# Patient Record
Sex: Female | Born: 1950
Health system: Southern US, Community
[De-identification: ages and names within clinical notes are randomized; demographics above are authoritative.]

## PROBLEM LIST (undated history)

## (undated) DIAGNOSIS — N6019 Diffuse cystic mastopathy of unspecified breast: Secondary | ICD-10-CM

## (undated) DIAGNOSIS — R4586 Emotional lability: Secondary | ICD-10-CM

## (undated) DIAGNOSIS — F419 Anxiety disorder, unspecified: Secondary | ICD-10-CM

## (undated) DIAGNOSIS — E119 Type 2 diabetes mellitus without complications: Secondary | ICD-10-CM

## (undated) HISTORY — PX: TONSILLECTOMY: SUR1361

## (undated) HISTORY — DX: Anxiety disorder, unspecified: F41.9

## (undated) HISTORY — DX: Diffuse cystic mastopathy of unspecified breast: N60.19

## (undated) HISTORY — DX: Emotional lability: R45.86

## (undated) HISTORY — DX: Type 2 diabetes mellitus without complications: E11.9

---
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
## 1987-07-07 HISTORY — PX: BREAST BIOPSY: SHX20

## 1989-07-06 HISTORY — PX: FOOT SURGERY: SHX648

## 1998-10-18 ENCOUNTER — Encounter: Payer: Self-pay | Admitting: Obstetrics and Gynecology

## 1998-10-18 ENCOUNTER — Ambulatory Visit (HOSPITAL_COMMUNITY): Admission: RE | Admit: 1998-10-18 | Discharge: 1998-10-18 | Payer: Self-pay | Admitting: Obstetrics and Gynecology

## 1998-12-25 ENCOUNTER — Other Ambulatory Visit: Admission: RE | Admit: 1998-12-25 | Discharge: 1998-12-25 | Payer: Self-pay | Admitting: Orthopedic Surgery

## 1999-06-12 ENCOUNTER — Encounter: Admission: RE | Admit: 1999-06-12 | Discharge: 1999-06-12 | Payer: Self-pay | Admitting: *Deleted

## 1999-06-12 ENCOUNTER — Encounter: Payer: Self-pay | Admitting: *Deleted

## 1999-10-22 ENCOUNTER — Encounter: Payer: Self-pay | Admitting: Family Medicine

## 1999-10-22 ENCOUNTER — Other Ambulatory Visit: Admission: RE | Admit: 1999-10-22 | Discharge: 1999-10-22 | Payer: Self-pay | Admitting: Family Medicine

## 1999-10-22 ENCOUNTER — Ambulatory Visit (HOSPITAL_COMMUNITY): Admission: RE | Admit: 1999-10-22 | Discharge: 1999-10-22 | Payer: Self-pay | Admitting: Family Medicine

## 2000-11-18 ENCOUNTER — Other Ambulatory Visit: Admission: RE | Admit: 2000-11-18 | Discharge: 2000-11-18 | Payer: Self-pay | Admitting: Family Medicine

## 2003-04-27 ENCOUNTER — Inpatient Hospital Stay (HOSPITAL_COMMUNITY): Admission: EM | Admit: 2003-04-27 | Discharge: 2003-04-29 | Payer: Self-pay | Admitting: Psychiatry

## 2003-06-12 ENCOUNTER — Encounter: Admission: RE | Admit: 2003-06-12 | Discharge: 2003-06-12 | Payer: Self-pay | Admitting: Family Medicine

## 2003-06-19 ENCOUNTER — Ambulatory Visit (HOSPITAL_COMMUNITY): Admission: RE | Admit: 2003-06-19 | Discharge: 2003-06-19 | Payer: Self-pay | Admitting: Family Medicine

## 2003-07-07 HISTORY — PX: THYROIDECTOMY: SHX17

## 2003-08-13 ENCOUNTER — Encounter (INDEPENDENT_AMBULATORY_CARE_PROVIDER_SITE_OTHER): Payer: Self-pay | Admitting: Specialist

## 2003-08-13 ENCOUNTER — Ambulatory Visit (HOSPITAL_COMMUNITY): Admission: RE | Admit: 2003-08-13 | Discharge: 2003-08-13 | Payer: Self-pay | Admitting: Endocrinology

## 2003-10-25 ENCOUNTER — Encounter (INDEPENDENT_AMBULATORY_CARE_PROVIDER_SITE_OTHER): Payer: Self-pay | Admitting: Specialist

## 2003-10-25 ENCOUNTER — Observation Stay (HOSPITAL_COMMUNITY): Admission: RE | Admit: 2003-10-25 | Discharge: 2003-10-26 | Payer: Self-pay | Admitting: Surgery

## 2004-01-04 ENCOUNTER — Other Ambulatory Visit: Admission: RE | Admit: 2004-01-04 | Discharge: 2004-01-04 | Payer: Self-pay | Admitting: Obstetrics and Gynecology

## 2004-07-16 ENCOUNTER — Ambulatory Visit: Payer: Self-pay | Admitting: Family Medicine

## 2005-01-13 ENCOUNTER — Other Ambulatory Visit: Admission: RE | Admit: 2005-01-13 | Discharge: 2005-01-13 | Payer: Self-pay | Admitting: Obstetrics and Gynecology

## 2005-05-14 ENCOUNTER — Ambulatory Visit: Payer: Self-pay | Admitting: Family Medicine

## 2006-01-20 ENCOUNTER — Other Ambulatory Visit: Admission: RE | Admit: 2006-01-20 | Discharge: 2006-01-20 | Payer: Self-pay | Admitting: Obstetrics and Gynecology

## 2006-07-07 ENCOUNTER — Ambulatory Visit: Payer: Self-pay | Admitting: Family Medicine

## 2006-07-07 LAB — CONVERTED CEMR LAB
Free T4: 0.6 ng/dL (ref 0.6–1.6)
T3, Free: 2.6 pg/mL (ref 2.3–4.2)
TSH: 1.48 microintl units/mL (ref 0.35–5.50)

## 2007-06-18 ENCOUNTER — Encounter: Payer: Self-pay | Admitting: Family Medicine

## 2007-07-04 ENCOUNTER — Ambulatory Visit: Payer: Self-pay | Admitting: Internal Medicine

## 2007-07-04 DIAGNOSIS — E890 Postprocedural hypothyroidism: Secondary | ICD-10-CM | POA: Insufficient documentation

## 2007-07-04 DIAGNOSIS — Z9009 Acquired absence of other part of head and neck: Secondary | ICD-10-CM

## 2007-07-04 DIAGNOSIS — R51 Headache: Secondary | ICD-10-CM

## 2007-07-04 DIAGNOSIS — N943 Premenstrual tension syndrome: Secondary | ICD-10-CM | POA: Insufficient documentation

## 2007-07-04 DIAGNOSIS — L408 Other psoriasis: Secondary | ICD-10-CM

## 2007-07-04 DIAGNOSIS — F411 Generalized anxiety disorder: Secondary | ICD-10-CM | POA: Insufficient documentation

## 2007-07-04 DIAGNOSIS — F329 Major depressive disorder, single episode, unspecified: Secondary | ICD-10-CM

## 2007-07-04 DIAGNOSIS — R519 Headache, unspecified: Secondary | ICD-10-CM | POA: Insufficient documentation

## 2007-07-04 DIAGNOSIS — G2581 Restless legs syndrome: Secondary | ICD-10-CM

## 2007-07-08 LAB — CONVERTED CEMR LAB
AST: 29 units/L (ref 0–37)
BUN: 15 mg/dL (ref 6–23)
Basophils Relative: 1.6 % — ABNORMAL HIGH (ref 0.0–1.0)
CO2: 26 meq/L (ref 19–32)
Calcium: 9.3 mg/dL (ref 8.4–10.5)
Creatinine, Ser: 0.8 mg/dL (ref 0.4–1.2)
Monocytes Relative: 7.1 % (ref 3.0–11.0)
Platelets: 283 10*3/uL (ref 150–400)
RBC: 4.45 M/uL (ref 3.87–5.11)
RDW: 12.3 % (ref 11.5–14.6)
Sed Rate: 11 mm/hr (ref 0–25)

## 2007-07-13 ENCOUNTER — Ambulatory Visit: Payer: Self-pay | Admitting: Cardiovascular Disease

## 2007-07-15 ENCOUNTER — Telehealth: Payer: Self-pay | Admitting: Internal Medicine

## 2007-07-25 ENCOUNTER — Ambulatory Visit: Payer: Self-pay | Admitting: Internal Medicine

## 2007-09-17 ENCOUNTER — Encounter: Payer: Self-pay | Admitting: Internal Medicine

## 2007-09-21 ENCOUNTER — Encounter (INDEPENDENT_AMBULATORY_CARE_PROVIDER_SITE_OTHER): Payer: Self-pay | Admitting: *Deleted

## 2007-09-21 ENCOUNTER — Encounter: Payer: Self-pay | Admitting: Internal Medicine

## 2007-11-09 ENCOUNTER — Telehealth (INDEPENDENT_AMBULATORY_CARE_PROVIDER_SITE_OTHER): Payer: Self-pay | Admitting: *Deleted

## 2008-09-24 ENCOUNTER — Emergency Department (HOSPITAL_COMMUNITY): Admission: EM | Admit: 2008-09-24 | Discharge: 2008-09-24 | Payer: Self-pay | Admitting: Emergency Medicine

## 2008-11-19 ENCOUNTER — Encounter: Payer: Self-pay | Admitting: Internal Medicine

## 2008-12-06 ENCOUNTER — Encounter (INDEPENDENT_AMBULATORY_CARE_PROVIDER_SITE_OTHER): Payer: Self-pay | Admitting: *Deleted

## 2009-01-02 ENCOUNTER — Encounter: Payer: Self-pay | Admitting: Internal Medicine

## 2009-01-02 ENCOUNTER — Ambulatory Visit: Payer: Self-pay | Admitting: Internal Medicine

## 2009-01-02 ENCOUNTER — Other Ambulatory Visit: Admission: RE | Admit: 2009-01-02 | Discharge: 2009-01-02 | Payer: Self-pay | Admitting: Internal Medicine

## 2009-01-03 ENCOUNTER — Ambulatory Visit: Payer: Self-pay | Admitting: Internal Medicine

## 2009-01-03 ENCOUNTER — Encounter (INDEPENDENT_AMBULATORY_CARE_PROVIDER_SITE_OTHER): Payer: Self-pay | Admitting: *Deleted

## 2009-01-08 ENCOUNTER — Ambulatory Visit: Payer: Self-pay | Admitting: Internal Medicine

## 2009-01-08 LAB — CONVERTED CEMR LAB: Fecal Occult Bld: POSITIVE

## 2009-01-09 ENCOUNTER — Telehealth: Payer: Self-pay | Admitting: Internal Medicine

## 2009-01-10 LAB — CONVERTED CEMR LAB
AST: 95 units/L — ABNORMAL HIGH (ref 0–37)
BUN: 13 mg/dL (ref 6–23)
Basophils Relative: 0.4 % (ref 0.0–3.0)
CO2: 28 meq/L (ref 19–32)
Chloride: 105 meq/L (ref 96–112)
Cholesterol: 212 mg/dL — ABNORMAL HIGH (ref 0–200)
Creatinine, Ser: 0.8 mg/dL (ref 0.4–1.2)
Eosinophils Absolute: 0.3 10*3/uL (ref 0.0–0.7)
HCT: 40.6 % (ref 36.0–46.0)
Lymphs Abs: 2.1 10*3/uL (ref 0.7–4.0)
MCHC: 35 g/dL (ref 30.0–36.0)
MCV: 88.9 fL (ref 78.0–100.0)
Monocytes Absolute: 0.6 10*3/uL (ref 0.1–1.0)
Neutrophils Relative %: 57.6 % (ref 43.0–77.0)
Potassium: 3.8 meq/L (ref 3.5–5.1)
RBC: 4.57 M/uL (ref 3.87–5.11)
Total CHOL/HDL Ratio: 3
Vit D, 25-Hydroxy: 38 ng/mL (ref 30–89)

## 2009-01-11 ENCOUNTER — Encounter: Payer: Self-pay | Admitting: Internal Medicine

## 2009-01-14 ENCOUNTER — Telehealth (INDEPENDENT_AMBULATORY_CARE_PROVIDER_SITE_OTHER): Payer: Self-pay | Admitting: *Deleted

## 2009-01-17 ENCOUNTER — Ambulatory Visit: Payer: Self-pay | Admitting: Internal Medicine

## 2009-01-22 ENCOUNTER — Encounter: Payer: Self-pay | Admitting: Family Medicine

## 2009-01-22 ENCOUNTER — Ambulatory Visit: Payer: Self-pay | Admitting: Internal Medicine

## 2009-01-25 ENCOUNTER — Telehealth (INDEPENDENT_AMBULATORY_CARE_PROVIDER_SITE_OTHER): Payer: Self-pay | Admitting: *Deleted

## 2009-01-25 LAB — CONVERTED CEMR LAB
HCV Ab: NEGATIVE
Hep B E Ab: NEGATIVE

## 2009-01-29 LAB — CONVERTED CEMR LAB
ALT: 20 units/L (ref 0–35)
AST: 26 units/L (ref 0–37)
Alkaline Phosphatase: 82 units/L (ref 39–117)
Bilirubin, Direct: 0 mg/dL (ref 0.0–0.3)
Total Bilirubin: 0.7 mg/dL (ref 0.3–1.2)

## 2009-01-31 ENCOUNTER — Ambulatory Visit: Payer: Self-pay | Admitting: Internal Medicine

## 2009-02-01 ENCOUNTER — Encounter (INDEPENDENT_AMBULATORY_CARE_PROVIDER_SITE_OTHER): Payer: Self-pay | Admitting: *Deleted

## 2009-02-21 ENCOUNTER — Encounter (INDEPENDENT_AMBULATORY_CARE_PROVIDER_SITE_OTHER): Payer: Self-pay | Admitting: *Deleted

## 2010-02-06 ENCOUNTER — Telehealth: Payer: Self-pay | Admitting: Internal Medicine

## 2010-03-19 ENCOUNTER — Ambulatory Visit: Payer: Self-pay | Admitting: Internal Medicine

## 2010-04-08 ENCOUNTER — Encounter: Payer: Self-pay | Admitting: Internal Medicine

## 2010-06-13 ENCOUNTER — Telehealth (INDEPENDENT_AMBULATORY_CARE_PROVIDER_SITE_OTHER): Payer: Self-pay | Admitting: *Deleted

## 2010-08-05 NOTE — Assessment & Plan Note (Signed)
Summary: cpx/kn/rescd cbs   Vital Signs:  Patient profile:   60 year old female Height:      64 inches Weight:      162.13 pounds BMI:     27.93 Pulse rate:   88 / minute Pulse rhythm:   regular BP sitting:   122 / 80  (left arm) Cuff size:   regular  Vitals Entered By: Army Fossa CMA (March 19, 2010 1:56 PM) CC: CPX? Med Refill? not fasting Comments Pt does not want the CPX, would rather just be seen for Med Refill. Declines PAP. Declines Flu shot. Due for mammogram. Refill on Prozac- uses [facility].    History of Present Illness: as above   Preventive Screening-Counseling & Management  Alcohol-Tobacco     Alcohol type: rare  Current Medications (verified): 1)  Prozac 40 Mg  Caps (Fluoxetine Hcl) .Marland Kitchen.. 1 By Mouth Qd  Allergies (verified): No Known Drug Allergies  Past History:  Past Medical History: Reviewed history from 01/02/2009 and no changes required. MENSTRUAL MIGRAINES RESTLESS LEGS Anxiety-Depression ("I take prozac for mood swings") PSORIASIS menopause since age 42  Past Surgical History: Reviewed history from 07/04/2007 and no changes required. Thyroidectomy RT LOBE Tonsillectomy Morton's neuroma 2000 Breast Bx '89  Social History: Reviewed history from 01/02/2009 and no changes required. Married 1 child Never Smoked Alcohol use- rarely  Drug use-no Regular exercise- no caffeine use - 3 cups daily  Review of Systems Psych:  Denies depression; has RLS, occ diff. falling asleeps  , once she sleeps she is ok  on prozac x years, in the past she tried to decreased dose ( to  every other day  or skipping weeksends) but got anxious ; she still would like to d/c it, no major stressors on her life .  Physical Exam  General:  alert and well-developed.   Lungs:  normal respiratory effort, no intercostal retractions, no accessory muscle use, and normal breath sounds.   Heart:  normal rate, regular rhythm, no murmur, and no gallop.   Psych:   Oriented X3, memory intact for recent and remote, normally interactive, good eye contact, not anxious appearing, and not depressed appearing.     Impression & Recommendations:  Problem # 1:  ANXIETY (ICD-300.00) see review of systems We agreed to decrease fluoxetine dose (40 to 30mg ) and reassess her  condition in 6 months Her updated medication list for this problem includes:    Fluoxetine Hcl 20 Mg Tabs (Fluoxetine hcl) .Marland Kitchen... 1.5 tabs a day  Problem # 2:  HEALTH SCREENING (ICD-V70.0) benefits of flu shot discussed, no h/o allergic reations-- states she will take it also rec a MMG, states will schedule one RTC 6 months, we agreed to do her CPX then   Problem # 3:  RESTLESS LEG SYNDROME (ICD-333.94) not  interested in any treatment at this point  Complete Medication List: 1)  Fluoxetine Hcl 20 Mg Tabs (Fluoxetine hcl) .... 1.5 tabs a day  Other Orders: Admin 1st Vaccine (16109) Flu Vaccine 72yrs + (60454)  Patient Instructions: 1)  Please schedule a follow-up appointment in 6 months .  Prescriptions: FLUOXETINE HCL 20 MG TABS (FLUOXETINE HCL) 1.5 tabs a day  #135 x 1   Entered by:   Army Fossa CMA   Authorized by:   Nolon Rod. Paz MD   Signed by:   Army Fossa CMA on 03/19/2010   Method used:   Electronically to        [facility] (retail)             ,  Ph: [phone]       Fax: [phone]   RxID:   [id number]    Risk Factors:  Alcohol use:  yes    Type:  rare Flu Vaccine Consent Questions     Do you have a history of severe allergic reactions to this vaccine? no    Any prior history of allergic reactions to egg and/or gelatin? no    Do you have a sensitivity to the preservative Thimersol? no    Do you have a past history of Guillan-Barre Syndrome? no    Do you currently have an acute febrile illness? no    Have you ever had a severe reaction to latex? no    Vaccine information given and explained to patient? yes    Are you currently  pregnant? no    Lot Number:[id number]   Exp Date:01/03/2011   Site Given  Left Deltoid IM use:  yes    Type:  rare    .lbflu

## 2010-08-05 NOTE — Progress Notes (Signed)
Summary: Refill Request  Phone Note Refill Request Message from:  Patient on February 06, 2010 9:22 AM  Refills Requested: Medication #1:  PROZAC 40 MG  CAPS 1 by mouth QD   Dosage confirmed as above?Dosage Confirmed   Supply Requested: 1 month [facility]. Pt can be contacted at [phone]  Next Appointment Scheduled: none Initial call taken by: Lavell Islam,  February 06, 2010 9:23 AM  Follow-up for Phone Call        not been seen since June 2010, no pending appts. Tried to call pt to schedule appt, had to leave a message.Army Fossa CMA  February 06, 2010 9:43 AM   Additional Follow-up for Phone Call Additional follow up Details #1::        pt has an appt for a complete physical on 02/26/10 @ 12:40. pt states she will come in fasting.Karoline Caldwell Negrete  February 06, 2010 11:45 AM    Additional Follow-up for Phone Call Additional follow up Details #2::    okay to call Prozac #90 tablets no refill Follow-up by: Nolon Rod. Arriana Lohmann MD,  February 06, 2010 3:50 PM  Prescriptions: PROZAC 40 MG  CAPS (FLUOXETINE HCL) 1 by mouth QD  #90 x 0   Entered by:   Army Fossa CMA   Authorized by:   Nolon Rod. Latresha Yahr MD   Signed by:   Army Fossa CMA on 02/06/2010   Method used:   Faxed to ...       [facility] (mail-order)             , [place]         Ph: [phone]       Fax: [phone]   RxID:   [id number]

## 2010-08-07 NOTE — Progress Notes (Signed)
Summary: medical records  Phone Note Call from Patient Call back at Home Phone [phone]   Caller: Patient Summary of Call: Pt states that she is getting new health coverage so her medical records are needed. Pt indicates that [employer] insurance should have sent a letter over requesting medical records.  Pt would like for [place] to response to this request ASAP because she is to leave the country on next Friday. Pls advise if any request have been received and let pt know................Marland KitchenFelecia Deloach CMA  June 13, 2010 10:49 AM   Checked medical records, no records have been requested...............Marland KitchenFelecia Deloach CMA  June 13, 2010 10:59 AM   Follow-up for Phone Call        I have not seen any letters for the pt. Army Fossa CMA  June 13, 2010 11:01 AM   Additional Follow-up for Phone Call Additional follow up Details #1::        Discuss with patient husband will inform pt. Per pt husband pt will be in touch with insurance to check status of letter and call us back with any further concerns .........Marland KitchenFelecia Deloach CMA  June 13, 2010 11:07 AM      Appended Document: medical records    Phone Note Call from Patient   Caller: other Call placed by: Kalyn Call placed to: Patient Summary of Call: At the time that the request was sent from [facility], we did not have the chart here. The chart is now back from [facility]..( I assume there was a previous request beofre this one.)I spoke with Misty Stanley at [employer] and she told me to send it over and to let the patient know that they could pick it up once it has been scanned. Left Message on pts home phone to call the office ASAP.  Follow-up for Phone Call        Spoke with Renee at [facility]. She advised me that she left a message with the patient letting her know that she could pick up her chart. Follow-up by: Lavell Islam,  June 26, 2010 2:40 PM

## 2010-09-05 ENCOUNTER — Ambulatory Visit (INDEPENDENT_AMBULATORY_CARE_PROVIDER_SITE_OTHER): Payer: BC Managed Care – PPO | Admitting: Internal Medicine

## 2010-09-05 ENCOUNTER — Encounter: Payer: Self-pay | Admitting: Internal Medicine

## 2010-09-05 DIAGNOSIS — L03319 Cellulitis of trunk, unspecified: Secondary | ICD-10-CM

## 2010-09-06 ENCOUNTER — Encounter: Payer: Self-pay | Admitting: Internal Medicine

## 2010-09-08 ENCOUNTER — Encounter: Payer: Self-pay | Admitting: Internal Medicine

## 2010-09-08 ENCOUNTER — Ambulatory Visit (INDEPENDENT_AMBULATORY_CARE_PROVIDER_SITE_OTHER): Payer: BC Managed Care – PPO | Admitting: Internal Medicine

## 2010-09-08 DIAGNOSIS — L03319 Cellulitis of trunk, unspecified: Secondary | ICD-10-CM

## 2010-09-11 NOTE — Assessment & Plan Note (Signed)
Summary: [place] area, possible cyst needing drainage./kb   Vital Signs:  Patient profile:   60 year old female Weight:      159.38 pounds Pulse rate:   78 / minute Pulse rhythm:   regular BP sitting:   126 / 84  (left arm) Cuff size:   regular  Vitals Entered By: Army Fossa CMA (September 05, 2010 1:06 PM) CC: Cyst on (L) shoulder x 1 1/2 weeks  Comments [facility] [address]    History of Present Illness: swelling and pain x 10 days in the L trapezoid area she had a mole removed from there  ~10 years ago, the surgery left a scar which was removed  ~ 5 years ago (per patient). The area looked ok until few days ago  ROS no fever no d/c   Current Medications (verified): 1)  Fluoxetine Hcl 20 Mg Tabs (Fluoxetine Hcl) .... 1.5 Tabs A Day  Allergies (verified): No Known Drug Allergies  Past History:  Past Medical History: Reviewed history from 01/02/2009 and no changes required. MENSTRUAL MIGRAINES RESTLESS LEGS Anxiety-Depression ("I take prozac for mood swings") PSORIASIS menopause since age 9  Past Surgical History: Reviewed history from 07/04/2007 and no changes required. Thyroidectomy RT LOBE Tonsillectomy Morton's neuroma 2000 Breast Bx '89  Physical Exam  General:  alert, well-developed, and well-nourished.   Skin:  at the L trapezoid area has a 1 inch mass consistent w/ an abscess, ++ fluctuancy. Skin is stretchecd but no lesions    Impression & Recommendations:  Problem # 1:  procedure note in a sterile fashion, LA with lidocaine 2% w/o  ~  1cc we did a 1 cm incision, got  ~10cc of purulent fatty material; cyst wall identified it took several minutes to get all the abundant infection and sebaceous material out We packed the cavity culture sent Rx doxy know to put pressure if bleeding see instructions  ( the patient reports that she had a mole years ago in the same area, see HPI, the area will have to be reassessed once he is completely healed  )  Complete Medication List: 1)  Fluoxetine Hcl 20 Mg Tabs (Fluoxetine hcl) .... 1.5 tabs a day 2)  Doxycycline Hyclate 100 Mg Caps (Doxycycline hyclate) .Marland Kitchen.. 1 by mouth two times a day  Other Orders: T-Culture, Wound (87070/[id number]) I&D Abscess, Complex (10061)  Patient Instructions: 1)  keep area clean and dry 2)  take the antibiotics as prescribed 3)  came back Monday 4)  call ot go to a Urgent Care if fever, redness, area look worse Prescriptions: DOXYCYCLINE HYCLATE 100 MG CAPS (DOXYCYCLINE HYCLATE) 1 by mouth two times a day  #20 x 0   Entered and Authorized by:   Nolon Rod. Garris Melhorn MD   Signed by:   Nolon Rod. Albertine Lafoy MD on 09/05/2010   Method used:   Print then Give to Patient   RxID:   [id number]    Orders Added: 1)  T-Culture, Wound [87070/[id number]] 2)  I&D Abscess, Complex [10061]

## 2010-09-16 NOTE — Assessment & Plan Note (Signed)
Summary: followup--wound care///sph   Vital Signs:  Patient profile:   60 year old female Weight:      159.50 pounds Pulse rate:   77 / minute Pulse rhythm:   regular BP sitting:   126 / 80  (left arm) Cuff size:   regular  Vitals Entered By: Army Fossa CMA (September 08, 2010 2:22 PM) CC: Check wound- doing well   History of Present Illness:  followup after incision and drainage of an abscess Doing well No problems reported  Current Medications (verified): 1)  Fluoxetine Hcl 20 Mg Tabs (Fluoxetine Hcl) .... 1.5 Tabs A Day 2)  Doxycycline Hyclate 100 Mg Caps (Doxycycline Hyclate) .Marland Kitchen.. 1 By Mouth Two Times A Day  Allergies (verified): No Known Drug Allergies  Physical Exam  General:  alert and well-developed.   Skin:   area of the surgery  without evidence of cellulitis, packing is removed, no bleeding or further discharge noted.   Impression & Recommendations:  Problem # 1:  CELLULITIS AND ABSCESS OF TRUNK (ICD-682.2)   doing well Packing removed  she had such a hard time during the packing processes that I decided to leave  the wound open  okay to let the shower get the area wet as long as she keeps it clean and dry afterwards.  Will reassess the area a few days when she cames back for a routine visit   she reports that previously he had a mole in the area, I like to see the area again once is well-healed to be sure there is no remaining mole   Her updated medication list for this problem includes:    Doxycycline Hyclate 100 Mg Caps (Doxycycline hyclate) .Marland Kitchen... 1 by mouth two times a day  Orders: No Charge Patient Arrived (NCPA0) (NCPA0)  Complete Medication List: 1)  Fluoxetine Hcl 20 Mg Tabs (Fluoxetine hcl) .... 1.5 tabs a day 2)  Doxycycline Hyclate 100 Mg Caps (Doxycycline hyclate) .Marland Kitchen.. 1 by mouth two times a day   Orders Added: 1)  No Charge Patient Arrived (NCPA0) [NCPA0]

## 2010-09-17 ENCOUNTER — Encounter (INDEPENDENT_AMBULATORY_CARE_PROVIDER_SITE_OTHER): Payer: BC Managed Care – PPO | Admitting: Internal Medicine

## 2010-09-17 ENCOUNTER — Encounter: Payer: Self-pay | Admitting: Internal Medicine

## 2010-09-17 ENCOUNTER — Other Ambulatory Visit: Payer: Self-pay | Admitting: Internal Medicine

## 2010-09-17 DIAGNOSIS — Z0000 Encounter for general adult medical examination without abnormal findings: Secondary | ICD-10-CM

## 2010-09-17 DIAGNOSIS — E785 Hyperlipidemia, unspecified: Secondary | ICD-10-CM

## 2010-09-17 LAB — CBC WITH DIFFERENTIAL/PLATELET
Eosinophils Relative: 6.4 % — ABNORMAL HIGH (ref 0.0–5.0)
HCT: 42 % (ref 36.0–46.0)
Hemoglobin: 14.1 g/dL (ref 12.0–15.0)
Lymphocytes Relative: 26.9 % (ref 12.0–46.0)
Lymphs Abs: 2 10*3/uL (ref 0.7–4.0)
Monocytes Relative: 6.3 % (ref 3.0–12.0)
Neutro Abs: 4.4 10*3/uL (ref 1.4–7.7)
WBC: 7.6 10*3/uL (ref 4.5–10.5)

## 2010-09-17 LAB — HEPATIC FUNCTION PANEL
Albumin: 4.1 g/dL (ref 3.5–5.2)
Total Protein: 6.8 g/dL (ref 6.0–8.3)

## 2010-09-17 LAB — BASIC METABOLIC PANEL
BUN: 14 mg/dL (ref 6–23)
CO2: 29 mEq/L (ref 19–32)
Calcium: 9.5 mg/dL (ref 8.4–10.5)
Chloride: 102 mEq/L (ref 96–112)
Creatinine, Ser: 0.7 mg/dL (ref 0.4–1.2)

## 2010-09-17 LAB — LIPID PANEL
Cholesterol: 225 mg/dL — ABNORMAL HIGH (ref 0–200)
HDL: 63.5 mg/dL (ref >39.00)
Total CHOL/HDL Ratio: 4
VLDL: 24 mg/dL (ref 0.0–40.0)

## 2010-09-17 LAB — LDL CHOLESTEROL, DIRECT: Direct LDL: 142.5 mg/dL

## 2010-09-22 ENCOUNTER — Telehealth: Payer: Self-pay | Admitting: Internal Medicine

## 2010-09-22 LAB — HEMOGLOBIN A1C: Hgb A1c MFr Bld: 6.8 % — ABNORMAL HIGH (ref 4.6–6.5)

## 2010-09-23 ENCOUNTER — Telehealth: Payer: Self-pay | Admitting: *Deleted

## 2010-09-23 DIAGNOSIS — E119 Type 2 diabetes mellitus without complications: Secondary | ICD-10-CM | POA: Insufficient documentation

## 2010-09-23 NOTE — Assessment & Plan Note (Signed)
Summary: physical-PH   Vital Signs:  Patient profile:   60 year old female Weight:      157.25 pounds Pulse rate:   78 / minute Pulse rhythm:   regular BP sitting:   122 / 84  (left arm) Cuff size:   regular  Vitals Entered By: Army Fossa CMA (September 17, 2010 10:09 AM) CC: CPX, fasting  Comments [facility] [address] refill fluoxetine declines pap   History of Present Illness: CPX    Current Medications (verified): 1)  Fluoxetine Hcl 20 Mg Tabs (Fluoxetine Hcl) .... 1.5 Tabs A Day  Allergies (verified): No Known Drug Allergies  Past History:  Past Medical History: Reviewed history from 01/02/2009 and no changes required. MENSTRUAL MIGRAINES RESTLESS LEGS Anxiety-Depression ("I take prozac for mood swings") PSORIASIS menopause since age 21  Past Surgical History: Reviewed history from 07/04/2007 and no changes required. Thyroidectomy RT LOBE Tonsillectomy Morton's neuroma 2000 Breast Bx '89  Social History: Married 1 child Never Smoked Alcohol use- rarely  Drug use-no Regular exercise- active but no exercise  diet-- trying to eat healthy caffeine use - 3 cups daily  Review of Systems General:  Denies fatigue and fever. CV:  Denies chest pain or discomfort and swelling of feet. Resp:  Denies cough and shortness of breath. GI:  Denies bloody stools, nausea, and vomiting. GU:  Denies abnormal vaginal bleeding, dysuria, and hematuria; rarely does SBE .  Physical Exam  General:  alert, well-developed, and well-nourished.   Neck:  no masses and no thyromegaly.   Breasts:  No mass,  thickening, tenderness, bulging, retraction, inflamation, nipple discharge or skin changes noted.  No LADs.   breast tissue is more prominent at 10 oclock (R) and 2 oclock L w/o clear cut mass    Lungs:  normal respiratory effort, no intercostal retractions, no accessory muscle use, and normal breath sounds.   Heart:  normal rate, regular rhythm, no murmur, and no gallop.     Extremities:  no pretibial edema bilaterally  Skin:  area of cyst healing well, no moles or lesions  Psych:  Oriented X3, memory intact for recent and remote, normally interactive, good eye contact, not anxious appearing, and not depressed appearing.     Impression & Recommendations:  Problem # 1:  HEALTH SCREENING (ICD-V70.0) Td 6-10  colonoscopy  7-10 neg, [facility], next 10 years    PAPs @ gyn visit aprox 2004-2005, got one here 6-10 married trwice, never had an abnormal PAP , plan PAPs q 2 or 3 years, will discuss 2013  MMG neg 10-11  Breast exam today (-) but breast tissue is more prominent at 10 oclock (R) and 2 oclock L w/o clear cut mass . To be sure , will get a u/s  encouraged SBE  DEXA 7-10 mild osteopenia, rec Ca and Vit D, exercise  +FH CAD: she is Asx, EKG  NSR, no old EKGs rec ASA 81 once daily, LDL goal  ~100  Orders: Venipuncture (16109) TLB-BMP (Basic Metabolic Panel-BMET) (80048-METABOL) TLB-Hepatic/Liver Function Pnl (80076-HEPATIC) TLB-CBC Platelet - w/Differential (85025-CBCD) TLB-Lipid Panel (80061-LIPID) TLB-TSH (Thyroid Stimulating Hormone) (84443-TSH) T-Vitamin D (25-Hydroxy) (60454-09811) Specimen Handling (91478) EKG w/ Interpretation (93000) Radiology Referral (Radiology)  Problem # 2:  CELLULITIS AND ABSCESS OF TRUNK (ICD-682.2) almost healrd, skin normal w/o evidence of mole The following medications were removed from the medication list:    Doxycycline Hyclate 100 Mg Caps (Doxycycline hyclate) .Marland Kitchen... 1 by mouth two times a day  Problem # 3:  ANXIETY (ICD-300.00)  doing  ~ the same w/ 30 or 40mg  of prozac, still occasionally anxiety episode. to achieve  complete control symptoms she will need more meds or therapy, at this point is not interested, thus no change, continue 30mg   Her updated medication list for this problem includes:    Fluoxetine Hcl 20 Mg Tabs (Fluoxetine hcl) .Marland Kitchen... 1.5 tabs a day  Complete Medication List: 1)  Fluoxetine Hcl  20 Mg Tabs (Fluoxetine hcl) .... 1.5 tabs a day 2)  Aspirin 325 Mg Tabs (Aspirin) .Marland Kitchen.. 1 a day  Patient Instructions: 1)   start aspirin 81 mg one a day 2)  Exercise 3)   breast ultrasound 4)  Please schedule a follow-up appointment in 6 months  Prescriptions: FLUOXETINE HCL 20 MG TABS (FLUOXETINE HCL) 1.5 tabs a day  #135 Tablet x 3   Entered and Authorized by:   Nolon Rod. Ronzell Laban MD   Signed by:   Nolon Rod. Tre Sanker MD on 09/17/2010   Method used:   Print then Give to Patient   RxID:   [id number]    Orders Added: 1)  Venipuncture [14782] 2)  TLB-BMP (Basic Metabolic Panel-BMET) [80048-METABOL] 3)  TLB-Hepatic/Liver Function Pnl [80076-HEPATIC] 4)  TLB-CBC Platelet - w/Differential [85025-CBCD] 5)  TLB-Lipid Panel [80061-LIPID] 6)  TLB-TSH (Thyroid Stimulating Hormone) [84443-TSH] 7)  T-Vitamin D (25-Hydroxy) [95621-30865] 8)  Specimen Handling [99000] 9)  EKG w/ Interpretation [93000] 10)  Radiology Referral [Radiology] 11)  Est. Patient age 47-64 [67]     Preventive Care Screening  Mammogram:    Date:  06/05/2010    Results:  normal- per pt     Risk Factors:  Alcohol use:  no  Mammogram History:     Date of Last Mammogram:  06/05/2010    Results:  normal- per pt

## 2010-09-23 NOTE — Telephone Encounter (Signed)
Message copied by Army Fossa on Tue Sep 23, 2010  4:45 PM ------      Message from: Willow Ora      Created: Tue Sep 23, 2010 12:50 PM       Advised patient,       she has developed diabetes, her test is positive.       at this point, she does not need any medicines, please arrange a nutritionist referral.       needs to gradually go back to exercise 30 min a day       come back in 3 months.

## 2010-10-02 NOTE — Progress Notes (Signed)
----   Converted from flag ---- ---- 09/19/2010 6:19 PM, Britton Perkinson E. Hayli Milligan MD wrote: put "breast mass"  (with a question mark)  ---- 09/19/2010 4:25 PM, Magdalen [facility]atz [facility] wrote: For Breast U/S, insurance will not accept Health Screening for Diagnostic.  Please give another dx code.  Renee ------------------------------

## 2010-10-16 LAB — POCT CARDIAC MARKERS: Myoglobin, poc: 66.1 ng/mL (ref 12–200)

## 2010-10-16 LAB — POCT I-STAT, CHEM 8
BUN: 7 mg/dL (ref 6–23)
Hemoglobin: 14.6 g/dL (ref 12.0–15.0)
Sodium: 144 mEq/L (ref 135–145)
TCO2: 26 mmol/L (ref 0–100)

## 2010-10-16 LAB — DIFFERENTIAL
Basophils Absolute: 0 10*3/uL (ref 0.0–0.1)
Eosinophils Absolute: 0.4 10*3/uL (ref 0.0–0.7)
Eosinophils Relative: 4 % (ref 0–5)
Lymphocytes Relative: 15 % (ref 12–46)

## 2010-10-16 LAB — CBC
Hemoglobin: 13.2 g/dL (ref 12.0–15.0)
MCHC: 33 g/dL (ref 30.0–36.0)
MCV: 90 fL (ref 78.0–100.0)
RBC: 4.44 MIL/uL (ref 3.87–5.11)
RDW: 12.7 % (ref 11.5–15.5)

## 2010-11-17 NOTE — Telephone Encounter (Signed)
Per fax from [facility], they contacted pt & she refused the referral at this time / rh 10-01-10.  [facility]

## 2010-11-21 NOTE — Op Note (Signed)
NAME:  Tonya Tapia, Tonya Tapia                       ACCOUNT NO.:  [id number]   MEDICAL RECORD NO.:  [id number]                   PATIENT TYPE:  OBV   LOCATION:  0484                                 FACILITY:  [facility]   PHYSICIAN:  Abigail Miyamoto, M.D.              DATE OF BIRTH:  Aug 30, 1950   DATE OF PROCEDURE:  10/24/2003  DATE OF DISCHARGE:                                 OPERATIVE REPORT   PREOPERATIVE DIAGNOSIS:  1. Right thyroid nodule.  2. Sebaceous cyst, left posterior shoulder.   POSTOPERATIVE DIAGNOSES:  1. Right thyroid nodule.  2. Sebaceous cyst, left posterior shoulder.   PROCEDURE:  1. Right thyroid lobectomy.  2. Excision of sebaceous cyst, left posterior shoulder.   SURGEON:  Dr. Riley Lam A. Magnus Ivan   ASSISTANT:  Dr. Darnell Level   ANESTHESIA:  General endotracheal.   ESTIMATED BLOOD LOSS:  Minimal.   INDICATIONS:  Tonya Tapia is a 60 year old female, who was found on CT  scan to have a goitrous thyroid with a large dominant nodule on the right  side.  Fine needle aspiration of this revealed follicular cells.  Therefore,  the decision has been made to proceed with right thyroid lobectomy.  She  does have a small sebaceous on her left posterior shoulder which we will  remove at the same time.   FINDINGS:  The patient was found to have a large right thyroid nodule.  Frozen section showed benign follicular tissue; however, carcinoma cannot be  ruled out until permanent sectioning.   PROCEDURE IN DETAIL:  The patient is brought to the operating room and  identified as Tonya Tapia.  She is placed supine on the operating room  table, and general anesthesia was induced.  Her neck was then prepped and  draped in the usual sterile fashion.  Using a #15 blade, a small transverse  incision was made along the lower neck.  This incision was carried down  through the platysma with the electrocautery.  Superior and inferior skin  flaps were then created with the  electrocautery.  The Mahorner retractor was  then brought onto the field and used to retract the skin flaps.  The midline  was then opened up between two large external jugular veins.  This was taken  down to the strap muscles which were then bluntly dissected and retracted.  The underlying thyroid was then easily identified.  Dissection was then  carried out along the thyroid gland.  Blunt dissection was then used to push  the soft tissue off the gland laterally.  The middle vein was identified and  clipped with surgical clips and transected.  The dominant nodule was found  to be posterior and going down in a slightly retrosternal location.  A  finger was used to help elevate this easily out of the posterior sternal  area.  The nodule itself was quite large but smooth.  The superior pole was  then identified  and controlled with 2-0 silk ties as well as surgical clips  and transected.  Care was taken to stay right on thyroid gland as this  dissection was carried out.  Several small vessels to the thyroid were taken  down.  The recurrent laryngeal nerve was tented up into the area of the  nodule but was identified and carefully followed and spared.  The inferior  pole was likewise taken down with the electrocautery as well as surgical  clips.  An inferior parathyroid gland was easily identified as well and left  intact.   The rest of the vessels were dissected free, including the inferior thyroid  artery which was clipped as well.  Again, the nerve was identified and left  in place.  The gland was then further elevated off the trachea with the  electrocautery, and then the midline was finally identified and transected  with the electrocautery as well.  The entire right lobe and the large nodule  which was on a pedicle was then sent to pathology for identification.  Frozen section revealed follicular cells with no evidence of gross cancer at  this time.  The wound was then thoroughly  irrigated with normal saline.  Again, hemostasis appeared to be achieved.  A piece of Surgicel was left  into the thyroid bed where the dissection had taken place.  The midline was  then closed with interrupted 3-0 Vicryl sutures.  The platysma was  reapproximated with interrupted 3-0 Vicryl sutures as well, and the skin was  closed with a 4-0 Monocryl.  Steri-Strips, gauze, and tape were then  applied.  The patient was then placed in a lateral position.  The area of  the sebaceous cyst was prepped.  A small transverse incision was made across  it with a 15 blade, and the sebaceous cyst was excised in its entirety.  The  wound was closed with a single 4-0 Monocryl suture and then Steri-Strips and  gauze.  The patient tolerated the procedure well.  All counts were correct  at the end of the procedure.  The patient was then extubated in the  operating room and taken in stable condition to the recovery room.                                               Abigail Miyamoto, M.D.    DB/MEDQ  D:  10/25/2003  T:  10/25/2003  Job:  [id number]   cc:   Angelena Sole, M.D. [facility]

## 2011-01-05 ENCOUNTER — Telehealth: Payer: Self-pay | Admitting: Internal Medicine

## 2011-01-05 NOTE — Telephone Encounter (Signed)
Advise patient: Due for OV

## 2011-01-06 NOTE — Telephone Encounter (Signed)
Patient called back and indicated that she received message about appointment due, patient asked for what? I informed patient for a DM f/u, patient stated she was not going to schedule an appointment. I repeated back to the patient " Ma'ma your Dr. Recommended a DM f/u appointment and you do not want to schedule an appointment?" patient said "Correct I am not scheduling a follow-up appointment and have a good day." phone call was ended.

## 2014-05-24 LAB — HM DIABETES EYE EXAM

## 2014-12-21 ENCOUNTER — Encounter: Payer: Self-pay | Admitting: Internal Medicine

## 2015-01-28 ENCOUNTER — Telehealth: Payer: Self-pay | Admitting: Behavioral Health

## 2015-01-28 ENCOUNTER — Encounter: Payer: Self-pay | Admitting: Behavioral Health

## 2015-01-28 NOTE — Telephone Encounter (Signed)
Pre-Visit Call completed with patient and chart updated.   Pre-Visit Info documented in Specialty Comments under [employer].    

## 2015-01-29 ENCOUNTER — Encounter: Payer: Self-pay | Admitting: Physician Assistant

## 2015-01-29 ENCOUNTER — Ambulatory Visit (INDEPENDENT_AMBULATORY_CARE_PROVIDER_SITE_OTHER): Payer: BLUE CROSS/BLUE SHIELD | Admitting: Physician Assistant

## 2015-01-29 VITALS — BP 136/76 | HR 69 | Temp 97.6°F | Ht 63.0 in | Wt 165.2 lb

## 2015-01-29 DIAGNOSIS — F419 Anxiety disorder, unspecified: Secondary | ICD-10-CM

## 2015-01-29 DIAGNOSIS — IMO0002 Reserved for concepts with insufficient information to code with codable children: Secondary | ICD-10-CM

## 2015-01-29 DIAGNOSIS — Z1239 Encounter for other screening for malignant neoplasm of breast: Secondary | ICD-10-CM | POA: Diagnosis not present

## 2015-01-29 DIAGNOSIS — F418 Other specified anxiety disorders: Secondary | ICD-10-CM

## 2015-01-29 DIAGNOSIS — F32A Depression, unspecified: Secondary | ICD-10-CM | POA: Insufficient documentation

## 2015-01-29 DIAGNOSIS — E1165 Type 2 diabetes mellitus with hyperglycemia: Secondary | ICD-10-CM

## 2015-01-29 DIAGNOSIS — F329 Major depressive disorder, single episode, unspecified: Secondary | ICD-10-CM | POA: Insufficient documentation

## 2015-01-29 DIAGNOSIS — Z23 Encounter for immunization: Secondary | ICD-10-CM

## 2015-01-29 DIAGNOSIS — E119 Type 2 diabetes mellitus without complications: Secondary | ICD-10-CM | POA: Insufficient documentation

## 2015-01-29 LAB — URINALYSIS, ROUTINE W REFLEX MICROSCOPIC
BILIRUBIN URINE: NEGATIVE
HGB URINE DIPSTICK: NEGATIVE
Ketones, ur: NEGATIVE
Nitrite: NEGATIVE
RBC / HPF: NONE SEEN (ref 0–?)
Specific Gravity, Urine: 1.02 (ref 1.000–1.030)
Total Protein, Urine: NEGATIVE
URINE GLUCOSE: 500 — AB
UROBILINOGEN UA: 0.2 (ref 0.0–1.0)
pH: 6 (ref 5.0–8.0)

## 2015-01-29 LAB — BASIC METABOLIC PANEL
BUN: 15 mg/dL (ref 6–23)
CHLORIDE: 101 meq/L (ref 96–112)
CO2: 25 mEq/L (ref 19–32)
CREATININE: 0.79 mg/dL (ref 0.40–1.20)
Calcium: 10.3 mg/dL (ref 8.4–10.5)
GFR: 77.85 mL/min (ref >60.00)
GLUCOSE: 227 mg/dL — AB (ref 70–99)
POTASSIUM: 4 meq/L (ref 3.5–5.1)
Sodium: 137 mEq/L (ref 135–145)

## 2015-01-29 LAB — TSH: TSH: 1.5 u[IU]/mL (ref 0.35–4.50)

## 2015-01-29 LAB — MICROALBUMIN / CREATININE URINE RATIO
Creatinine,U: 138.4 mg/dL
MICROALB UR: 2.4 mg/dL — AB (ref 0.0–1.9)
Microalb Creat Ratio: 1.7 mg/g (ref 0.0–30.0)

## 2015-01-29 LAB — HEMOGLOBIN A1C: Hgb A1c MFr Bld: 9.2 % — ABNORMAL HIGH (ref 4.6–6.5)

## 2015-01-29 MED ORDER — ZOSTER VACCINE LIVE 19400 UNT/0.65ML ~~LOC~~ SOLR: 0.6500 mL | Freq: Once | SUBCUTANEOUS | Status: DC

## 2015-01-29 MED ORDER — FLUOXETINE HCL 40 MG PO CAPS: 40.0000 mg | ORAL_CAPSULE | Freq: Every day | ORAL | Status: DC

## 2015-01-29 MED ORDER — METFORMIN HCL ER (OSM) 1000 MG PO TB24: 1000.0000 mg | ORAL_TABLET | Freq: Every day | ORAL | Status: DC

## 2015-01-29 NOTE — Assessment & Plan Note (Signed)
Last in 2012. Patient refusing further mammograms or breast cancer screening. Advised her this is AGAINST MEDICAL ADVICE. Refusal  noted in Metrics tab. Will continue yearly breast exams.

## 2015-01-29 NOTE — Progress Notes (Signed)
Pre visit review using our clinic review tool, if applicable. No additional management support is needed unless otherwise documented below in the visit note. 

## 2015-01-29 NOTE — Progress Notes (Signed)
Patient presents to clinic today to establish care.  Acute Concerns: Patient denies acute concerns at today's visit. Is needing medication refills of chronic medications.  Chronic Issues: Diabetes mellitus, II --  patient endorses diagnosis one year ago. Was started on metformin ER 500 mg once daily. Has not had follow-up since then. Patient endorses fasting blood sugars averaging greater than 200 daily. Denies symptoms of neuropathy. Denies vision changes. Denies history of nephropathy. Is overdue for repeat assessment of diabetes. Endorses she has never had a foot examination. Is followed by Verda Cumins  and endorses up-to-date on diabetic eye examination.   Anxiety and depression -- patient endorses well controlled with fluoxetine 40 mg daily. Prescription for Xanax 0.25 mg. Endorses she may take 1 tablet a month. Denies panic attack, depressed mood or anhedonia while medication. Denies suicidal thought or ideation.   Patient overdue for Zostavax immunization. Has had chickenpox as a child. Patient  due for mammogram, but refuses further mammography. Noted in chart.   Past Medical History  Diagnosis Date  . Diabetes mellitus without complication     per pt. borderline diabetic  . Mood swings   . Anxiety   . Fibrocystic disease of breast     Past Surgical History  Procedure Laterality Date  . Tonsillectomy    . Thyroidectomy Right 2005  . Breast biopsy  1989  . Foot surgery  1991  . Pilonidal cyst excision  1971    Current Outpatient Prescriptions on File Prior to Visit  Medication Sig Dispense Refill  . ALPRAZolam (XANAX) 0.25 MG tablet Take 0.25 mg by mouth every 12 (twelve) hours as needed for anxiety.     No current facility-administered medications on file prior to visit.    No Known Allergies  Family History  Problem Relation Age of Onset  . Stroke Mother   . Heart attack Father   . Alzheimer's disease Sister     History   Social History  . Marital Status:  Married    Spouse Name: N/A  . Number of Children: 1  . Years of Education: N/A   Occupational History  . Statistician    Social History Main Topics  . Smoking status: Never Smoker   . Smokeless tobacco: Never Used  . Alcohol Use: 0.0 oz/week    0 Standard drinks or equivalent per week     Comment: occas.  . Drug Use: No  . Sexual Activity:    Partners: Male   Other Topics Concern  . Not on file   Social History Narrative   Review of Systems  Constitutional: Negative for fever and weight loss.  HENT: Negative for ear discharge, ear pain, hearing loss and tinnitus.   Eyes: Negative for blurred vision, double vision, photophobia and pain.  Respiratory: Negative for cough and shortness of breath.   Cardiovascular: Negative for chest pain and palpitations.  Gastrointestinal: Negative for heartburn, nausea, vomiting, abdominal pain, diarrhea, constipation, blood in stool and melena.  Genitourinary: Negative for dysuria, urgency, frequency, hematuria and flank pain.  Musculoskeletal: Negative for falls.  Neurological: Negative for dizziness, loss of consciousness and headaches.  Endo/Heme/Allergies: Negative for environmental allergies.  Psychiatric/Behavioral: Negative for depression, suicidal ideas, hallucinations and substance abuse. The patient is not nervous/anxious and does not have insomnia.    BP 136/76 mmHg  Pulse 69  Temp(Src) 97.6 F (36.4 C) (Oral)  Ht 5\' 3"  (1.6 m)  Wt 165 lb 3.2 oz (74.934 kg)  BMI 29.27 kg/m2  SpO2 97%  Physical Exam  Constitutional: She is oriented to person, place, and time and well-developed, well-nourished, and in no distress.  HENT:  Head: Normocephalic and atraumatic.  Eyes: Conjunctivae are normal.  Neck: Neck supple.  Cardiovascular: Normal rate, regular rhythm, normal heart sounds and intact distal pulses.   Pulmonary/Chest: Effort normal and breath sounds normal. No respiratory distress. She has no wheezes. She has no rales.  She exhibits no tenderness.  Neurological: She is alert and oriented to person, place, and time.  Skin: Skin is warm and dry. No rash noted.  Psychiatric: Affect normal.  Vitals reviewed.  Diabetic Foot Form - Detailed   Diabetic Foot Exam - detailed  Diabetic Foot exam was performed with the following findings:  Yes 01/29/2015  1:03 PM  Visual Foot Exam completed.:  Yes  Is there a history of foot ulcer?:  No  Can the patient see the bottom of their feet?:  Yes  Are the shoes appropriate in style and fit?:  Yes  Is there swelling or and abnormal foot shape?:  No  Are the toenails long?:  No  Are the toenails thick?:  No  Do you have pain in calf while walking?:  No  Is there a claw toe deformity?:  No  Is there elevated skin temparature?:  No  Is there limited skin dorsiflexion?:  No  Is there foot or ankle muscle weakness?:  No  Are the toenails ingrown?:  No  Normal Range of Motion:  Yes    Pulse Foot Exam completed.:  Yes  Right posterior Tibialias:  Present Left posterior Tibialias:  Present  Right Dorsalis Pedis:  Present Left Dorsalis Pedis:  Present  Sensory Foot Exam Completed.:  Yes  Swelling:  No  Semmes-Weinstein Monofilament Test  R Foot Test Control:  Neg L Foot Test Control:  Neg  R Site 1-Great Toe:  Neg L Site 1-Great Toe:  Neg  R Site 4:  Neg L Site 4:  Neg  R Site 5:  Neg L Site 5:  Neg        No results found for this or any previous visit (from the past 2160 hour(s)).  Assessment/Plan: Diabetes mellitus type II, uncontrolled Based on CBGs reported, uncontrolled. Patient not currently on ACE inhibitor. Blood pressure stable. Will obtain BMP, A1c, urinalysis and urine microalbumin today. Will increase metformin 2000 mg extended release once daily. We'll alter this further based on A1c results. Foot examination performed and without abnormal findings. Patient up-to-date on ophthalmology examination. Will follow-up every 3 months until glycemic control is  reached.  Anxiety and depression Well-controlled. Continue current regimen. Fluoxetine refill. Patient is aware that when she is due for refill of Xanax she will have to sign a controlled substance contract.  Need for Zostavax administration Rx Zostavax printed. Patient to call insurance representative to see where it is cheapest to have immunization.  Breast cancer screening Last in 2012. Patient refusing further mammograms or breast cancer screening. Advised her this is AGAINST MEDICAL ADVICE. Refusal  noted in Metrics tab. Will continue yearly breast exams.

## 2015-01-29 NOTE — Assessment & Plan Note (Signed)
Well-controlled. Continue current regimen. Fluoxetine refill. Patient is aware that when she is due for refill of Xanax she will have to sign a controlled substance contract.

## 2015-01-29 NOTE — Assessment & Plan Note (Signed)
Rx Zostavax printed. Patient to call insurance representative to see where it is cheapest to have immunization.

## 2015-01-29 NOTE — Assessment & Plan Note (Signed)
Based on CBGs reported, uncontrolled. Patient not currently on ACE inhibitor. Blood pressure stable. Will obtain BMP, A1c, urinalysis and urine microalbumin today. Will increase metformin 2000 mg extended release once daily. We'll alter this further based on A1c results. Foot examination performed and without abnormal findings. Patient up-to-date on ophthalmology examination. Will follow-up every 3 months until glycemic control is reached.

## 2015-01-29 NOTE — Patient Instructions (Signed)
Please continue medications as directed with the following exception:   -- I have increased your Metformin ER from 500 mg to 1000 mg once daily. You can take two of your 500 mg tablets once daily until old prescription expires. The new prescription will be for a 1000 mg tablet to take once daily.  Please stop by the lab for blood work. I will call you with your results. We will schedule follow-up based on lab results.  Diabetes and Exercise Exercising regularly is important. It is not just about losing weight. It has many health benefits, such as:  Improving your overall fitness, flexibility, and endurance.  Increasing your bone density.  Helping with weight control.  Decreasing your body fat.  Increasing your muscle strength.  Reducing stress and tension.  Improving your overall health. People with diabetes who exercise gain additional benefits because exercise:  Reduces appetite.  Improves the body's use of blood sugar (glucose).  Helps lower or control blood glucose.  Decreases blood pressure.  Helps control blood lipids (such as cholesterol and triglycerides).  Improves the body's use of the hormone insulin by:  Increasing the body's insulin sensitivity.  Reducing the body's insulin needs.  Decreases the risk for heart disease because exercising:  Lowers cholesterol and triglycerides levels.  Increases the levels of good cholesterol (such as high-density lipoproteins [HDL]) in the body.  Lowers blood glucose levels. YOUR ACTIVITY PLAN  Choose an activity that you enjoy and set realistic goals. Your health care provider or diabetes educator can help you make an activity plan that works for you. Exercise regularly as directed by your health care provider. This includes:  Performing resistance training twice a week such as push-ups, sit-ups, lifting weights, or using resistance bands.  Performing 150 minutes of cardio exercises each week such as walking, running,  or playing sports.  Staying active and spending no more than 90 minutes at one time being inactive. Even short bursts of exercise are good for you. Three 10-minute sessions spread throughout the day are just as beneficial as a single 30-minute session. Some exercise ideas include:  Taking the dog for a walk.  Taking the stairs instead of the elevator.  Dancing to your favorite song.  Doing an exercise video.  Doing your favorite exercise with a friend. RECOMMENDATIONS FOR EXERCISING WITH TYPE 1 OR TYPE 2 DIABETES   Check your blood glucose before exercising. If blood glucose levels are greater than 240 mg/dL, check for urine ketones. Do not exercise if ketones are present.  Avoid injecting insulin into areas of the body that are going to be exercised. For example, avoid injecting insulin into:  The arms when playing tennis.  The legs when jogging.  Keep a record of:  Food intake before and after you exercise.  Expected peak times of insulin action.  Blood glucose levels before and after you exercise.  The type and amount of exercise you have done.  Review your records with your health care provider. Your health care provider will help you to develop guidelines for adjusting food intake and insulin amounts before and after exercising.  If you take insulin or oral hypoglycemic agents, watch for signs and symptoms of hypoglycemia. They include:  Dizziness.  Shaking.  Sweating.  Chills.  Confusion.  Drink plenty of water while you exercise to prevent dehydration or heat stroke. Body water is lost during exercise and must be replaced.  Talk to your health care provider before starting an exercise program to make sure  it is safe for you. Remember, almost any type of activity is better than none. Document Released: 09/12/2003 Document Revised: 11/06/2013 Document Reviewed: 11/29/2012 [facility] Patient Information 2015 [place], [place]. This information is not intended to  replace advice given to you by your health care provider. Make sure you discuss any questions you have with your health care provider.

## 2015-01-30 ENCOUNTER — Telehealth: Payer: Self-pay | Admitting: Physician Assistant

## 2015-01-30 MED ORDER — METFORMIN HCL 500 MG PO TABS: ORAL_TABLET | ORAL | Status: DC

## 2015-01-30 NOTE — Telephone Encounter (Addendum)
New rx for Metformin XL 500mg -Take 2 tablet by mouth daily-sent to the pharmacy by e-script.//AB/CMA

## 2015-01-30 NOTE — Telephone Encounter (Signed)
Ok to change and resend.

## 2015-01-30 NOTE — Telephone Encounter (Signed)
Caller name:carolyn Relationship to patient:self Can be reached: Pharmacy:  Reason for call:metformin rx went to 1000mg  on this last rx.    This rx will cost her $400.  She needs to go back to the 500mg  and take 2.  It costs her $10 a month for the 500 mg

## 2015-02-01 ENCOUNTER — Telehealth: Payer: Self-pay | Admitting: *Deleted

## 2015-02-01 ENCOUNTER — Telehealth: Payer: Self-pay | Admitting: Physician Assistant

## 2015-02-01 MED ORDER — METFORMIN HCL ER 500 MG PO TB24: ORAL_TABLET | ORAL | Status: DC

## 2015-02-01 MED ORDER — RAMIPRIL 2.5 MG PO CAPS: 2.5000 mg | ORAL_CAPSULE | Freq: Every day | ORAL | Status: DC

## 2015-02-01 NOTE — Telephone Encounter (Signed)
-----   Message from Brunetta Jeans, PA-C sent at 01/30/2015  7:10 AM EDT ----- A1C at 9.2 (Goal < 7).  Want her to continue new dose of Metformin discussed at yesterday's visit. Continue fasting blood sugar checks. Follow-up in 6 weeks so we can review how numbers are decerasing. Also sugar is starting to affect kidneys. Since her BP was so good we will start a low dose of a medication called ramipril. She is to take once daily. Ok to send in Rx Ramipril 2.5 mg once daily. Quantity 30 w 3 refills.  Again follow-up 6 weeks.

## 2015-02-01 NOTE — Telephone Encounter (Signed)
Will note in her chart.

## 2015-02-01 NOTE — Telephone Encounter (Signed)
Called and spoke with the pt and informed her of recent lab results and note.  Pt verbalized understanding and agreed.  Informed the pt that we received her message regarding the Metformin been to much ,so a new prescription for Metformin 500mg  XL was sent to the pharmacy.  She's to take 2 daily.  Also new prescription for the Ramipril 2.5mg  sent to the pharmacy by e-script.  Pt stated that she will call back to schedule a follow-up appt for 6 weeks.//AB/CMA

## 2015-02-01 NOTE — Telephone Encounter (Signed)
Relation to pt: self Call back number: [phone] Pharmacy: [id number] - [place], [place] - [address] AT [address] [phone] (Phone) [phone] (Fax)         Reason for call:  patient was advised to call back with the brand name of test strips: the name is True Track. Patient states she has one box left she is good for now. Thank you

## 2015-03-15 ENCOUNTER — Ambulatory Visit: Payer: BLUE CROSS/BLUE SHIELD | Admitting: Physician Assistant

## 2015-03-17 ENCOUNTER — Other Ambulatory Visit: Payer: Self-pay | Admitting: Physician Assistant

## 2015-03-18 NOTE — Telephone Encounter (Signed)
Rx faxed to pharmacy/[facility] 

## 2015-03-19 ENCOUNTER — Encounter: Payer: Self-pay | Admitting: Physician Assistant

## 2015-03-19 ENCOUNTER — Ambulatory Visit (INDEPENDENT_AMBULATORY_CARE_PROVIDER_SITE_OTHER): Payer: BLUE CROSS/BLUE SHIELD | Admitting: Physician Assistant

## 2015-03-19 VITALS — BP 126/64 | HR 70 | Temp 98.3°F | Resp 16 | Ht 63.0 in | Wt 165.5 lb

## 2015-03-19 DIAGNOSIS — E1165 Type 2 diabetes mellitus with hyperglycemia: Secondary | ICD-10-CM | POA: Diagnosis not present

## 2015-03-19 DIAGNOSIS — IMO0002 Reserved for concepts with insufficient information to code with codable children: Secondary | ICD-10-CM

## 2015-03-19 LAB — BASIC METABOLIC PANEL
BUN: 15 mg/dL (ref 6–23)
CHLORIDE: 101 meq/L (ref 96–112)
CO2: 29 meq/L (ref 19–32)
Calcium: 9.8 mg/dL (ref 8.4–10.5)
Creatinine, Ser: 0.71 mg/dL (ref 0.40–1.20)
GFR: 88.02 mL/min (ref >60.00)
Glucose, Bld: 207 mg/dL — ABNORMAL HIGH (ref 70–99)
Potassium: 3.9 mEq/L (ref 3.5–5.1)
SODIUM: 139 meq/L (ref 135–145)

## 2015-03-19 NOTE — Patient Instructions (Signed)
Please take the Metformin as directed: -- Continue 2 tablets (1000 mg) each morning. -- Add on 1 tablet (500) mg each evening. After 2 weeks, increase to 2 tablets (1000 mg) each evening.  Continue checking sugars each morning. Our goal is sugars in the range of 80-130.   Stay active and watch bedtime snacking. Follow the meal planning guide given as this will help you put together the right proportions of your food groups.  Follow-up at the beginning of November.  Diabetes and Exercise Exercising regularly is important. It is not just about losing weight. It has many health benefits, such as:  Improving your overall fitness, flexibility, and endurance.  Increasing your bone density.  Helping with weight control.  Decreasing your body fat.  Increasing your muscle strength.  Reducing stress and tension.  Improving your overall health. People with diabetes who exercise gain additional benefits because exercise:  Reduces appetite.  Improves the body's use of blood sugar (glucose).  Helps lower or control blood glucose.  Decreases blood pressure.  Helps control blood lipids (such as cholesterol and triglycerides).  Improves the body's use of the hormone insulin by:  Increasing the body's insulin sensitivity.  Reducing the body's insulin needs.  Decreases the risk for heart disease because exercising:  Lowers cholesterol and triglycerides levels.  Increases the levels of good cholesterol (such as high-density lipoproteins [HDL]) in the body.  Lowers blood glucose levels. YOUR ACTIVITY PLAN  Choose an activity that you enjoy and set realistic goals. Your health care provider or diabetes educator can help you make an activity plan that works for you. Exercise regularly as directed by your health care provider. This includes:  Performing resistance training twice a week such as push-ups, sit-ups, lifting weights, or using resistance bands.  Performing 150 minutes of  cardio exercises each week such as walking, running, or playing sports.  Staying active and spending no more than 90 minutes at one time being inactive. Even short bursts of exercise are good for you. Three 10-minute sessions spread throughout the day are just as beneficial as a single 30-minute session. Some exercise ideas include:  Taking the dog for a walk.  Taking the stairs instead of the elevator.  Dancing to your favorite song.  Doing an exercise video.  Doing your favorite exercise with a friend. RECOMMENDATIONS FOR EXERCISING WITH TYPE 1 OR TYPE 2 DIABETES   Check your blood glucose before exercising. If blood glucose levels are greater than 240 mg/dL, check for urine ketones. Do not exercise if ketones are present.  Avoid injecting insulin into areas of the body that are going to be exercised. For example, avoid injecting insulin into:  The arms when playing tennis.  The legs when jogging.  Keep a record of:  Food intake before and after you exercise.  Expected peak times of insulin action.  Blood glucose levels before and after you exercise.  The type and amount of exercise you have done.  Review your records with your health care provider. Your health care provider will help you to develop guidelines for adjusting food intake and insulin amounts before and after exercising.  If you take insulin or oral hypoglycemic agents, watch for signs and symptoms of hypoglycemia. They include:  Dizziness.  Shaking.  Sweating.  Chills.  Confusion.  Drink plenty of water while you exercise to prevent dehydration or heat stroke. Body water is lost during exercise and must be replaced.  Talk to your health care provider before starting an  exercise program to make sure it is safe for you. Remember, almost any type of activity is better than none. Document Released: 09/12/2003 Document Revised: 11/06/2013 Document Reviewed: 11/29/2012 [facility] Patient Information 2015  [place], [place]. This information is not intended to replace advice given to you by your health care provider. Make sure you discuss any questions you have with your health care provider.

## 2015-03-19 NOTE — Assessment & Plan Note (Signed)
Will repeat BMP today. Will increase Metformin to 1000 mg AM and 500 mg PM for 2 weeks. Then increase to 1000 mg PM if fasting glucose not at 80-130. Meal planning reviewed and handout given. Exercise reviewed. Follow-up early November for assessment and repeat A1C

## 2015-03-19 NOTE — Progress Notes (Signed)
Pre visit review using our clinic review tool, if applicable. No additional management support is needed unless otherwise documented below in the visit note/SLS  

## 2015-03-19 NOTE — Progress Notes (Signed)
Patient presents to clinic today for follow-up of DM II with evidence of mild nephropathy on testing. Is currently taking Metformin ER 1000 mg once daily. Endorses fasting sugars averaging 200-300. Denies polyuria, polydipsia or polyphagia. Patient trying to increase exercise regimen. Is not eating well per patient.  Past Medical History  Diagnosis Date  . Diabetes mellitus without complication     per pt. borderline diabetic  . Mood swings   . Anxiety   . Fibrocystic disease of breast     Current Outpatient Prescriptions on File Prior to Visit  Medication Sig Dispense Refill  . ALPRAZolam (XANAX) 0.25 MG tablet Take 0.25 mg by mouth every 12 (twelve) hours as needed for anxiety.    Marland Kitchen aspirin 81 MG EC tablet Take 81 mg by mouth daily.    . Blood Glucose Monitoring Suppl (TRUETRACK BLOOD GLUCOSE) W/DEVICE KIT CHECK BLOOD SUGAR FASTING AND TWO HOURS AFTER HER LARGEST MEAL.    . calcium carbonate (OS-CAL) 600 MG TABS tablet Take 2 tables by mouth in the morning.    Marland Kitchen FLUoxetine (PROZAC) 40 MG capsule Take 1 capsule (40 mg total) by mouth daily. 90 capsule 1  . metFORMIN (GLUCOPHAGE-XR) 500 MG 24 hr tablet TAKE 2 TABLETS BY MOUTH DAILY 180 tablet 0  . OVER THE COUNTER MEDICATION Multiple Vitamins-Minerals-Take 1 tablet by mouth daily.    . ramipril (ALTACE) 2.5 MG capsule Take 1 capsule (2.5 mg total) by mouth daily. 30 capsule 3   No current facility-administered medications on file prior to visit.    No Known Allergies  Family History  Problem Relation Age of Onset  . Stroke Mother   . Heart attack Father   . Alzheimer's disease Sister     Social History   Social History  . Marital Status: Married    Spouse Name: N/A  . Number of Children: 1  . Years of Education: N/A   Occupational History  . Statistician    Social History Main Topics  . Smoking status: Never Smoker   . Smokeless tobacco: Never Used  . Alcohol Use: 0.0 oz/week    0 Standard drinks or equivalent  per week     Comment: occas.  . Drug Use: No  . Sexual Activity:    Partners: Male   Other Topics Concern  . None   Social History Narrative    Review of Systems - See HPI.  All other ROS are negative.  BP 126/64 mmHg  Pulse 70  Temp(Src) 98.3 F (36.8 C) (Oral)  Resp 16  Ht $R'5\' 3"'qX$  (1.6 m)  Wt 165 lb 8 oz (75.07 kg)  BMI 29.32 kg/m2  SpO2 98%  Physical Exam  Constitutional: She is oriented to person, place, and time and well-developed, well-nourished, and in no distress.  HENT:  Head: Normocephalic and atraumatic.  Cardiovascular: Normal rate, regular rhythm, normal heart sounds and intact distal pulses.   Pulmonary/Chest: Effort normal.  Neurological: She is alert and oriented to person, place, and time.  Skin: Skin is warm and dry. No rash noted.  Psychiatric: Affect normal.  Vitals reviewed.   Recent Results (from the past 2160 hour(s))  Basic Metabolic Panel (BMET)     Status: Abnormal   Collection Time: 01/29/15  9:17 AM  Result Value Ref Range   Sodium 137 135 - 145 mEq/L   Potassium 4.0 3.5 - 5.1 mEq/L   Chloride 101 96 - 112 mEq/L   CO2 25 19 - 32 mEq/L  Glucose, Bld 227 (H) 70 - 99 mg/dL   BUN 15 6 - 23 mg/dL   Creatinine, Ser 0.79 0.40 - 1.20 mg/dL   Calcium 10.3 8.4 - 10.5 mg/dL   GFR 77.85 >60.00 mL/min  Hemoglobin A1c     Status: Abnormal   Collection Time: 01/29/15  9:17 AM  Result Value Ref Range   Hgb A1c MFr Bld 9.2 (H) 4.6 - 6.5 %    Comment: Glycemic Control Guidelines for People with Diabetes:Non Diabetic:  <6%Goal of Therapy: <7%Additional Action Suggested:  >8%   TSH     Status: None   Collection Time: 01/29/15  9:17 AM  Result Value Ref Range   TSH 1.50 0.35 - 4.50 uIU/mL  Microalbumin / creatinine urine ratio     Status: Abnormal   Collection Time: 01/29/15  9:17 AM  Result Value Ref Range   Microalb, Ur 2.4 (H) 0.0 - 1.9 mg/dL   Creatinine,U 138.4 mg/dL   Microalb Creat Ratio 1.7 0.0 - 30.0 mg/g  Urinalysis, Routine w reflex  microscopic (not at [facility])     Status: Abnormal   Collection Time: 01/29/15  9:17 AM  Result Value Ref Range   Color, Urine YELLOW Yellow;Lt. Yellow   APPearance CLEAR Clear   Specific Gravity, Urine 1.020 1.000-1.030   pH 6.0 5.0 - 8.0   Total Protein, Urine NEGATIVE Negative   Urine Glucose 500 (A) Negative   Ketones, ur NEGATIVE Negative   Bilirubin Urine NEGATIVE Negative   Hgb urine dipstick NEGATIVE Negative   Urobilinogen, UA 0.2 0.0 - 1.0   Leukocytes, UA TRACE (A) Negative   Nitrite NEGATIVE Negative   WBC, UA 3-6/hpf (A) 0-2/hpf   RBC / HPF none seen 0-2/hpf   Squamous Epithelial / LPF Rare(0-4/hpf) Rare(0-4/hpf)    Assessment/Plan: Diabetes mellitus type II, uncontrolled Will repeat BMP today. Will increase Metformin to 1000 mg AM and 500 mg PM for 2 weeks. Then increase to 1000 mg PM if fasting glucose not at 80-130. Meal planning reviewed and handout given. Exercise reviewed. Follow-up early November for assessment and repeat A1C

## 2015-05-01 ENCOUNTER — Telehealth: Payer: Self-pay | Admitting: Internal Medicine

## 2015-05-03 ENCOUNTER — Other Ambulatory Visit: Payer: Self-pay | Admitting: Physician Assistant

## 2015-05-03 MED ORDER — METFORMIN HCL ER 500 MG PO TB24: 1000.0000 mg | ORAL_TABLET | Freq: Every day | ORAL | Status: DC

## 2015-05-03 MED ORDER — METFORMIN HCL ER 500 MG PO TB24: 1000.0000 mg | ORAL_TABLET | Freq: Two times a day (BID) | ORAL | Status: DC

## 2015-05-03 NOTE — Telephone Encounter (Signed)
LVM advising pt.

## 2015-05-03 NOTE — Telephone Encounter (Signed)
Pt says per her last appt w/ provider she is to now take 4 tablets a day. Pt says that the incorrect Rx and/or amount was sent in to the pharmacy for the METFORMIN Rx.     Please advise.     Pt's CB: [phone]

## 2015-05-03 NOTE — Telephone Encounter (Signed)
Rx request Denied-Too Soon for refill; LMOM with contact name and number [for return cal, if needed]l RE: Rx sent for 90-day supply on 09.11.16 to [facility]

## 2015-05-03 NOTE — Telephone Encounter (Signed)
Has been corrected and sent to her [facility].

## 2015-05-20 ENCOUNTER — Encounter: Payer: Self-pay | Admitting: Physician Assistant

## 2015-05-20 ENCOUNTER — Ambulatory Visit (INDEPENDENT_AMBULATORY_CARE_PROVIDER_SITE_OTHER): Payer: BLUE CROSS/BLUE SHIELD | Admitting: Physician Assistant

## 2015-05-20 VITALS — BP 110/70 | HR 71 | Temp 97.7°F | Ht 63.0 in | Wt 162.0 lb

## 2015-05-20 DIAGNOSIS — E1165 Type 2 diabetes mellitus with hyperglycemia: Secondary | ICD-10-CM

## 2015-05-20 DIAGNOSIS — IMO0001 Reserved for inherently not codable concepts without codable children: Secondary | ICD-10-CM

## 2015-05-20 LAB — BASIC METABOLIC PANEL
BUN: 15 mg/dL (ref 6–23)
CO2: 28 meq/L (ref 19–32)
Calcium: 9.5 mg/dL (ref 8.4–10.5)
Chloride: 103 mEq/L (ref 96–112)
Creatinine, Ser: 0.72 mg/dL (ref 0.40–1.20)
GFR: 86.56 mL/min (ref >60.00)
GLUCOSE: 155 mg/dL — AB (ref 70–99)
POTASSIUM: 4 meq/L (ref 3.5–5.1)
SODIUM: 139 meq/L (ref 135–145)

## 2015-05-20 LAB — HEMOGLOBIN A1C: Hgb A1c MFr Bld: 8 % — ABNORMAL HIGH (ref 4.6–6.5)

## 2015-05-20 MED ORDER — ALPRAZOLAM 0.25 MG PO TABS: 0.2500 mg | ORAL_TABLET | Freq: Two times a day (BID) | ORAL | Status: DC | PRN

## 2015-05-20 MED ORDER — RAMIPRIL 2.5 MG PO CAPS: 2.5000 mg | ORAL_CAPSULE | Freq: Every day | ORAL | Status: DC

## 2015-05-20 NOTE — Patient Instructions (Signed)
Please continue medications as directed. Please stop by the lab for blood work. Will call you with your results.  If all is improving as suspected, we will follow-up every 6 months.

## 2015-05-20 NOTE — Progress Notes (Signed)
Pre visit review using our clinic review tool, if applicable. No additional management support is needed unless otherwise documented below in the visit note. 

## 2015-05-20 NOTE — Assessment & Plan Note (Signed)
Will repeat A1C today. Fasting sugars approaching goal. Will continue Ramipril. Patient declines flu shot. Will alter regimen based on results.

## 2015-05-20 NOTE — Progress Notes (Signed)
Patient presents to clinic today for follow-up of DM II. Is taking Metformin 2000 mg daily. Endorses fasting sugars averaging 110-130. Non fasting sugars averaging 180-200. Patient endorses eating a well-balanced diet overall. Is exercising some. Up to-date on Opthalmology exam. Patient declined flu shot.  Past Medical History  Diagnosis Date  . Diabetes mellitus without complication (HCC)     per pt. borderline diabetic  . Mood swings ([place])   . Anxiety   . Fibrocystic disease of breast     Current Outpatient Prescriptions on File Prior to Visit  Medication Sig Dispense Refill  . aspirin 81 MG EC tablet Take 81 mg by mouth daily.    . Blood Glucose Monitoring Suppl (TRUETRACK BLOOD GLUCOSE) W/DEVICE KIT CHECK BLOOD SUGAR FASTING AND TWO HOURS AFTER HER LARGEST MEAL.    . calcium carbonate (OS-CAL) 600 MG TABS tablet Take 2 tables by mouth in the morning.    Marland Kitchen FLUoxetine (PROZAC) 40 MG capsule Take 1 capsule (40 mg total) by mouth daily. 90 capsule 1  . metFORMIN (GLUCOPHAGE-XR) 500 MG 24 hr tablet Take 2 tablets (1,000 mg total) by mouth 2 (two) times daily. 360 tablet 1  . OVER THE COUNTER MEDICATION Multiple Vitamins-Minerals-Take 1 tablet by mouth daily.     No current facility-administered medications on file prior to visit.    No Known Allergies  Family History  Problem Relation Age of Onset  . Stroke Mother   . Heart attack Father   . Alzheimer's disease Sister     Social History   Social History  . Marital Status: Married    Spouse Name: N/A  . Number of Children: 1  . Years of Education: N/A   Occupational History  . Statistician    Social History Main Topics  . Smoking status: Never Smoker   . Smokeless tobacco: Never Used  . Alcohol Use: 0.0 oz/week    0 Standard drinks or equivalent per week     Comment: occas.  . Drug Use: No  . Sexual Activity:    Partners: Male   Other Topics Concern  . None   Social History Narrative   Review of Systems  - See HPI.  All other ROS are negative.  BP 110/70 mmHg  Pulse 71  Temp(Src) 97.7 F (36.5 C) (Oral)  Ht _0  (1.6 m)  Wt 162 lb (73.483 kg)  BMI 28.70 kg/m2  SpO2 97%  Physical Exam  Constitutional: She is oriented to person, place, and time and well-developed, well-nourished, and in no distress.  HENT:  Head: Normocephalic and atraumatic.  Cardiovascular: Normal rate, regular rhythm, normal heart sounds and intact distal pulses.   Pulmonary/Chest: Effort normal and breath sounds normal. No respiratory distress. She has no wheezes. She has no rales. She exhibits no tenderness.  Neurological: She is alert and oriented to person, place, and time.  Skin: Skin is warm and dry. No rash noted.  Psychiatric: Affect normal.  Vitals reviewed.   Recent Results (from the past 2160 hour(s))  Basic Metabolic Panel (BMET)     Status: Abnormal   Collection Time: 03/19/15  9:50 AM  Result Value Ref Range   Sodium 139 135 - 145 mEq/L   Potassium 3.9 3.5 - 5.1 mEq/L   Chloride 101 96 - 112 mEq/L   CO2 29 19 - 32 mEq/L   Glucose, Bld 207 (H) 70 - 99 mg/dL   BUN 15 6 - 23 mg/dL   Creatinine, Ser 0.71 0.40 -  1.20 mg/dL   Calcium 9.8 8.4 - 10.5 mg/dL   GFR 88.02 >60.00 mL/min    Assessment/Plan: Diabetes mellitus type II, uncontrolled Will repeat A1C today. Fasting sugars approaching goal. Will continue Ramipril. Patient declines flu shot. Will alter regimen based on results.

## 2015-05-23 ENCOUNTER — Telehealth: Payer: Self-pay | Admitting: Physician Assistant

## 2015-05-23 NOTE — Telephone Encounter (Signed)
Caller name: Self   Can Be Reached at: [phone]   Reason for call: Needs lab results

## 2015-05-24 MED ORDER — SITAGLIPTIN PHOSPHATE 50 MG PO TABS: 50.0000 mg | ORAL_TABLET | Freq: Every day | ORAL | Status: DC

## 2015-05-24 NOTE — Addendum Note (Signed)
Addended by: Raiford Noble on: 05/24/2015 01:05 PM   Modules accepted: Orders

## 2015-05-24 NOTE — Telephone Encounter (Signed)
Pt calling again for lab results

## 2015-05-24 NOTE — Telephone Encounter (Signed)
Spoke with patient concerning results. Januvia sent in. Voucher placed at front with patient's name. Follow-up 6 months.

## 2015-08-02 ENCOUNTER — Telehealth: Payer: Self-pay | Admitting: Physician Assistant

## 2015-08-02 NOTE — Telephone Encounter (Signed)
Pt stated that she does not take the flu shot

## 2015-08-02 NOTE — Telephone Encounter (Signed)
Documented in overdue health maintenance.

## 2015-08-23 ENCOUNTER — Other Ambulatory Visit: Payer: Self-pay | Admitting: Physician Assistant

## 2015-09-07 ENCOUNTER — Other Ambulatory Visit: Payer: Self-pay | Admitting: Physician Assistant

## 2015-11-13 ENCOUNTER — Telehealth: Payer: Self-pay | Admitting: Physician Assistant

## 2015-11-13 MED ORDER — METFORMIN HCL ER 500 MG PO TB24: 1000.0000 mg | ORAL_TABLET | Freq: Two times a day (BID) | ORAL | Status: DC

## 2015-11-13 NOTE — Telephone Encounter (Signed)
Rx sent to the pharmacy by e-script.//AB/CMA 

## 2015-11-13 NOTE — Telephone Encounter (Signed)
Caller name: Self  Can be reached: [phone]   Pharmacy:  [id number] - [place], [place] - [address] AT [address] [phone] (Phone) [phone] (Fax)        Reason for call: Has appointment 6/6 and needs enough metFORMIN (GLUCOPHAGE-XR) 500 MG 24 hr tablet [id number] to last until then

## 2015-11-15 ENCOUNTER — Other Ambulatory Visit: Payer: Self-pay | Admitting: Physician Assistant

## 2015-11-15 NOTE — Telephone Encounter (Signed)
Refill sent per [facility] refill protocol/SLS  

## 2015-12-09 ENCOUNTER — Encounter: Payer: BLUE CROSS/BLUE SHIELD | Admitting: Physician Assistant

## 2015-12-10 ENCOUNTER — Encounter: Payer: Self-pay | Admitting: Physician Assistant

## 2015-12-10 ENCOUNTER — Ambulatory Visit (INDEPENDENT_AMBULATORY_CARE_PROVIDER_SITE_OTHER): Payer: Medicare Other | Admitting: Physician Assistant

## 2015-12-10 VITALS — BP 108/72 | HR 68 | Temp 97.8°F | Resp 16 | Ht 63.0 in | Wt 157.2 lb

## 2015-12-10 DIAGNOSIS — G4485 Primary stabbing headache: Secondary | ICD-10-CM

## 2015-12-10 DIAGNOSIS — Z0000 Encounter for general adult medical examination without abnormal findings: Secondary | ICD-10-CM | POA: Diagnosis not present

## 2015-12-10 DIAGNOSIS — IMO0001 Reserved for inherently not codable concepts without codable children: Secondary | ICD-10-CM

## 2015-12-10 DIAGNOSIS — F329 Major depressive disorder, single episode, unspecified: Secondary | ICD-10-CM

## 2015-12-10 DIAGNOSIS — F418 Other specified anxiety disorders: Secondary | ICD-10-CM | POA: Diagnosis not present

## 2015-12-10 DIAGNOSIS — F32A Depression, unspecified: Secondary | ICD-10-CM

## 2015-12-10 DIAGNOSIS — Z136 Encounter for screening for cardiovascular disorders: Secondary | ICD-10-CM | POA: Diagnosis not present

## 2015-12-10 DIAGNOSIS — F419 Anxiety disorder, unspecified: Secondary | ICD-10-CM

## 2015-12-10 DIAGNOSIS — E1165 Type 2 diabetes mellitus with hyperglycemia: Secondary | ICD-10-CM

## 2015-12-10 LAB — COMPREHENSIVE METABOLIC PANEL
ALBUMIN: 4.1 g/dL (ref 3.5–5.2)
ALT: 25 U/L (ref 0–35)
AST: 17 U/L (ref 0–37)
Alkaline Phosphatase: 105 U/L (ref 39–117)
BUN: 21 mg/dL (ref 6–23)
CALCIUM: 9.8 mg/dL (ref 8.4–10.5)
CHLORIDE: 104 meq/L (ref 96–112)
CO2: 28 mEq/L (ref 19–32)
Creatinine, Ser: 0.74 mg/dL (ref 0.40–1.20)
GFR: 83.72 mL/min (ref >60.00)
Glucose, Bld: 175 mg/dL — ABNORMAL HIGH (ref 70–99)
POTASSIUM: 4.3 meq/L (ref 3.5–5.1)
Sodium: 140 mEq/L (ref 135–145)
Total Bilirubin: 0.3 mg/dL (ref 0.2–1.2)
Total Protein: 6.8 g/dL (ref 6.0–8.3)

## 2015-12-10 LAB — LIPID PANEL
CHOL/HDL RATIO: 3
CHOLESTEROL: 230 mg/dL — AB (ref 0–200)
HDL: 67.9 mg/dL (ref >39.00)
LDL CALC: 143 mg/dL — AB (ref 0–99)
NonHDL: 162.3
TRIGLYCERIDES: 98 mg/dL (ref 0.0–149.0)
VLDL: 19.6 mg/dL (ref 0.0–40.0)

## 2015-12-10 LAB — URINALYSIS, ROUTINE W REFLEX MICROSCOPIC
BILIRUBIN URINE: NEGATIVE
HGB URINE DIPSTICK: NEGATIVE
Ketones, ur: NEGATIVE
LEUKOCYTES UA: NEGATIVE
Nitrite: NEGATIVE
RBC / HPF: NONE SEEN (ref 0–?)
Specific Gravity, Urine: 1.025 (ref 1.000–1.030)
Total Protein, Urine: NEGATIVE
URINE GLUCOSE: NEGATIVE
Urobilinogen, UA: 0.2 (ref 0.0–1.0)
pH: 5.5 (ref 5.0–8.0)

## 2015-12-10 LAB — HEMOGLOBIN A1C: Hgb A1c MFr Bld: 7.5 % — ABNORMAL HIGH (ref 4.6–6.5)

## 2015-12-10 LAB — TSH: TSH: 1.39 u[IU]/mL (ref 0.35–4.50)

## 2015-12-10 NOTE — Patient Instructions (Signed)
Please go to the lab for blood work. I will call you with your results. Please continue chronic medications as directed.  We will alter diabetes regimen based on results. Keep up with diet and exercise.  You will be contacted to schedule MRI.  Follow-up in 3 months.

## 2015-12-10 NOTE — Progress Notes (Signed)
Pre visit review using our clinic review tool, if applicable. No additional management support is needed unless otherwise documented below in the visit note/SLS  

## 2015-12-10 NOTE — Progress Notes (Addendum)
Subjective:    Tonya Tapia is a 65 y.o. female who presents for a welcome to Medicare exam.   Cardiac risk factors: diabetes mellitus and dyslipidemia.  Activities of Daily Living  In your present state of health, do you have any difficulty performing the following activities?:  Preparing food and eating?: No Bathing yourself: No Getting dressed: No Using the toilet:No Moving around from place to place: No In the past year have you fallen or had a near fall?:No  Current exercise habits: Gym/ health club routine includes cardio and walking on track .   Dietary issues discussed: Body mass index is 27.86 kg/(m^2). Endorses well-balanced diet. Is trying to stay away from carbohydrates and sweets.    Depression Screen (Note: if answer to either of the following is "Yes", then a more complete depression screening is indicated)  Q1: Over the past two weeks, have you felt down, depressed or hopeless?yes Q2: Over the past two weeks, have you felt little interest or pleasure in doing things? no   See PHQ 9 Assessment. Feels symptoms mainly stem out of not having responsibilities. If she has things planned then mood is better. Is taking her Fluoxetine as directed. Is starting a new job which she is looking forward. Does not want to switch medication now. Wants to try job out first. Denies HI/HI.  Chronic Medical Issues: Diabetes Mellitus II -- Patient is currently on Metformin 1000 mg BID. Is taking as directed. Endorses checking fasting sugars. Endorses they are averaging around 150-180 but notes she is snacking at night. Foot exam and eye exam up-to-date. Is on ACEI currently.  Anxiety and Depression -- [place] currently on Prozac 40 mg daily. Endorses taking a directed. Endorses doing very well on this regimen. Denies SI/HI.  Acute Concerns: Patient endorses sharp pain in her head, usually behind the R eye of in the middle of her head. Is extremely severe but only lasting a couple of  seconds. Endorses this occurs maybe once a week. Denies vision changes or eye pain. Denies lightheadedness, dizziness, nausea or vomiting.    The following portions of the patient's history were reviewed and updated as appropriate: allergies, current medications, past family history, past medical history, past social history, past surgical history and problem list. Review of Systems Pertinent items noted in HPI and remainder of comprehensive ROS otherwise negative.    Objective:   Vision by Snellen chart: right eye:20/20, left eye:20/20 Blood pressure 108/72, pulse 68, temperature 97.8 F (36.6 C), temperature source Oral, resp. rate 16, height 5\' 3"  (1.6 m), weight 157 lb 4 oz (71.328 kg), SpO2 98 %. Body mass index is 27.86 kg/(m^2).   General appearance: alert, cooperative, appears stated age and no distress Head: Normocephalic, without obvious abnormality, atraumatic Eyes: conjunctivae/corneas clear. PERRL, EOM's intact. Fundi benign. Ears: normal TM's and external ear canals both ears Nose: Nares normal. Septum midline. Mucosa normal. No drainage or sinus tenderness. Throat: lips, mucosa, and tongue normal; teeth and gums normal Lungs: clear to auscultation bilaterally Heart: regular rate and rhythm, S1, S2 normal, no murmur, click, rub or gallop Abdomen: soft, non-tender; bowel sounds normal; no masses,  no organomegaly Pulses: 2+ and symmetric Skin: Skin color, texture, turgor normal. No rashes or lesions Lymph nodes: Cervical, supraclavicular, and axillary nodes normal. Neurologic: Alert and oriented X 3, normal strength and tone. Normal symmetric reflexes. Normal coordination and gait    Assessment:    (1) Welcome to Medicare Examination  (2) DM II, uncontrolled without complications  (  3) Anxiety and Depression  (4) Stabbing Headache  (5) Screening for Ischemic Heart Disease     Plan:     (1) During the course of the visit the patient was educated and counseled about  appropriate screening and preventive services including:   Screening electrocardiogram  Screening mammography  Screening Pap smear and pelvic exam   Bone densitometry screening  Colorectal cancer screening  Nutrition counseling   Advanced directives: has an advanced directive - a copy HAS NOT been provided.   (2) Will repeat BMP and A1C today. Discussed limiting snacking to help with glucose levels. Exercise reviewed.  Will alter regimen based on results.  (3) Doing very well. Will continue current regimen. FU 6 months.  (4) Rare symptoms. Neuro examination unremarkable today. Will obtain MRI Brain to further assess. Alarm signs/symptoms which would prompt ER visit. Will refer to Neuro pending imaging results.  (5) EKG reveals NSR. Will check lipid panel today. Will work on glycemic control to further reduce risks. Continue ASA.   Patient Instructions (the written plan) was given to the patient.    Leeanne Rio, PA-C

## 2015-12-14 ENCOUNTER — Ambulatory Visit (HOSPITAL_BASED_OUTPATIENT_CLINIC_OR_DEPARTMENT_OTHER)
Admission: RE | Admit: 2015-12-14 | Discharge: 2015-12-14 | Disposition: A | Discharge status: Home / Self Care | Payer: Medicare Other | Source: Ambulatory Visit | Attending: Physician Assistant | Admitting: Physician Assistant

## 2015-12-14 DIAGNOSIS — M1288 Other specific arthropathies, not elsewhere classified, other specified site: Secondary | ICD-10-CM | POA: Diagnosis not present

## 2015-12-14 DIAGNOSIS — G4485 Primary stabbing headache: Secondary | ICD-10-CM | POA: Insufficient documentation

## 2015-12-14 DIAGNOSIS — M2548 Effusion, other site: Secondary | ICD-10-CM | POA: Insufficient documentation

## 2015-12-14 DIAGNOSIS — R51 Headache: Secondary | ICD-10-CM | POA: Diagnosis not present

## 2015-12-17 ENCOUNTER — Encounter: Payer: Self-pay | Admitting: *Deleted

## 2015-12-17 ENCOUNTER — Other Ambulatory Visit: Payer: Self-pay | Admitting: Physician Assistant

## 2015-12-17 ENCOUNTER — Telehealth: Payer: Self-pay | Admitting: *Deleted

## 2015-12-17 MED ORDER — ATORVASTATIN CALCIUM 10 MG PO TABS: 10.0000 mg | ORAL_TABLET | Freq: Every day | ORAL | Status: DC

## 2015-12-17 NOTE — Telephone Encounter (Signed)
Called and spoke with the pt and informed her of recent lab results and note.   Pt verbalized understanding and agreed.   New prescription sent to the pharmacy by e-script.  Pt is already scheduled a 3 month follow-up for (Wed-03/11/16 @ 9:15am).  Informed the pt that I will mail her copy of recent results along with Cody's note.  Results and note mailed.  Pt agreed.//AB/CMA

## 2015-12-17 NOTE — Telephone Encounter (Signed)
-----   Message from Brunetta Jeans, PA-C sent at 12/16/2015 12:06 PM EDT ----- Labs are in. (1) Urine looks great overall. Calcium oxalate crystals noted. This means she is at increased risk for kidney stone. Increase fluids. Limit foods high in calcium oxalate (can google). If she develops any flank pain or urinary symptoms she should be seen in office. (2) Cholesterol is borderline high at 230 but LDL elevated in 140s. Giving Diabetes, her goal LDL is < 100. Need to start medication. Want to start Lipitor 10 mg daily. Ok to send in 30 day supply with 2 refills. FU 3 months. Increase exercise. Limit foods high in cholesterol and saturated fats. (3) Thyroid function looks good. (4) A1C has decreased from 8 to 7.5. Will continue current regimen for now. Continue diet and exercise regimen. Will recheck in 6 months.

## 2016-02-11 ENCOUNTER — Other Ambulatory Visit: Payer: Self-pay | Admitting: Physician Assistant

## 2016-02-11 NOTE — Telephone Encounter (Signed)
Rx request to pharmacy/SLS  

## 2016-03-11 ENCOUNTER — Ambulatory Visit: Payer: Medicare Other | Admitting: Physician Assistant

## 2016-03-16 ENCOUNTER — Encounter: Payer: Self-pay | Admitting: Physician Assistant

## 2016-03-16 ENCOUNTER — Ambulatory Visit (INDEPENDENT_AMBULATORY_CARE_PROVIDER_SITE_OTHER): Payer: Medicare Other | Admitting: Physician Assistant

## 2016-03-16 VITALS — BP 96/62 | HR 80 | Temp 97.7°F | Resp 16 | Ht 63.0 in | Wt 159.1 lb

## 2016-03-16 DIAGNOSIS — E785 Hyperlipidemia, unspecified: Secondary | ICD-10-CM

## 2016-03-16 DIAGNOSIS — H6993 Unspecified Eustachian tube disorder, bilateral: Secondary | ICD-10-CM

## 2016-03-16 DIAGNOSIS — H6983 Other specified disorders of Eustachian tube, bilateral: Secondary | ICD-10-CM | POA: Diagnosis not present

## 2016-03-16 DIAGNOSIS — Z23 Encounter for immunization: Secondary | ICD-10-CM

## 2016-03-16 DIAGNOSIS — IMO0001 Reserved for inherently not codable concepts without codable children: Secondary | ICD-10-CM

## 2016-03-16 DIAGNOSIS — E1169 Type 2 diabetes mellitus with other specified complication: Secondary | ICD-10-CM

## 2016-03-16 DIAGNOSIS — E1165 Type 2 diabetes mellitus with hyperglycemia: Secondary | ICD-10-CM | POA: Diagnosis not present

## 2016-03-16 MED ORDER — FLUTICASONE PROPIONATE 50 MCG/ACT NA SUSP: 2.0000 | Freq: Every day | NASAL | 6 refills | Status: DC

## 2016-03-16 NOTE — Progress Notes (Signed)
Pre visit review using our clinic review tool, if applicable. No additional management support is needed unless otherwise documented below in the visit note/SLS  

## 2016-03-16 NOTE — Progress Notes (Signed)
History of Present Illness: Patient is a 65 y.o. female who presents to clinic today for follow-up of Diabetes Mellitus II, previously controlled.  Patient currently on medication regimen of Metformin 1000 mg BID and Ramipril 2.5 mg daily.  Is taking medications as directed. Endorses trying to watch diet. Has cut out all sweets.  Denies numbness, tingling of extremities. Denies polyuria, polydipsia, polyphagia, urinary urgency or frequency. Denies vision changes. . Is not checking blood glucose as directed. If not participating in regular exercise at the moment.   Latest Maintenance: A1C --  Lab Results  Component Value Date   HGBA1C 7.5 (H) 12/10/2015   Diabetic Eye Exam -- up-to-date Urine Microalbumin -- on ACEI Foot Exam -- due. Denies concerns. Patient is due for Prevnar. Agrees to have today.  Patient also following up for hyperlipidemia. At last check LDL in 140s. Goal giving DM is < 100. Patient was started on Lipitor 10 mg daily. Is taking as directed. As above, is watching diet but not participating in regular exercise.  Patient also notes ear pressure and popping bilaterally over the past week. Notes some mild vertigo in short episodes. Denies sinus pressure, sinus pain, cough, PND. Denies fever, chills, malaise or fatigue. Denies vision changes.   Past Medical History:  Diagnosis Date  . Anxiety   . Diabetes mellitus without complication (HCC)    per pt. borderline diabetic  . Fibrocystic disease of breast   . Mood swings ([place])     Current Outpatient Prescriptions on File Prior to Visit  Medication Sig Dispense Refill  . ALPRAZolam (XANAX) 0.25 MG tablet TAKE 1/2- 1 TABLET EVERY 12 HOURS AS NEEDED FOR ANXIETY 30 tablet 2  . aspirin 81 MG EC tablet Take 81 mg by mouth daily.    Marland Kitchen atorvastatin (LIPITOR) 10 MG tablet Take 1 tablet (10 mg total) by mouth at bedtime. 30 tablet 2  . Blood Glucose Monitoring Suppl (TRUETRACK BLOOD GLUCOSE) W/DEVICE KIT CHECK BLOOD SUGAR  FASTING AND TWO HOURS AFTER HER LARGEST MEAL.    Marland Kitchen FLUoxetine (PROZAC) 40 MG capsule TAKE 1 CAPSULE(40 MG) BY MOUTH DAILY 90 capsule 0  . metFORMIN (GLUCOPHAGE-XR) 500 MG 24 hr tablet Take 2 tablets (1,000 mg total) by mouth 2 (two) times daily with a meal. 360 tablet 0  . omeprazole (PRILOSEC OTC) 20 MG tablet Take 20 mg by mouth daily.    Marland Kitchen OVER THE COUNTER MEDICATION Multiple Vitamins-Minerals-Take 1 tablet by mouth daily.    . ramipril (ALTACE) 2.5 MG capsule TAKE 1 CAPSULE(2.5 MG) BY MOUTH DAILY 90 capsule 1   No current facility-administered medications on file prior to visit.     No Known Allergies  Family History  Problem Relation Age of Onset  . Stroke Mother   . Heart attack Father   . Alzheimer's disease Sister     #1-Deceased  . Breast cancer Sister     #1  . Healthy Sister     #2    Social History   Social History  . Marital status: Married    Spouse name: N/A  . Number of children: 1  . Years of education: N/A   Occupational History  . Statistician    Social History Main Topics  . Smoking status: Never Smoker  . Smokeless tobacco: Never Used  . Alcohol use 0.0 oz/week     Comment: occas.  . Drug use: No  . Sexual activity: Yes    Partners: Male   Other Topics Concern  .  None   Social History Narrative  . None   Review of Systems: Pertinent ROS are listed in HPI  Physical Examination: BP 96/62 (BP Location: Left Arm, Patient Position: Sitting, Cuff Size: Normal)   Pulse 80   Temp 97.7 F (36.5 C) (Oral)   Resp 16   Ht _0  (1.6 m)   Wt 159 lb 2 oz (72.2 kg)   SpO2 99%   BMI 28.19 kg/m  General appearance: alert, cooperative, appears stated age and no distress Eyes: conjunctivae/corneas clear. PERRL, EOM's intact. Fundi benign. Lungs: clear to auscultation bilaterally Heart: regular rate and rhythm, S1, S2 normal, no murmur, click, rub or gallop Extremities: extremities normal, atraumatic, no cyanosis or edema Pulses: 2+ and  symmetric Skin: Skin color, texture, turgor normal. No rashes or lesions  Diabetic Foot Exam - Simple   Simple Foot Form Diabetic Foot exam was performed with the following findings:  Yes 03/16/2016  4:30 PM  Visual Inspection No deformities, no ulcerations, no other skin breakdown bilaterally:  Yes Sensation Testing Intact to touch and monofilament testing bilaterally:  Yes Pulse Check Posterior Tibialis and Dorsalis pulse intact bilaterally:  Yes Comments    Assessment/Plan: 1. Eustachian tube dysfunction, bilateral Causing mild dizziness. Rx Flonase. Claritin daily. Supportive measures reviewed. FU if not resolving. - fluticasone (FLONASE) 50 MCG/ACT nasal spray; Place 2 sprays into both nostrils daily.  Dispense: 16 g; Refill: 6  2. Hyperlipidemia associated with type 2 diabetes mellitus ([place]) Is taking statin as directed. Discussed again exercise recommendations. Goal is for 150 minutes per week. Diet reviewed. Will check repeat labs today. - Lipid Profile - Comp Met (CMET)  3. Need for vaccination with 13-polyvalent pneumococcal conjugate vaccine Prevnar given by nursing staff. - Pneumococcal conjugate vaccine 13-valent  4. Uncontrolled type 2 diabetes mellitus without complication, without long-term current use of insulin ([place]) Foot exam updated today. Within normal limits. Reviewed diet and exercise regimen. Is compliant with medications including ACEI. BP stable. Will check repeat labs at next visit in 3 months.

## 2016-03-16 NOTE — Patient Instructions (Signed)
Please go to the lab. I will call with your results. Please make sure to eat a well-balanced diet. Watch those carbohydrates. Try to work on a goal of up to 150 minutes of exercise.  Check fasting sugars once daily and record.   Follow-up in 3 months for diabetes. At that time we will cut down to twice a year.

## 2016-03-17 ENCOUNTER — Other Ambulatory Visit: Payer: Self-pay | Admitting: Physician Assistant

## 2016-03-17 LAB — COMPREHENSIVE METABOLIC PANEL
ALT: 16 U/L (ref 0–35)
AST: 15 U/L (ref 0–37)
Albumin: 4.1 g/dL (ref 3.5–5.2)
Alkaline Phosphatase: 81 U/L (ref 39–117)
BILIRUBIN TOTAL: 0.3 mg/dL (ref 0.2–1.2)
BUN: 18 mg/dL (ref 6–23)
CALCIUM: 9.4 mg/dL (ref 8.4–10.5)
CO2: 30 meq/L (ref 19–32)
CREATININE: 0.72 mg/dL (ref 0.40–1.20)
Chloride: 105 mEq/L (ref 96–112)
GFR: 86.34 mL/min (ref >60.00)
Glucose, Bld: 113 mg/dL — ABNORMAL HIGH (ref 70–99)
Potassium: 4.1 mEq/L (ref 3.5–5.1)
Sodium: 140 mEq/L (ref 135–145)
Total Protein: 7 g/dL (ref 6.0–8.3)

## 2016-03-17 LAB — LDL CHOLESTEROL, DIRECT: LDL DIRECT: 87 mg/dL

## 2016-03-17 LAB — LIPID PANEL
CHOL/HDL RATIO: 3
CHOLESTEROL: 183 mg/dL (ref 0–200)
HDL: 62 mg/dL (ref >39.00)
NonHDL: 121.18
Triglycerides: 318 mg/dL — ABNORMAL HIGH (ref 0.0–149.0)
VLDL: 63.6 mg/dL — ABNORMAL HIGH (ref 0.0–40.0)

## 2016-03-18 NOTE — Telephone Encounter (Signed)
Rx request to pharmacy/SLS  

## 2016-04-30 ENCOUNTER — Other Ambulatory Visit: Payer: Self-pay | Admitting: *Deleted

## 2016-04-30 MED ORDER — METFORMIN HCL ER 500 MG PO TB24: 1000.0000 mg | ORAL_TABLET | Freq: Two times a day (BID) | ORAL | 0 refills | Status: DC

## 2016-04-30 NOTE — Telephone Encounter (Signed)
Pt currently out of town in [place], [place] and forgot her metformin. Has all other medications with her. Requesting supply be sent to local pharmacy. Medication filled to pharmacy as requested.   Pharmacy: [id number] - [place], [place]

## 2016-06-15 ENCOUNTER — Encounter: Payer: Self-pay | Admitting: Physician Assistant

## 2016-06-15 ENCOUNTER — Other Ambulatory Visit: Payer: Self-pay | Admitting: Physician Assistant

## 2016-06-15 ENCOUNTER — Ambulatory Visit (INDEPENDENT_AMBULATORY_CARE_PROVIDER_SITE_OTHER): Payer: Medicare Other | Admitting: Physician Assistant

## 2016-06-15 ENCOUNTER — Ambulatory Visit: Payer: Medicare Other | Admitting: Physician Assistant

## 2016-06-15 VITALS — BP 118/76 | HR 82 | Temp 97.6°F | Resp 14 | Ht 63.0 in | Wt 160.0 lb

## 2016-06-15 DIAGNOSIS — Z780 Asymptomatic menopausal state: Secondary | ICD-10-CM | POA: Diagnosis not present

## 2016-06-15 DIAGNOSIS — E890 Postprocedural hypothyroidism: Secondary | ICD-10-CM

## 2016-06-15 DIAGNOSIS — IMO0001 Reserved for inherently not codable concepts without codable children: Secondary | ICD-10-CM

## 2016-06-15 DIAGNOSIS — E1165 Type 2 diabetes mellitus with hyperglycemia: Secondary | ICD-10-CM

## 2016-06-15 DIAGNOSIS — Z9009 Acquired absence of other part of head and neck: Secondary | ICD-10-CM

## 2016-06-15 LAB — COMPREHENSIVE METABOLIC PANEL
ALT: 15 U/L (ref 0–35)
AST: 13 U/L (ref 0–37)
Albumin: 4.5 g/dL (ref 3.5–5.2)
Alkaline Phosphatase: 83 U/L (ref 39–117)
BILIRUBIN TOTAL: 0.5 mg/dL (ref 0.2–1.2)
BUN: 20 mg/dL (ref 6–23)
CO2: 28 meq/L (ref 19–32)
Calcium: 10.2 mg/dL (ref 8.4–10.5)
Chloride: 101 mEq/L (ref 96–112)
Creatinine, Ser: 0.82 mg/dL (ref 0.40–1.20)
GFR: 74.25 mL/min (ref >60.00)
GLUCOSE: 232 mg/dL — AB (ref 70–99)
POTASSIUM: 4.4 meq/L (ref 3.5–5.1)
Sodium: 138 mEq/L (ref 135–145)
Total Protein: 7.1 g/dL (ref 6.0–8.3)

## 2016-06-15 LAB — LIPID PANEL
CHOL/HDL RATIO: 2
Cholesterol: 196 mg/dL (ref 0–200)
HDL: 81.9 mg/dL (ref >39.00)
LDL Cholesterol: 98 mg/dL (ref 0–99)
NONHDL: 113.88
Triglycerides: 78 mg/dL (ref 0.0–149.0)
VLDL: 15.6 mg/dL (ref 0.0–40.0)

## 2016-06-15 LAB — HEMOGLOBIN A1C: Hgb A1c MFr Bld: 8.4 % — ABNORMAL HIGH (ref 4.6–6.5)

## 2016-06-15 NOTE — Assessment & Plan Note (Signed)
Order for DEXA placed.

## 2016-06-15 NOTE — Assessment & Plan Note (Signed)
Will repeat labs today to include CMP, A1C, Lipid Panel. Diet and exercise are big issues. Declines nutrition referral AMA. Dietary handout given. Discussed carb counting and meal planning. 150+ minutes of aerobic exercise per week. Continue current medications at present. Will alter based on lab results. Plan on FU in 3 months if A1C > 8, 6 months if < 8.

## 2016-06-15 NOTE — Progress Notes (Signed)
History of Present Illness: Patient is a 65 y.o. female who presents to clinic today for follow-up of Diabetes Mellitus II, previously controlled with A1C at 7.5. Patient without history of retinopathy, neuropathy or nephropathy.  Patient currently on medication regimen of Metformin XR 1000 mg BID.  Is taking medications as directed. Endorses she has not been eating well. Notes she and her husband do not cook and have been eating out most meals. High intake of carbs including breads, pizza and pastas.  Denies good intake of vegetables. Is not exercising as directed at previous visit. Is checking blood glucose as directed. Notes fasting sugars averaging 170-240s and non-fasting sugars averaging 115-200s. Patient does endorse eating cookies at bedtime each night.   Latest Maintenance: A1C --  Lab Results  Component Value Date   HGBA1C 7.5 (H) 12/10/2015   Diabetic Eye Exam -- Overdue. Followed by [facility].  Urine Microalbumin -- On ACEI Foot Exam -- up-to-date.   Declines flu shot.   Past Medical History:  Diagnosis Date  . Anxiety   . Diabetes mellitus without complication (HCC)    per pt. borderline diabetic  . Fibrocystic disease of breast   . Mood swings ([place])     Current Outpatient Prescriptions on File Prior to Visit  Medication Sig Dispense Refill  . ALPRAZolam (XANAX) 0.25 MG tablet TAKE 1/2- 1 TABLET EVERY 12 HOURS AS NEEDED FOR ANXIETY 30 tablet 2  . aspirin 81 MG EC tablet Take 81 mg by mouth daily.    Marland Kitchen atorvastatin (LIPITOR) 10 MG tablet TAKE 1 TABLET(10 MG) BY MOUTH AT BEDTIME 30 tablet 2  . Blood Glucose Monitoring Suppl (TRUETRACK BLOOD GLUCOSE) W/DEVICE KIT CHECK BLOOD SUGAR FASTING AND TWO HOURS AFTER HER LARGEST MEAL.    . Calcium Carb-Cholecalciferol (CALCIUM-VITAMIN D) 600-400 MG-UNIT TABS Take 2 each by mouth daily.    Marland Kitchen FLUoxetine (PROZAC) 40 MG capsule TAKE 1 CAPSULE(40 MG) BY MOUTH DAILY 90 capsule 0  . fluticasone (FLONASE) 50 MCG/ACT nasal spray Place  2 sprays into both nostrils daily. 16 g 6  . metFORMIN (GLUCOPHAGE-XR) 500 MG 24 hr tablet Take 2 tablets (1,000 mg total) by mouth 2 (two) times daily with a meal. 120 tablet 0  . OVER THE COUNTER MEDICATION Multiple Vitamins-Minerals-Take 1 tablet by mouth daily.    . ramipril (ALTACE) 2.5 MG capsule TAKE 1 CAPSULE(2.5 MG) BY MOUTH DAILY 90 capsule 1   No current facility-administered medications on file prior to visit.     No Known Allergies  Family History  Problem Relation Age of Onset  . Stroke Mother   . Heart attack Father   . Alzheimer's disease Sister     #1-Deceased  . Breast cancer Sister     #1  . Healthy Sister     #2    Social History   Social History  . Marital status: Married    Spouse name: N/A  . Number of children: 1  . Years of education: N/A   Occupational History  . Statistician    Social History Main Topics  . Smoking status: Never Smoker  . Smokeless tobacco: Never Used  . Alcohol use 0.0 oz/week     Comment: occas.  . Drug use: No  . Sexual activity: Yes    Partners: Male   Other Topics Concern  . None   Social History Narrative  . None   Review of Systems: Pertinent ROS are listed in HPI  Physical Examination: BP 118/76  Pulse 82   Temp 97.6 F (36.4 C) (Oral)   Resp 14   Ht 5' 3" (1.6 m)   Wt 160 lb (72.6 kg)   SpO2 97%   BMI 28.34 kg/m  General appearance: alert, cooperative, appears stated age and no distress Head: Normocephalic, without obvious abnormality, atraumatic Lungs: clear to auscultation bilaterally Heart: regular rate and rhythm, S1, S2 normal, no murmur, click, rub or gallop   Assessment/Plan: Postmenopausal estrogen deficiency Order for DEXA placed.  Diabetes mellitus type II, uncontrolled Will repeat labs today to include CMP, A1C, Lipid Panel. Diet and exercise are big issues. Declines nutrition referral AMA. Dietary handout given. Discussed carb counting and meal planning. 150+ minutes of aerobic  exercise per week. Continue current medications at present. Will alter based on lab results. Plan on FU in 3 months if A1C > 8, 6 months if < 8.

## 2016-06-15 NOTE — Patient Instructions (Signed)
Please go to the lab for blood work. I will call you with your results.  Please continue medications as directed for now.  We may alter this regimen if indicated by results.  Please call [facility] at [phone] to schedule you eye exam as you are overdue.  You need to increase aerobic exercise to a goal of at least 150 minutes per week. Try to eat more at home or make healthier choices when out to eat. Lean proteins and vegetables!. Limit those carbs and cut back on the late-night snacking.  Please reconsider a referral to Nutrition.   Continue other medications as directed.  Follow-up will be based on labs.    Diabetes Mellitus and Exercise Exercising regularly is important for your overall health, especially when you have diabetes (diabetes mellitus). Exercising is not only about losing weight. It has many health benefits, such as increasing muscle strength and bone density and reducing body fat and stress. This leads to improved fitness, flexibility, and endurance, all of which result in better overall health. Exercise has additional benefits for people with diabetes, including:  Reducing appetite.  Helping to lower and control blood glucose.  Lowering blood pressure.  Helping to control amounts of fatty substances (lipids) in the blood, such as cholesterol and triglycerides.  Helping the body to respond better to insulin (improving insulin sensitivity).  Reducing how much insulin the body needs.  Decreasing the risk for heart disease by:  Lowering cholesterol and triglyceride levels.  Increasing the levels of good cholesterol.  Lowering blood glucose levels. What is my activity plan? Your health care provider or certified diabetes educator can help you make a plan for the type and frequency of exercise (activity plan) that works for you. Make sure that you:  Do at least 150 minutes of moderate-intensity or vigorous-intensity exercise each week. This could be brisk  walking, biking, or water aerobics.  Do stretching and strength exercises, such as yoga or weightlifting, at least 2 times a week.  Spread out your activity over at least 3 days of the week.  Get some form of physical activity every day.  Do not go more than 2 days in a row without some kind of physical activity.  Avoid being inactive for more than 90 minutes at a time. Take frequent breaks to walk or stretch.  Choose a type of exercise or activity that you enjoy, and set realistic goals.  Start slowly, and gradually increase the intensity of your exercise over time. What do I need to know about managing my diabetes?  Check your blood glucose before and after exercising.  If your blood glucose is higher than 240 mg/dL (13.3 mmol/L) before you exercise, check your urine for ketones. If you have ketones in your urine, do not exercise until your blood glucose returns to normal.  Know the symptoms of low blood glucose (hypoglycemia) and how to treat it. Your risk for hypoglycemia increases during and after exercise. Common symptoms of hypoglycemia can include:  Hunger.  Anxiety.  Sweating and feeling clammy.  Confusion.  Dizziness or feeling light-headed.  Increased heart rate or palpitations.  Blurry vision.  Tingling or numbness around the mouth, lips, or tongue.  Tremors or shakes.  Irritability.  Keep a rapid-acting carbohydrate snack available before, during, and after exercise to help prevent or treat hypoglycemia.  Avoid injecting insulin into areas of the body that are going to be exercised. For example, avoid injecting insulin into:  The arms, when playing tennis.  The legs, when jogging.  Keep records of your exercise habits. Doing this can help you and your health care provider adjust your diabetes management plan as needed. Write down:  Food that you eat before and after you exercise.  Blood glucose levels before and after you exercise.  The type and  amount of exercise you have done.  When your insulin is expected to peak, if you use insulin. Avoid exercising at times when your insulin is peaking.  When you start a new exercise or activity, work with your health care provider to make sure the activity is safe for you, and to adjust your insulin, medicines, or food intake as needed.  Drink plenty of water while you exercise to prevent dehydration or heat stroke. Drink enough fluid to keep your urine clear or pale yellow. This information is not intended to replace advice given to you by your health care provider. Make sure you discuss any questions you have with your health care provider. Document Released: 09/12/2003 Document Revised: 01/10/2016 Document Reviewed: 12/02/2015 Elsevier Interactive Patient Education  2017 [employer].

## 2016-06-15 NOTE — Progress Notes (Signed)
Pre visit review using our clinic review tool, if applicable. No additional management support is needed unless otherwise documented below in the visit note. 

## 2016-07-08 ENCOUNTER — Ambulatory Visit (HOSPITAL_BASED_OUTPATIENT_CLINIC_OR_DEPARTMENT_OTHER)
Admission: RE | Admit: 2016-07-08 | Discharge: 2016-07-08 | Disposition: A | Discharge status: Home / Self Care | Payer: Medicare Other | Source: Ambulatory Visit | Attending: Physician Assistant | Admitting: Physician Assistant

## 2016-07-08 DIAGNOSIS — Z13820 Encounter for screening for osteoporosis: Secondary | ICD-10-CM | POA: Diagnosis not present

## 2016-07-08 DIAGNOSIS — M85852 Other specified disorders of bone density and structure, left thigh: Secondary | ICD-10-CM | POA: Diagnosis not present

## 2016-07-08 DIAGNOSIS — M8580 Other specified disorders of bone density and structure, unspecified site: Secondary | ICD-10-CM | POA: Insufficient documentation

## 2016-07-08 DIAGNOSIS — Z780 Asymptomatic menopausal state: Secondary | ICD-10-CM | POA: Diagnosis not present

## 2016-07-08 LAB — HM DIABETES EYE EXAM

## 2016-07-13 ENCOUNTER — Other Ambulatory Visit: Payer: Self-pay | Admitting: Physician Assistant

## 2016-07-13 ENCOUNTER — Encounter: Payer: Self-pay | Admitting: Emergency Medicine

## 2016-07-13 ENCOUNTER — Ambulatory Visit (INDEPENDENT_AMBULATORY_CARE_PROVIDER_SITE_OTHER): Payer: Medicare Other | Admitting: Physician Assistant

## 2016-07-13 ENCOUNTER — Encounter: Payer: Self-pay | Admitting: Physician Assistant

## 2016-07-13 VITALS — BP 108/68 | HR 70 | Temp 97.9°F | Resp 14 | Ht 63.0 in | Wt 156.0 lb

## 2016-07-13 DIAGNOSIS — E559 Vitamin D deficiency, unspecified: Secondary | ICD-10-CM

## 2016-07-13 DIAGNOSIS — E119 Type 2 diabetes mellitus without complications: Secondary | ICD-10-CM

## 2016-07-13 DIAGNOSIS — M8580 Other specified disorders of bone density and structure, unspecified site: Secondary | ICD-10-CM

## 2016-07-13 LAB — COMPREHENSIVE METABOLIC PANEL
ALBUMIN: 4.3 g/dL (ref 3.5–5.2)
ALT: 16 U/L (ref 0–35)
AST: 17 U/L (ref 0–37)
Alkaline Phosphatase: 77 U/L (ref 39–117)
BUN: 15 mg/dL (ref 6–23)
CO2: 27 mEq/L (ref 19–32)
Calcium: 10 mg/dL (ref 8.4–10.5)
Chloride: 103 mEq/L (ref 96–112)
Creatinine, Ser: 0.78 mg/dL (ref 0.40–1.20)
GFR: 78.64 mL/min (ref >60.00)
GLUCOSE: 148 mg/dL — AB (ref 70–99)
POTASSIUM: 4 meq/L (ref 3.5–5.1)
Sodium: 139 mEq/L (ref 135–145)
TOTAL PROTEIN: 7 g/dL (ref 6.0–8.3)
Total Bilirubin: 0.4 mg/dL (ref 0.2–1.2)

## 2016-07-13 MED ORDER — GLIPIZIDE ER 2.5 MG PO TB24: 2.5000 mg | ORAL_TABLET | Freq: Every day | ORAL | 1 refills | Status: DC

## 2016-07-13 NOTE — Progress Notes (Signed)
Pre visit review using our clinic review tool, if applicable. No additional management support is needed unless otherwise documented below in the visit note. 

## 2016-07-13 NOTE — Patient Instructions (Signed)
Please go to the lab for blood work. I will call you with your results.  Please continue the Metformin. Start the Glucotrol XL. Do not skip meals while on this medication. Continue checking fasting sugars daily. Follow-up 3-4 weeks.   Return sooner if needed.

## 2016-07-13 NOTE — Progress Notes (Signed)
Patient presents to clinic today for follow-up of DM without complication. Patient previously with well-controlled DM of A1C at 7.5. [place] off her diet regimen with increased fasting sugars. Last A1C had risen to 8.4. Patient has continued her Metformin as directed. Since last visit patient has worked hard on her diet. Has limited carbohydrates. Is walking about 2 x week for 20 minutes. Is checking fasting sugars. Endorses sugars averaging 160-240 at present. Is up-to-date on foot examination, eye examination (no retinopathy) and pneumonia vaccines. Is currently on ramipril 2.5 mg daily. Patient denies chest pain, palpitations, lightheadedness, dizziness, vision changes or frequent headaches.  BP Readings from Last 3 Encounters:  07/13/16 108/68  06/15/16 118/76  03/16/16 96/62   Declined flu shot.   Patient also with recent DEXA scan. Will review results and next steps with patient today.   Past Medical History:  Diagnosis Date  . Anxiety   . Diabetes mellitus without complication (HCC)    per pt. borderline diabetic  . Fibrocystic disease of breast   . Mood swings ([place])     Current Outpatient Prescriptions on File Prior to Visit  Medication Sig Dispense Refill  . ALPRAZolam (XANAX) 0.25 MG tablet TAKE 1/2- 1 TABLET EVERY 12 HOURS AS NEEDED FOR ANXIETY 30 tablet 2  . aspirin 81 MG EC tablet Take 81 mg by mouth daily.    Marland Kitchen atorvastatin (LIPITOR) 10 MG tablet TAKE 1 TABLET(10 MG) BY MOUTH AT BEDTIME 30 tablet 0  . Blood Glucose Monitoring Suppl (TRUETRACK BLOOD GLUCOSE) W/DEVICE KIT CHECK BLOOD SUGAR FASTING AND TWO HOURS AFTER HER LARGEST MEAL.    . Calcium Carb-Cholecalciferol (CALCIUM-VITAMIN D) 600-400 MG-UNIT TABS Take 2 each by mouth daily.    Marland Kitchen FLUoxetine (PROZAC) 40 MG capsule TAKE 1 CAPSULE(40 MG) BY MOUTH DAILY 90 capsule 0  . fluticasone (FLONASE) 50 MCG/ACT nasal spray Place 2 sprays into both nostrils daily. 16 g 6  . OVER THE COUNTER MEDICATION Multiple  Vitamins-Minerals-Take 1 tablet by mouth daily.    . ramipril (ALTACE) 2.5 MG capsule TAKE 1 CAPSULE(2.5 MG) BY MOUTH DAILY 90 capsule 0   No current facility-administered medications on file prior to visit.     No Known Allergies  Family History  Problem Relation Age of Onset  . Stroke Mother   . Heart attack Father   . Alzheimer's disease Sister     #1-Deceased  . Breast cancer Sister     #1  . Healthy Sister     #2    Social History   Social History  . Marital status: Married    Spouse name: N/A  . Number of children: 1  . Years of education: N/A   Occupational History  . Statistician    Social History Main Topics  . Smoking status: Never Smoker  . Smokeless tobacco: Never Used  . Alcohol use 0.0 oz/week     Comment: occas.  . Drug use: No  . Sexual activity: Yes    Partners: Male   Other Topics Concern  . None   Social History Narrative  . None   Review of Systems - See HPI.  All other ROS are negative.  BP 108/68   Pulse 70   Temp 97.9 F (36.6 C) (Oral)   Resp 14   Ht _0  (1.6 m)   Wt 156 lb (70.8 kg)   SpO2 96%   BMI 27.63 kg/m   Physical Exam  Constitutional: She is oriented to person, place, and  time and well-developed, well-nourished, and in no distress.  HENT:  Head: Normocephalic and atraumatic.  Eyes: Conjunctivae are normal.  Neck: Neck supple.  Cardiovascular: Normal rate, regular rhythm, normal heart sounds and intact distal pulses.   Pulmonary/Chest: Effort normal and breath sounds normal. No respiratory distress. She has no wheezes. She has no rales. She exhibits no tenderness.  Neurological: She is alert and oriented to person, place, and time.  Skin: Skin is warm and dry. No rash noted.  Psychiatric: Affect normal.  Vitals reviewed.   Recent Results (from the past 2160 hour(s))  Lipid panel     Status: None   Collection Time: 06/15/16  8:34 AM  Result Value Ref Range   Cholesterol 196 0 - 200 mg/dL    Comment: ATP  III Classification       Desirable:  < 200 mg/dL               Borderline High:  200 - 239 mg/dL          High:  > = 240 mg/dL   Triglycerides 78.0 0.0 - 149.0 mg/dL    Comment: Normal:  <150 mg/dLBorderline High:  150 - 199 mg/dL   HDL 81.90 >39.00 mg/dL   VLDL 15.6 0.0 - 40.0 mg/dL   LDL Cholesterol 98 0 - 99 mg/dL   Total CHOL/HDL Ratio 2     Comment:                Men          Women1/2 Average Risk     3.4          3.3Average Risk          5.0          4.42X Average Risk          9.6          7.13X Average Risk          15.0          11.0                       NonHDL 113.88     Comment: NOTE:  Non-HDL goal should be 30 mg/dL higher than patient's LDL goal (i.e. LDL goal of < 70 mg/dL, would have non-HDL goal of < 100 mg/dL)  Comp Met (CMET)     Status: Abnormal   Collection Time: 06/15/16  8:34 AM  Result Value Ref Range   Sodium 138 135 - 145 mEq/L   Potassium 4.4 3.5 - 5.1 mEq/L   Chloride 101 96 - 112 mEq/L   CO2 28 19 - 32 mEq/L   Glucose, Bld 232 (H) 70 - 99 mg/dL   BUN 20 6 - 23 mg/dL   Creatinine, Ser 0.82 0.40 - 1.20 mg/dL   Total Bilirubin 0.5 0.2 - 1.2 mg/dL   Alkaline Phosphatase 83 39 - 117 U/L   AST 13 0 - 37 U/L   ALT 15 0 - 35 U/L   Total Protein 7.1 6.0 - 8.3 g/dL   Albumin 4.5 3.5 - 5.2 g/dL   Calcium 10.2 8.4 - 10.5 mg/dL   GFR 74.25 >60.00 mL/min  Hemoglobin A1c     Status: Abnormal   Collection Time: 06/15/16  8:34 AM  Result Value Ref Range   Hgb A1c MFr Bld 8.4 (H) 4.6 - 6.5 %    Comment: Glycemic Control Guidelines for People with Diabetes:Non Diabetic:  <6%Goal  of Therapy: <7%Additional Action Suggested:  >8%   HM DIABETES EYE EXAM     Status: None   Collection Time: 07/08/16 12:00 AM  Result Value Ref Range   HM Diabetic Eye Exam No Retinopathy No Retinopathy  Comp Met (CMET)     Status: Abnormal   Collection Time: 07/13/16 11:44 AM  Result Value Ref Range   Sodium 139 135 - 145 mEq/L   Potassium 4.0 3.5 - 5.1 mEq/L   Chloride 103 96 - 112 mEq/L    CO2 27 19 - 32 mEq/L   Glucose, Bld 148 (H) 70 - 99 mg/dL   BUN 15 6 - 23 mg/dL   Creatinine, Ser 0.78 0.40 - 1.20 mg/dL   Total Bilirubin 0.4 0.2 - 1.2 mg/dL   Alkaline Phosphatase 77 39 - 117 U/L   AST 17 0 - 37 U/L   ALT 16 0 - 35 U/L   Total Protein 7.0 6.0 - 8.3 g/dL   Albumin 4.3 3.5 - 5.2 g/dL   Calcium 10.0 8.4 - 10.5 mg/dL   GFR 78.64 >60.00 mL/min  Vitamin D 1,25 dihydroxy     Status: None   Collection Time: 07/13/16 11:44 AM  Result Value Ref Range   Vitamin D 1, 25 (OH)2 Total 35 18 - 72 pg/mL   Vitamin D3 1, 25 (OH)2 35 pg/mL   Vitamin D2 1, 25 (OH)2 <8 pg/mL    Comment: Vitamin D3, 1,25(OH)2 indicates both endogenous production and supplementation.  Vitamin D2, 1,25(OH)2 is an indicator of exogeous sources, such as diet or supplementation.  Interpretation and therapy are based on measurement of Vitamin D,1,25(OH)2, Total. This test was developed and its analytical performance characteristics have been determined by [facility], [place], [place]. It has not been cleared or approved by the FDA. This assay has been validated pursuant to the CLIA regulations and is used for clinical purposes.    Assessment/Plan: Type 2 diabetes mellitus without complication, without long-term current use of insulin ([place]) Discussed increased exercise. Will continue working on diet. Refuses nutrition referral at present. Will add on Glucotrol 2.5 mg daily. Continue Metformin. Continue Ramipril as directed. Will repeat labs today. FU discussed.   Foot exam, eye exam and pneumonia vaccines up-to-date.    Osteopenia Noted on DEXA. FRAX score below level to treat as osteoporosis. Continue calcium-D supplementation. Will check vitamin D level today.   Vitamin D deficiency Check levels today.     Leeanne Rio, PA-C

## 2016-07-16 ENCOUNTER — Encounter: Payer: Self-pay | Admitting: Physician Assistant

## 2016-07-16 ENCOUNTER — Encounter: Payer: Self-pay | Admitting: Emergency Medicine

## 2016-07-16 DIAGNOSIS — E559 Vitamin D deficiency, unspecified: Secondary | ICD-10-CM | POA: Insufficient documentation

## 2016-07-16 DIAGNOSIS — M8580 Other specified disorders of bone density and structure, unspecified site: Secondary | ICD-10-CM | POA: Insufficient documentation

## 2016-07-16 LAB — VITAMIN D 1,25 DIHYDROXY
VITAMIN D 1, 25 (OH) TOTAL: 35 pg/mL (ref 18–72)
VITAMIN D3 1, 25 (OH): 35 pg/mL
Vitamin D2 1, 25 (OH)2: <8 pg/mL

## 2016-07-16 NOTE — Assessment & Plan Note (Signed)
Check levels today 

## 2016-07-16 NOTE — Assessment & Plan Note (Signed)
Discussed increased exercise. Will continue working on diet. Refuses nutrition referral at present. Will add on Glucotrol 2.5 mg daily. Continue Metformin. Continue Ramipril as directed. Will repeat labs today. FU discussed.   Foot exam, eye exam and pneumonia vaccines up-to-date.

## 2016-07-16 NOTE — Assessment & Plan Note (Signed)
Noted on DEXA. FRAX score below level to treat as osteoporosis. Continue calcium-D supplementation. Will check vitamin D level today.

## 2016-07-20 ENCOUNTER — Other Ambulatory Visit: Payer: Self-pay | Admitting: Physician Assistant

## 2016-08-05 DIAGNOSIS — Z23 Encounter for immunization: Secondary | ICD-10-CM | POA: Diagnosis not present

## 2016-08-10 ENCOUNTER — Ambulatory Visit (INDEPENDENT_AMBULATORY_CARE_PROVIDER_SITE_OTHER): Payer: Medicare Other | Admitting: Physician Assistant

## 2016-08-10 ENCOUNTER — Encounter: Payer: Self-pay | Admitting: Physician Assistant

## 2016-08-10 DIAGNOSIS — E119 Type 2 diabetes mellitus without complications: Secondary | ICD-10-CM | POA: Diagnosis not present

## 2016-08-10 MED ORDER — SITAGLIPTIN PHOSPHATE 25 MG PO TABS
25.0000 mg | ORAL_TABLET | Freq: Every day | ORAL | 0 refills | Status: DC
Start: 2016-08-10 — End: 2016-08-12

## 2016-08-10 NOTE — Assessment & Plan Note (Signed)
Fasting sugars improving. Up-to-date on HM parameters. BP well-controlled. Will add on Januvia 25 mg daily. FU 1 month. Will work on titration of Januvia and hopefully wean off of SU.

## 2016-08-10 NOTE — Progress Notes (Signed)
History of Present Illness: Patient is a 66 y.o. female who presents to clinic today for follow-up of Diabetes Mellitus II, previously uncontrolled without complication.  Patient currently on medication regimen of Metformin XR 1000 mg BID and Gluocotrol XL 2.5 mg daily.  Is taking medications as directed. Endorses trying to be more consistent with her diet.  Is working on exercise regimen. Is checking fasting blood glucose as directed. Notes improvement in fasting sugars but still above goal. Averaging 160s.   Latest Maintenance: A1C --  Lab Results  Component Value Date   HGBA1C 8.4 (H) 06/15/2016   Diabetic Eye Exam -- up-to-date. Urine Microalbumin -- ACEI Foot Exam -- up-to-date  Past Medical History:  Diagnosis Date  . Anxiety   . Diabetes mellitus without complication (HCC)    per pt. borderline diabetic  . Fibrocystic disease of breast   . Mood swings ([place])     Current Outpatient Prescriptions on File Prior to Visit  Medication Sig Dispense Refill  . aspirin 81 MG EC tablet Take 81 mg by mouth daily.    Marland Kitchen atorvastatin (LIPITOR) 10 MG tablet TAKE 1 TABLET(10 MG) BY MOUTH AT BEDTIME 30 tablet 0  . Blood Glucose Monitoring Suppl (TRUETRACK BLOOD GLUCOSE) W/DEVICE KIT CHECK BLOOD SUGAR FASTING AND TWO HOURS AFTER HER LARGEST MEAL.    . Calcium Carb-Cholecalciferol (CALCIUM-VITAMIN D) 600-400 MG-UNIT TABS Take 2 each by mouth daily.    Marland Kitchen FLUoxetine (PROZAC) 40 MG capsule TAKE 1 CAPSULE(40 MG) BY MOUTH DAILY 90 capsule 0  . fluticasone (FLONASE) 50 MCG/ACT nasal spray Place 2 sprays into both nostrils daily. 16 g 6  . glipiZIDE (GLUCOTROL XL) 2.5 MG 24 hr tablet Take 1 tablet (2.5 mg total) by mouth daily with breakfast. 30 tablet 1  . metFORMIN (GLUCOPHAGE-XR) 500 MG 24 hr tablet TAKE 2 TABLETS(1000 MG) BY MOUTH TWICE DAILY WITH A MEAL 360 tablet 0  . OVER THE COUNTER MEDICATION Multiple Vitamins-Minerals-Take 1 tablet by mouth daily.    . ramipril (ALTACE) 2.5 MG capsule TAKE  1 CAPSULE(2.5 MG) BY MOUTH DAILY 90 capsule 0  . ALPRAZolam (XANAX) 0.25 MG tablet TAKE 1/2- 1 TABLET EVERY 12 HOURS AS NEEDED FOR ANXIETY (Patient not taking: Reported on 08/10/2016) 30 tablet 2   No current facility-administered medications on file prior to visit.     No Known Allergies  Family History  Problem Relation Age of Onset  . Stroke Mother   . Heart attack Father   . Alzheimer's disease Sister     #1-Deceased  . Breast cancer Sister     #1  . Healthy Sister     #2    Social History   Social History  . Marital status: Married    Spouse name: N/A  . Number of children: 1  . Years of education: N/A   Occupational History  . Statistician    Social History Main Topics  . Smoking status: Never Smoker  . Smokeless tobacco: Never Used  . Alcohol use 0.0 oz/week     Comment: occas.  . Drug use: No  . Sexual activity: Yes    Partners: Male   Other Topics Concern  . None   Social History Narrative  . None   Review of Systems: Pertinent ROS are listed in HPI  Physical Examination: BP 118/76   Pulse 68   Temp 97.6 F (36.4 C) (Oral)   Resp 14   Ht '5\' 3"'$  (1.6 m)   Wt 158 lb (  71.7 kg)   SpO2 98%   BMI 27.99 kg/m  General appearance: alert, cooperative, appears stated age and no distress Eyes: conjunctivae/corneas clear. PERRL, EOM's intact. Fundi benign. Nose: Nares normal. Septum midline. Mucosa normal. No drainage or sinus tenderness. Throat: lips, mucosa, and tongue normal; teeth and gums normal Lungs: clear to auscultation bilaterally Heart: regular rate and rhythm, S1, S2 normal, no murmur, click, rub or gallop Neurologic: Alert and oriented X 3, normal strength and tone. Normal symmetric reflexes. Normal coordination and gait  Assessment/Plan: Type 2 diabetes mellitus without complication, without long-term current use of insulin (HCC) Fasting sugars improving. Up-to-date on HM parameters. BP well-controlled. Will add on Januvia 25 mg daily.  FU 1 month. Will work on titration of Januvia and hopefully wean off of SU.

## 2016-08-10 NOTE — Patient Instructions (Signed)
Please keep up with diet and exercise. I am impressed with the changes you have made.  I do want you to continue Metformin XR at 1000 mg twice daily -- take at breakfast and dinner time.  Continue the Glucotrol for now.  I am adding on a very low dose of Januvia to help with sugar levels. We will hopefully be able to go up on this and get you off the Glucotrol XL.   Check fasting sugars and bedtime sugars as directed. If < 80, eat a little snack and call me.   Follow-up with me on the phone in 1 week with sugar readings.

## 2016-08-10 NOTE — Progress Notes (Signed)
Pre visit review using our clinic review tool, if applicable. No additional management support is needed unless otherwise documented below in the visit note. 

## 2016-08-11 ENCOUNTER — Encounter: Payer: Self-pay | Admitting: Physician Assistant

## 2016-08-11 ENCOUNTER — Telehealth: Payer: Self-pay | Admitting: Physician Assistant

## 2016-08-11 NOTE — Telephone Encounter (Signed)
Spoke with patient advised per Tonya Tapia for patient to contact her insurance co and see what medication is covered thru her formulary. She is agreeable. When she went to pick up rx $320. Pharmacist she would need to meet her deductible and the cost would go down but unsure of what the cost would be.

## 2016-08-11 NOTE — Telephone Encounter (Signed)
Patient states she went to pharmacy to pick up rx for sitaGLIPtin (JANUVIA) 25 MG tablet and it is too expensive.  She would like to know if pcp can prescribe an alternate medicine, if not what other options does she have.

## 2016-08-12 MED ORDER — EMPAGLIFLOZIN 10 MG PO TABS: 10.0000 mg | ORAL_TABLET | Freq: Every day | ORAL | 1 refills | Status: DC

## 2016-08-13 ENCOUNTER — Encounter: Payer: Self-pay | Admitting: Physician Assistant

## 2016-08-14 MED ORDER — LIRAGLUTIDE 18 MG/3ML ~~LOC~~ SOPN: 0.6000 mg | PEN_INJECTOR | Freq: Every day | SUBCUTANEOUS | 1 refills | Status: DC

## 2016-08-22 ENCOUNTER — Encounter: Payer: Self-pay | Admitting: Physician Assistant

## 2016-08-25 ENCOUNTER — Other Ambulatory Visit: Payer: Self-pay | Admitting: Physician Assistant

## 2016-08-31 ENCOUNTER — Other Ambulatory Visit: Payer: Self-pay | Admitting: Emergency Medicine

## 2016-08-31 MED ORDER — LIRAGLUTIDE 18 MG/3ML ~~LOC~~ SOPN: 1.2000 mg | PEN_INJECTOR | Freq: Every day | SUBCUTANEOUS | 1 refills | Status: DC

## 2016-09-08 ENCOUNTER — Other Ambulatory Visit: Payer: Self-pay | Admitting: Emergency Medicine

## 2016-09-08 MED ORDER — LIRAGLUTIDE 18 MG/3ML ~~LOC~~ SOPN: 1.2000 mg | PEN_INJECTOR | Freq: Every day | SUBCUTANEOUS | 3 refills | Status: DC

## 2016-09-22 ENCOUNTER — Encounter: Payer: Self-pay | Admitting: Physician Assistant

## 2016-09-22 ENCOUNTER — Ambulatory Visit: Payer: Self-pay | Admitting: Physician Assistant

## 2016-09-24 ENCOUNTER — Encounter: Payer: Self-pay | Admitting: Physician Assistant

## 2016-09-24 ENCOUNTER — Ambulatory Visit (INDEPENDENT_AMBULATORY_CARE_PROVIDER_SITE_OTHER): Payer: Medicare Other | Admitting: Physician Assistant

## 2016-09-24 VITALS — BP 100/68 | HR 73 | Temp 97.3°F | Resp 14 | Ht 63.0 in | Wt 144.0 lb

## 2016-09-24 DIAGNOSIS — E119 Type 2 diabetes mellitus without complications: Secondary | ICD-10-CM | POA: Diagnosis not present

## 2016-09-24 LAB — COMPREHENSIVE METABOLIC PANEL
ALK PHOS: 73 U/L (ref 39–117)
ALT: 19 U/L (ref 0–35)
AST: 20 U/L (ref 0–37)
Albumin: 4.8 g/dL (ref 3.5–5.2)
BILIRUBIN TOTAL: 0.5 mg/dL (ref 0.2–1.2)
BUN: 17 mg/dL (ref 6–23)
CALCIUM: 10.7 mg/dL — AB (ref 8.4–10.5)
CO2: 27 mEq/L (ref 19–32)
Chloride: 102 mEq/L (ref 96–112)
Creatinine, Ser: 0.79 mg/dL (ref 0.40–1.20)
GFR: 77.45 mL/min (ref >60.00)
Glucose, Bld: 137 mg/dL — ABNORMAL HIGH (ref 70–99)
Potassium: 4.1 mEq/L (ref 3.5–5.1)
Sodium: 140 mEq/L (ref 135–145)
TOTAL PROTEIN: 7.3 g/dL (ref 6.0–8.3)

## 2016-09-24 LAB — HEMOGLOBIN A1C: Hgb A1c MFr Bld: 6.8 % — ABNORMAL HIGH (ref 4.6–6.5)

## 2016-09-24 NOTE — Assessment & Plan Note (Signed)
Glucose improving. Will recheck labs today. New meter provided. Immunizations up-to-date. Foot exam, eye exam up-to-date.  Is tolerating ACE I well with good BP control. Significant decreased appetite with the Victoza. Will check on coverage for once weekly Trulicity to use instead.  F/U 3-6 months depending on results.

## 2016-09-24 NOTE — Patient Instructions (Signed)
Please go to the lab for blood work. I will call you with your results. Work on adding in a healthy snack between your 3 meals.   We will be continuing your metformin. I would like to stop the Victoza and start a once weekly Trulicity -- I am hoping it will give you the same or better sugar reduction without the significant decrease in appetite.   You have endorsed you want to check with insurance. Please let me know when you have spoke with them so I can send in the new medication.  Please use the new meter given. If you like it we will send in further testing supplies for this new meter.  Follow-up will be based on lab results.

## 2016-09-24 NOTE — Progress Notes (Signed)
History of Present Illness: Patient is a 66 y.o. female who presents to clinic today for follow-up of Diabetes Mellitus II, previously uncontrolled.  Patient currently on medication regimen of Metformin XR 2000 mg per day and Victoza 1.2 mg daily.  Is taking medications as directed. Endorses improvement in sugar levels.  Denies any vision changes, polyuria, urinary frequency, neuropathy symptoms. Is checking blood glucose as directed. Fasting sugars averaging 110-150. Has had a couple of outliers up to 200 when she was under the weather. Notes significant decrease in appetite with the Victoza, slightly worsened when she titrated up to 1.2 mg dosing. I s eating a boiled egg for breakfast, sandwich or soup for lunch, vegetables and occasional meat for dinner. Has to make herself eat. Has lost 10 pounds since starting this medication.   Latest Maintenance: A1C --  Lab Results  Component Value Date   HGBA1C 8.4 (H) 06/15/2016   Diabetic Eye Exam -- up-to-date Urine Microalbumin -- up-to-date Foot Exam -- up-to-date  Past Medical History:  Diagnosis Date  . Anxiety   . Diabetes mellitus without complication (HCC)    per pt. borderline diabetic  . Fibrocystic disease of breast   . Mood swings ([place])     Current Outpatient Prescriptions on File Prior to Visit  Medication Sig Dispense Refill  . aspirin 81 MG EC tablet Take 81 mg by mouth daily.    Marland Kitchen atorvastatin (LIPITOR) 10 MG tablet TAKE 1 TABLET(10 MG) BY MOUTH AT BEDTIME 90 tablet 1  . Blood Glucose Monitoring Suppl (TRUETRACK BLOOD GLUCOSE) W/DEVICE KIT CHECK BLOOD SUGAR FASTING AND TWO HOURS AFTER HER LARGEST MEAL.    . Calcium Carb-Cholecalciferol (CALCIUM-VITAMIN D) 600-400 MG-UNIT TABS Take 2 each by mouth daily.    Marland Kitchen FLUoxetine (PROZAC) 40 MG capsule TAKE 1 CAPSULE(40 MG) BY MOUTH DAILY 90 capsule 1  . fluticasone (FLONASE) 50 MCG/ACT nasal spray Place 2 sprays into both nostrils daily. 16 g 6  . liraglutide 18 MG/3ML SOPN Inject  0.2 mLs (1.2 mg total) into the skin daily. 6 mL 3  . metFORMIN (GLUCOPHAGE-XR) 500 MG 24 hr tablet TAKE 2 TABLETS(1000 MG) BY MOUTH TWICE DAILY WITH A MEAL 360 tablet 0  . OVER THE COUNTER MEDICATION Multiple Vitamins-Minerals-Take 1 tablet by mouth daily.    . ramipril (ALTACE) 2.5 MG capsule TAKE 1 CAPSULE(2.5 MG) BY MOUTH DAILY 90 capsule 1   No current facility-administered medications on file prior to visit.     No Known Allergies  Family History  Problem Relation Age of Onset  . Stroke Mother   . Heart attack Father   . Alzheimer's disease Sister     #1-Deceased  . Breast cancer Sister     #1  . Healthy Sister     #2    Social History   Social History  . Marital status: Married    Spouse name: N/A  . Number of children: 1  . Years of education: N/A   Occupational History  . Statistician    Social History Main Topics  . Smoking status: Never Smoker  . Smokeless tobacco: Never Used  . Alcohol use 0.0 oz/week     Comment: occas.  . Drug use: No  . Sexual activity: Yes    Partners: Male   Other Topics Concern  . None   Social History Narrative  . None   Review of Systems: Pertinent ROS are listed in HPI  Physical Examination: BP 100/68   Pulse 73  Temp 97.3 F (36.3 C) (Oral)   Resp 14   Ht '5\' 3"'$  (1.6 m)   Wt 144 lb (65.3 kg)   SpO2 97%   BMI 25.51 kg/m  General appearance: alert, cooperative, appears stated age and no distress Lungs: clear to auscultation bilaterally Heart: regular rate and rhythm, S1, S2 normal, no murmur, click, rub or gallop Neurologic: Grossly normal  Assessment/Plan: Type 2 diabetes mellitus without complication, without long-term current use of insulin (HCC) Glucose improving. Will recheck labs today. New meter provided. Immunizations up-to-date. Foot exam, eye exam up-to-date.  Is tolerating ACE I well with good BP control. Significant decreased appetite with the Victoza. Will check on coverage for once weekly  Trulicity to use instead.  F/U 3-6 months depending on results.

## 2016-09-24 NOTE — Progress Notes (Signed)
Pre visit review using our clinic review tool, if applicable. No additional management support is needed unless otherwise documented below in the visit note. 

## 2016-09-28 ENCOUNTER — Encounter: Payer: Self-pay | Admitting: Physician Assistant

## 2016-09-28 MED ORDER — GLUCOSE BLOOD VI STRP: ORAL_STRIP | 12 refills | Status: DC

## 2016-10-01 ENCOUNTER — Other Ambulatory Visit: Payer: Self-pay | Admitting: Physician Assistant

## 2016-10-03 ENCOUNTER — Encounter: Payer: Self-pay | Admitting: Physician Assistant

## 2016-10-05 ENCOUNTER — Other Ambulatory Visit: Payer: Self-pay | Admitting: Emergency Medicine

## 2016-10-05 DIAGNOSIS — E119 Type 2 diabetes mellitus without complications: Secondary | ICD-10-CM

## 2016-10-05 MED ORDER — GLUCOSE BLOOD VI STRP: ORAL_STRIP | 12 refills | Status: DC

## 2016-10-05 MED ORDER — TRUETRACK SMART SYSTEM KIT: 1.0000 | PACK | Freq: Every day | 0 refills | Status: AC

## 2016-10-05 MED ORDER — GLUCOSE BLOOD VI STRP
ORAL_STRIP | 12 refills | Status: DC
Start: 2016-10-05 — End: 2016-10-05

## 2016-10-18 ENCOUNTER — Other Ambulatory Visit: Payer: Self-pay | Admitting: Physician Assistant

## 2016-10-19 NOTE — Telephone Encounter (Signed)
Xanax d/c 08/14/16 Please advise of refill

## 2016-10-20 NOTE — Telephone Encounter (Signed)
Rx faxed to [facility] pharmacy.

## 2016-11-03 ENCOUNTER — Encounter: Payer: Self-pay | Admitting: Physician Assistant

## 2016-11-03 MED ORDER — INSULIN PEN NEEDLE 32G X 4 MM MISC: 1.0000 | Freq: Every day | 3 refills | Status: DC

## 2016-11-06 ENCOUNTER — Encounter: Payer: Self-pay | Admitting: Physician Assistant

## 2017-01-04 ENCOUNTER — Other Ambulatory Visit: Payer: Self-pay | Admitting: Physician Assistant

## 2017-01-08 ENCOUNTER — Other Ambulatory Visit: Payer: Self-pay | Admitting: Physician Assistant

## 2017-01-20 ENCOUNTER — Telehealth: Payer: Self-pay

## 2017-01-20 NOTE — Telephone Encounter (Signed)
LM requesting call back to schedule AWV with [facility].  

## 2017-02-23 ENCOUNTER — Encounter: Payer: Self-pay | Admitting: Physician Assistant

## 2017-02-23 ENCOUNTER — Other Ambulatory Visit: Payer: Self-pay | Admitting: Emergency Medicine

## 2017-02-23 MED ORDER — ATORVASTATIN CALCIUM 10 MG PO TABS: ORAL_TABLET | ORAL | 1 refills | Status: DC

## 2017-03-10 ENCOUNTER — Telehealth: Payer: Self-pay | Admitting: Physician Assistant

## 2017-03-10 ENCOUNTER — Other Ambulatory Visit: Payer: Self-pay | Admitting: Physician Assistant

## 2017-03-10 MED ORDER — FLUOXETINE HCL 40 MG PO CAPS: ORAL_CAPSULE | ORAL | 1 refills | Status: DC

## 2017-03-10 MED ORDER — RAMIPRIL 2.5 MG PO CAPS: ORAL_CAPSULE | ORAL | 1 refills | Status: DC

## 2017-03-10 NOTE — Telephone Encounter (Signed)
Sent refill of the requested medications Fluoxetine and Ramipril. Per Einar Pheasant patient is to schedule a follow up appointment in 6 months. Last OV 09/24/16. She is due for a follow up around Sept 22,2018 to recheck diabetes level.

## 2017-03-10 NOTE — Telephone Encounter (Signed)
Patient states she is at the pharmacy to pick up medication.  The pharmacy is telling her they have previously sent refill request to office but has not received a response back.  She states this has happened previously and wants to know why the problem with our office responding back to pharmacy.  I advised patient I did not see any recent refill requests in her chart from pharmacy and asked her to verify the pharmacy was sending request to the correct office.    Patient states there were and we need to figure out where the breakdown is occurring to prevent this from occurring again.  In the meantime, she is requesting refills of the following:  ramipril (ALTACE) 2.5 MG capsule  FLUoxetine (PROZAC) 40 MG capsule   Pharmacy:  [facility], [place] - [address] AT [place] [phone] (Phone) [phone] (Fax)

## 2017-04-02 ENCOUNTER — Encounter (HOSPITAL_BASED_OUTPATIENT_CLINIC_OR_DEPARTMENT_OTHER): Payer: Self-pay | Admitting: *Deleted

## 2017-04-02 ENCOUNTER — Emergency Department (HOSPITAL_BASED_OUTPATIENT_CLINIC_OR_DEPARTMENT_OTHER): Payer: Medicare Other

## 2017-04-02 ENCOUNTER — Inpatient Hospital Stay (HOSPITAL_BASED_OUTPATIENT_CLINIC_OR_DEPARTMENT_OTHER)
Admission: EM | Admit: 2017-04-02 | Discharge: 2017-04-09 | DRG: 418 | Disposition: A | Discharge status: Home / Self Care | Payer: Medicare Other | Attending: Internal Medicine | Admitting: Internal Medicine

## 2017-04-02 DIAGNOSIS — K828 Other specified diseases of gallbladder: Secondary | ICD-10-CM | POA: Diagnosis present

## 2017-04-02 DIAGNOSIS — K8051 Calculus of bile duct without cholangitis or cholecystitis with obstruction: Secondary | ICD-10-CM | POA: Diagnosis not present

## 2017-04-02 DIAGNOSIS — E890 Postprocedural hypothyroidism: Secondary | ICD-10-CM

## 2017-04-02 DIAGNOSIS — Z79899 Other long term (current) drug therapy: Secondary | ICD-10-CM

## 2017-04-02 DIAGNOSIS — K810 Acute cholecystitis: Secondary | ICD-10-CM | POA: Diagnosis not present

## 2017-04-02 DIAGNOSIS — K858 Other acute pancreatitis without necrosis or infection: Secondary | ICD-10-CM | POA: Diagnosis not present

## 2017-04-02 DIAGNOSIS — F419 Anxiety disorder, unspecified: Secondary | ICD-10-CM | POA: Diagnosis present

## 2017-04-02 DIAGNOSIS — K869 Disease of pancreas, unspecified: Secondary | ICD-10-CM | POA: Diagnosis not present

## 2017-04-02 DIAGNOSIS — K8689 Other specified diseases of pancreas: Secondary | ICD-10-CM | POA: Diagnosis present

## 2017-04-02 DIAGNOSIS — R079 Chest pain, unspecified: Secondary | ICD-10-CM | POA: Diagnosis not present

## 2017-04-02 DIAGNOSIS — E119 Type 2 diabetes mellitus without complications: Secondary | ICD-10-CM | POA: Diagnosis not present

## 2017-04-02 DIAGNOSIS — Z794 Long term (current) use of insulin: Secondary | ICD-10-CM

## 2017-04-02 DIAGNOSIS — Z7982 Long term (current) use of aspirin: Secondary | ICD-10-CM | POA: Diagnosis not present

## 2017-04-02 DIAGNOSIS — Z9889 Other specified postprocedural states: Secondary | ICD-10-CM | POA: Diagnosis not present

## 2017-04-02 DIAGNOSIS — R1013 Epigastric pain: Secondary | ICD-10-CM | POA: Diagnosis not present

## 2017-04-02 DIAGNOSIS — Z419 Encounter for procedure for purposes other than remedying health state, unspecified: Secondary | ICD-10-CM

## 2017-04-02 DIAGNOSIS — K8010 Calculus of gallbladder with chronic cholecystitis without obstruction: Secondary | ICD-10-CM | POA: Diagnosis not present

## 2017-04-02 DIAGNOSIS — E559 Vitamin D deficiency, unspecified: Secondary | ICD-10-CM | POA: Diagnosis not present

## 2017-04-02 DIAGNOSIS — K8044 Calculus of bile duct with chronic cholecystitis without obstruction: Secondary | ICD-10-CM

## 2017-04-02 DIAGNOSIS — R945 Abnormal results of liver function studies: Secondary | ICD-10-CM | POA: Diagnosis not present

## 2017-04-02 DIAGNOSIS — K851 Biliary acute pancreatitis without necrosis or infection: Principal | ICD-10-CM | POA: Diagnosis present

## 2017-04-02 DIAGNOSIS — K8050 Calculus of bile duct without cholangitis or cholecystitis without obstruction: Secondary | ICD-10-CM

## 2017-04-02 DIAGNOSIS — R7989 Other specified abnormal findings of blood chemistry: Secondary | ICD-10-CM | POA: Diagnosis present

## 2017-04-02 DIAGNOSIS — Z9009 Acquired absence of other part of head and neck: Secondary | ICD-10-CM

## 2017-04-02 DIAGNOSIS — R740 Nonspecific elevation of levels of transaminase and lactic acid dehydrogenase [LDH]: Secondary | ICD-10-CM | POA: Diagnosis present

## 2017-04-02 DIAGNOSIS — Z9049 Acquired absence of other specified parts of digestive tract: Secondary | ICD-10-CM

## 2017-04-02 DIAGNOSIS — K8000 Calculus of gallbladder with acute cholecystitis without obstruction: Secondary | ICD-10-CM | POA: Diagnosis not present

## 2017-04-02 DIAGNOSIS — K8500 Idiopathic acute pancreatitis without necrosis or infection: Secondary | ICD-10-CM | POA: Diagnosis not present

## 2017-04-02 DIAGNOSIS — K8066 Calculus of gallbladder and bile duct with acute and chronic cholecystitis without obstruction: Secondary | ICD-10-CM | POA: Diagnosis present

## 2017-04-02 DIAGNOSIS — Z9089 Acquired absence of other organs: Secondary | ICD-10-CM | POA: Diagnosis not present

## 2017-04-02 DIAGNOSIS — F418 Other specified anxiety disorders: Secondary | ICD-10-CM | POA: Diagnosis not present

## 2017-04-02 DIAGNOSIS — G43909 Migraine, unspecified, not intractable, without status migrainosus: Secondary | ICD-10-CM | POA: Diagnosis not present

## 2017-04-02 DIAGNOSIS — K859 Acute pancreatitis without necrosis or infection, unspecified: Secondary | ICD-10-CM

## 2017-04-02 DIAGNOSIS — R938 Abnormal findings on diagnostic imaging of other specified body structures: Secondary | ICD-10-CM | POA: Diagnosis not present

## 2017-04-02 DIAGNOSIS — K8020 Calculus of gallbladder without cholecystitis without obstruction: Secondary | ICD-10-CM | POA: Diagnosis not present

## 2017-04-02 LAB — COMPREHENSIVE METABOLIC PANEL
ALK PHOS: 443 U/L — AB (ref 38–126)
ALT: 673 U/L — AB (ref 14–54)
AST: 696 U/L — AB (ref 15–41)
Albumin: 3.9 g/dL (ref 3.5–5.0)
Anion gap: 11 (ref 5–15)
BUN: 18 mg/dL (ref 6–20)
CALCIUM: 10 mg/dL (ref 8.9–10.3)
CHLORIDE: 99 mmol/L — AB (ref 101–111)
CO2: 28 mmol/L (ref 22–32)
Creatinine, Ser: 0.77 mg/dL (ref 0.44–1.00)
GFR calc Af Amer: >60 mL/min (ref >60)
GFR calc non Af Amer: >60 mL/min (ref >60)
Glucose, Bld: 204 mg/dL — ABNORMAL HIGH (ref 65–99)
Potassium: 4.2 mmol/L (ref 3.5–5.1)
Sodium: 138 mmol/L (ref 135–145)
TOTAL PROTEIN: 7.2 g/dL (ref 6.5–8.1)
Total Bilirubin: 1.3 mg/dL — ABNORMAL HIGH (ref 0.3–1.2)

## 2017-04-02 LAB — CBC
HCT: 37.6 % (ref 36.0–46.0)
Hemoglobin: 12.8 g/dL (ref 12.0–15.0)
MCH: 30 pg (ref 26.0–34.0)
MCHC: 34 g/dL (ref 30.0–36.0)
MCV: 88.1 fL (ref 78.0–100.0)
PLATELETS: 234 10*3/uL (ref 150–400)
RBC: 4.27 MIL/uL (ref 3.87–5.11)
RDW: 13.3 % (ref 11.5–15.5)
WBC: 12.7 10*3/uL — ABNORMAL HIGH (ref 4.0–10.5)

## 2017-04-02 LAB — CBC WITH DIFFERENTIAL/PLATELET
Basophils Absolute: 0.1 10*3/uL (ref 0.0–0.1)
Basophils Relative: 1 %
EOS ABS: 0.5 10*3/uL (ref 0.0–0.7)
Eosinophils Relative: 4 %
HEMATOCRIT: 39.8 % (ref 36.0–46.0)
HEMOGLOBIN: 13.5 g/dL (ref 12.0–15.0)
Lymphocytes Relative: 19 %
Lymphs Abs: 2.6 10*3/uL (ref 0.7–4.0)
MCH: 30 pg (ref 26.0–34.0)
MCHC: 33.9 g/dL (ref 30.0–36.0)
MCV: 88.4 fL (ref 78.0–100.0)
Monocytes Absolute: 1.3 10*3/uL — ABNORMAL HIGH (ref 0.1–1.0)
Monocytes Relative: 10 %
NEUTROS ABS: 9 10*3/uL — AB (ref 1.7–7.7)
NEUTROS PCT: 66 %
PLATELETS: 290 10*3/uL (ref 150–400)
RBC: 4.5 MIL/uL (ref 3.87–5.11)
RDW: 12.8 % (ref 11.5–15.5)
WBC: 13.5 10*3/uL — AB (ref 4.0–10.5)

## 2017-04-02 LAB — GLUCOSE, CAPILLARY
GLUCOSE-CAPILLARY: 142 mg/dL — AB (ref 65–99)
GLUCOSE-CAPILLARY: 113 mg/dL — AB (ref 65–99)

## 2017-04-02 LAB — CREATININE, SERUM
Creatinine, Ser: 0.71 mg/dL (ref 0.44–1.00)
GFR calc Af Amer: >60 mL/min (ref >60)
GFR calc non Af Amer: >60 mL/min (ref >60)

## 2017-04-02 LAB — CBG MONITORING, ED
GLUCOSE-CAPILLARY: 174 mg/dL — AB (ref 65–99)
Glucose-Capillary: 152 mg/dL — ABNORMAL HIGH (ref 65–99)

## 2017-04-02 LAB — TROPONIN I

## 2017-04-02 LAB — LIPASE, BLOOD: Lipase: 7620 U/L — ABNORMAL HIGH (ref 11–51)

## 2017-04-02 MED ORDER — PIPERACILLIN-TAZOBACTAM 3.375 G IVPB 30 MIN
3.3750 g | Freq: Once | INTRAVENOUS | Status: AC
  Administered 2017-04-02: 3.375 g via INTRAVENOUS
  Filled 2017-04-02 (×2): qty 50

## 2017-04-02 MED ORDER — ONDANSETRON HCL 4 MG/2ML IJ SOLN
INTRAMUSCULAR | Status: AC
  Filled 2017-04-02: qty 2

## 2017-04-02 MED ORDER — DIPHENHYDRAMINE HCL 50 MG/ML IJ SOLN
25.0000 mg | Freq: Once | INTRAMUSCULAR | Status: AC
  Administered 2017-04-02: 25 mg via INTRAVENOUS
  Filled 2017-04-02: qty 1

## 2017-04-02 MED ORDER — DICYCLOMINE HCL 10 MG/ML IM SOLN
20.0000 mg | Freq: Once | INTRAMUSCULAR | Status: AC
  Administered 2017-04-02: 20 mg via INTRAMUSCULAR
  Filled 2017-04-02: qty 2

## 2017-04-02 MED ORDER — HYDRALAZINE HCL 20 MG/ML IJ SOLN: 5.0000 mg | INTRAMUSCULAR | Status: DC | PRN

## 2017-04-02 MED ORDER — PROCHLORPERAZINE EDISYLATE 5 MG/ML IJ SOLN
10.0000 mg | Freq: Once | INTRAMUSCULAR | Status: AC
  Administered 2017-04-02: 10 mg via INTRAVENOUS
  Filled 2017-04-02: qty 2

## 2017-04-02 MED ORDER — MORPHINE SULFATE (PF) 4 MG/ML IV SOLN
4.0000 mg | Freq: Once | INTRAVENOUS | Status: AC
  Administered 2017-04-02: 4 mg via INTRAVENOUS
  Filled 2017-04-02: qty 1

## 2017-04-02 MED ORDER — ENOXAPARIN SODIUM 40 MG/0.4ML ~~LOC~~ SOLN
40.0000 mg | SUBCUTANEOUS | Status: DC
  Administered 2017-04-02: 40 mg via SUBCUTANEOUS
  Filled 2017-04-02: qty 0.4

## 2017-04-02 MED ORDER — ONDANSETRON HCL 4 MG/2ML IJ SOLN
4.0000 mg | Freq: Three times a day (TID) | INTRAMUSCULAR | Status: DC | PRN
  Administered 2017-04-05: 4 mg via INTRAVENOUS

## 2017-04-02 MED ORDER — GI COCKTAIL ~~LOC~~
30.0000 mL | Freq: Once | ORAL | Status: AC
  Administered 2017-04-02: 30 mL via ORAL
  Filled 2017-04-02: qty 30

## 2017-04-02 MED ORDER — FENTANYL CITRATE (PF) 100 MCG/2ML IJ SOLN
50.0000 ug | Freq: Once | INTRAMUSCULAR | Status: AC
  Administered 2017-04-02: 50 ug via INTRAVENOUS
  Filled 2017-04-02: qty 2

## 2017-04-02 MED ORDER — ONDANSETRON HCL 4 MG/2ML IJ SOLN
4.0000 mg | Freq: Once | INTRAMUSCULAR | Status: AC
  Administered 2017-04-02: 4 mg via INTRAVENOUS

## 2017-04-02 MED ORDER — INSULIN ASPART 100 UNIT/ML ~~LOC~~ SOLN
0.0000 [IU] | SUBCUTANEOUS | Status: DC
  Administered 2017-04-03: 2 [IU] via SUBCUTANEOUS

## 2017-04-02 MED ORDER — MORPHINE SULFATE (PF) 4 MG/ML IV SOLN: 2.0000 mg | INTRAVENOUS | Status: DC | PRN

## 2017-04-02 MED ORDER — INSULIN ASPART 100 UNIT/ML ~~LOC~~ SOLN
0.0000 [IU] | Freq: Every day | SUBCUTANEOUS | Status: DC
Start: 2017-04-02 — End: 2017-04-02

## 2017-04-02 MED ORDER — IOPAMIDOL (ISOVUE-300) INJECTION 61%
100.0000 mL | Freq: Once | INTRAVENOUS | Status: AC | PRN
  Administered 2017-04-02: 100 mL via INTRAVENOUS

## 2017-04-02 MED ORDER — VANCOMYCIN HCL IN DEXTROSE 1-5 GM/200ML-% IV SOLN
1000.0000 mg | Freq: Once | INTRAVENOUS | Status: AC
  Administered 2017-04-02: 1000 mg via INTRAVENOUS
  Filled 2017-04-02: qty 200

## 2017-04-02 MED ORDER — SODIUM CHLORIDE 0.9 % IV SOLN
INTRAVENOUS | Status: DC
  Administered 2017-04-02: 18:00:00 via INTRAVENOUS
  Administered 2017-04-02: 1000 mL via INTRAVENOUS
  Administered 2017-04-03 – 2017-04-06 (×7): via INTRAVENOUS

## 2017-04-02 MED ORDER — MORPHINE SULFATE (PF) 2 MG/ML IV SOLN: 2.0000 mg | INTRAVENOUS | Status: DC | PRN

## 2017-04-02 MED ORDER — INSULIN ASPART 100 UNIT/ML ~~LOC~~ SOLN
0.0000 [IU] | Freq: Three times a day (TID) | SUBCUTANEOUS | Status: DC
  Administered 2017-04-02: 2 [IU] via SUBCUTANEOUS

## 2017-04-02 NOTE — ED Notes (Signed)
Report to Maudie Mercury, Therapist, sports at [employer].

## 2017-04-02 NOTE — ED Notes (Signed)
Pt amb to BR

## 2017-04-02 NOTE — ED Notes (Signed)
Pt vomited a large amount after g I cocktail

## 2017-04-02 NOTE — ED Notes (Signed)
Pt amb to BR w/o difficulty 

## 2017-04-02 NOTE — ED Triage Notes (Signed)
C/o epigastric pain onset last pm  Pain increased w deep breath,  nausea

## 2017-04-02 NOTE — ED Notes (Signed)
Pt on auto VS  

## 2017-04-02 NOTE — ED Notes (Signed)
Up to BR without assistance

## 2017-04-02 NOTE — Consult Note (Signed)
Surgical Consultation Requesting provider: Dr. Blaine Hamper  CC: abdominal pain  HPI: 66 year old woman presented to [facility] with epigastric pain, vomiting. She has had issues with this about every other day for the last 2 weeks. She's had symptoms similar many times in the past but is usually relieved by  Alka-Seltzer. The pain is associated with eating and is in the right upper quadrant and epigastrium. It is sharp in quality. Has not found any aggravating or alleviating symptoms. Her workup in the emergency department revealed normal vital signs, elevated liver enzymes and lipase but normal bilirubin, and a CT scan as shown below. At this time her pain and nausea have subsided but she complains of headache and fatigue.  No Known Allergies  Past Medical History:  Diagnosis Date  . Anxiety   . Diabetes mellitus without complication (HCC)    per pt. borderline diabetic  . Fibrocystic disease of breast   . Mood swings ([place])     Past Surgical History:  Procedure Laterality Date  . BREAST BIOPSY  1989  . FOOT SURGERY  1991  . PILONIDAL CYST EXCISION  1971  . THYROIDECTOMY Right 2005  . TONSILLECTOMY      Family History  Problem Relation Age of Onset  . Stroke Mother   . Heart attack Father   . Alzheimer's disease Sister        #1-Deceased  . Breast cancer Sister        #1  . Healthy Sister        #2    Social History   Social History  . Marital status: Married    Spouse name: N/A  . Number of children: 1  . Years of education: N/A   Occupational History  . Statistician    Social History Main Topics  . Smoking status: Never Smoker  . Smokeless tobacco: Never Used  . Alcohol use 0.0 oz/week     Comment: occas.  . Drug use: No  . Sexual activity: Yes    Partners: Male   Other Topics Concern  . None   Social History Narrative  . None    No current facility-administered medications on file prior to encounter.    Current Outpatient Prescriptions on  File Prior to Encounter  Medication Sig Dispense Refill  . ALPRAZolam (XANAX) 0.25 MG tablet TAKE 1 TABLET BY MOUTH EVERY 12 HOURS AS NEEDED FOR ANXIETY 30 tablet 0  . aspirin 81 MG EC tablet Take 81 mg by mouth daily.    Marland Kitchen atorvastatin (LIPITOR) 10 MG tablet TAKE 1 TABLET(10 MG) BY MOUTH AT BEDTIME 90 tablet 1  . Blood Glucose Monitoring Suppl ([place]) KIT 1 kit by Does not apply route daily. Dx: E11.9 1 each 0  . Calcium Carb-Cholecalciferol (CALCIUM-VITAMIN D) 600-400 MG-UNIT TABS Take 2 each by mouth daily.    Marland Kitchen FLUoxetine (PROZAC) 40 MG capsule TAKE 1 CAPSULE(40 MG) BY MOUTH DAILY 90 capsule 0  . fluticasone (FLONASE) 50 MCG/ACT nasal spray Place 2 sprays into both nostrils daily. 16 g 6  . glucose blood (TRUETRACK TEST) test strip Use as instructed daily Dx: E11.9 100 each 12  . Insulin Pen Needle 32G X 4 MM MISC 1 Stick by Does not apply route daily. Dx: E11.9 100 each 3  . metFORMIN (GLUCOPHAGE-XR) 500 MG 24 hr tablet TAKE 2 TABLETS(1000 MG) BY MOUTH TWICE DAILY WITH A MEAL 360 tablet 1  . OVER THE COUNTER MEDICATION Multiple Vitamins-Minerals-Take 1 tablet by  mouth daily.    . ramipril (ALTACE) 2.5 MG capsule TAKE 1 CAPSULE(2.5 MG) BY MOUTH DAILY 90 capsule 0  . VICTOZA 18 MG/3ML SOPN ADMINISTER 1.2 MG UNDER THE SKIN DAILY 6 mL 3    Review of Systems: a complete, 10pt review of systems was completed with pertinent positives and negatives as documented in the HPI  Physical Exam: Vitals:   04/02/17 1500 04/02/17 1516  BP: 119/66 119/66  Pulse: 78 78  Resp:  16  Temp:  98.2 F (36.8 C)  SpO2: 93% 94%   Gen: A&Ox3, no distress  Head: normocephalic, atraumatic, EOMI, anicteric.  Neck: supple without mass or thyromegaly Chest: unlabored respirations, symmetrical air entry   Cardiovascular: RRR with palpable distal pulses, no pedal edema Abdomen: soft, nondistended, nontender. No mass or organomegaly.  Extremities: warm, without edema, no deformities  Neuro:  grossly intact Psych: appropriate mood and affect  Skin: warm and dry   CBC Latest Ref Rng & Units 04/02/2017 09/17/2010 01/08/2009  WBC 4.0 - 10.5 K/uL 13.5(H) 7.6 7.1  Hemoglobin 12.0 - 15.0 g/dL 13.5 14.1 14.2  Hematocrit 36.0 - 46.0 % 39.8 42.0 40.6  Platelets 150 - 400 K/uL 290 253.0 258.0    CMP Latest Ref Rng & Units 04/02/2017 09/24/2016 07/13/2016  Glucose 65 - 99 mg/dL 204(H) 137(H) 148(H)  BUN 6 - 20 mg/dL '18 17 15  '$ Creatinine 0.44 - 1.00 mg/dL 0.77 0.79 0.78  Sodium 135 - 145 mmol/L 138 140 139  Potassium 3.5 - 5.1 mmol/L 4.2 4.1 4.0  Chloride 101 - 111 mmol/L 99(L) 102 103  CO2 22 - 32 mmol/L '28 27 27  '$ Calcium 8.9 - 10.3 mg/dL 10.0 10.7(H) 10.0  Total Protein 6.5 - 8.1 g/dL 7.2 7.3 7.0  Total Bilirubin 0.3 - 1.2 mg/dL 1.3(H) 0.5 0.4  Alkaline Phos 38 - 126 U/L 443(H) 73 77  AST 15 - 41 U/L 696(H) 20 17  ALT 14 - 54 U/L 673(H) 19 16  Lipase 7,620 Albumin 3.9  No results found for: INR, PROTIME  Imaging: Dg Chest 2 View  Result Date: 04/02/2017 CLINICAL DATA:  Chest pain. EXAM: CHEST  2 VIEW COMPARISON:  09/24/2008 FINDINGS: The cardiomediastinal contours are normal. The lungs are clear. Pulmonary vasculature is normal. No consolidation, pleural effusion, or pneumothorax. No acute osseous abnormalities are seen. Right thyroidectomy surgical clips noted at the thoracic inlet. IMPRESSION: No acute pulmonary process. Electronically Signed   By: Jeb Levering M.D.   On: 04/02/2017 06:03   Ct Abdomen Pelvis W Contrast  Result Date: 04/02/2017 CLINICAL DATA:  Epigastric abdominal pain. EXAM: CT ABDOMEN AND PELVIS WITH CONTRAST TECHNIQUE: Multidetector CT imaging of the abdomen and pelvis was performed using the standard protocol following bolus administration of intravenous contrast. CONTRAST:  166m ISOVUE-300 IOPAMIDOL (ISOVUE-300) INJECTION 61% COMPARISON:  None. FINDINGS: Lower chest: The lung bases are clear. Hepatobiliary: Intra and extrahepatic biliary ductal dilatation,  common bile duct dilated to the duodenal insertion and measures 11 mm. The gallbladder is distended with calcifications at the fundus, stones versus wall calcification. Question mild pericholecystic soft tissue stranding. No discrete focal hepatic lesion. Pancreas: Questionable ill-defined low density at the pancreatic head, best appreciated on delayed phase imaging image 24 series 7. Mild proximal pancreatic ductal dilatation measures 4 mm. Mild indistinctness of peripancreatic fat planes. No peripancreatic fluid collection. Spleen: Normal in size without focal abnormality. Adrenals/Urinary Tract: No adrenal nodule. No hydronephrosis or perinephric edema. Homogeneous renal enhancement with symmetric excretion on delayed phase imaging.  Urinary bladder is physiologically distended without wall thickening. Stomach/Bowel: Enteric contrast in the distal esophagus with small hiatal hernia. Stomach is mildly distended. No small bowel obstruction, inflammation or wall thickening. Proximal appendix is normal, distal appendix obscured by adjacent small bowel. Small to moderate colonic stool burden without colonic wall thickening. Minimal sigmoid diverticulosis without acute inflammation. Vascular/Lymphatic: Mild aortic atherosclerosis without aneurysm. Questionable portacaval node measuring 9 mm. No retroperitoneal adenopathy. Reproductive: Uterus and bilateral adnexa are unremarkable. Other: No ascites or free air.  No intra-abdominal abscess. Musculoskeletal: Degenerative change in the lower lumbar spine. There are no acute or suspicious osseous abnormalities. IMPRESSION: 1. Findings suspicious for ill-defined pancreatic head mass. Intra and extrahepatic biliary ductal dilatation and mild proximal pancreatic ductal dilatation. Gallbladder distention with probable wall calcification about the fundus. Recommend pancreatic protocol MRI/MRCP. 2. Probable portacaval node. 3.  Aortic Atherosclerosis (ICD10-I70.0). Electronically  Signed   By: Jeb Levering M.D.   On: 04/02/2017 06:56    A/P: 30OB woman with pancreatitis, possible pancreatic head mass and gallstones vs gallbladder wall calcification  -Medical admission for fluid resuscitation, bowel rest, pain and nausea control for pancreatitis -Needs further evaluation of pancreatic mass with MRI pancreas protocol once pancreatitis has subsided  Will continue to follow. Any plans regarding cholecystectomy will be deferred until the pancreatic mass has been characterized.   Romana Juniper, MD [facility], [place] Pager [phone]

## 2017-04-02 NOTE — ED Notes (Signed)
Pt c/o HA; orders received from EDP.

## 2017-04-02 NOTE — H&P (Signed)
History and Physical    RANDAL YEPIZ JQB:[id number] DOB: Jun 21, 1951 DOA: 04/02/2017  PCP: Brunetta Jeans, PA-C  Patient coming from: Home  Chief Complaint: abd pain  HPI: Tonya Tapia is a 66 y.o. female with medical history significant of diabetes, anxiety who  presented with progressively worsening epigastric discomfort described as "heartburn" that had been noticed for the past several months. Historically, symptoms were believed with Alka-Seltzer. On the evening of admission at approximately 6:00 PM, patient noted marketed epigastric burning sensation that did not improve with Alka-Seltzer. Symptoms started after eating dinner. Patient ultimately presented to [place] or patient was noted have a presenting lipase of over 7000 with elevated liver function tests including an elevated alkaline phosphatase. Patient was found to have evidence of a thickened gallbladder. General surgery was consulted. Hospitalist service was consulted for consideration for admission.  Review of Systems:  Review of Systems  Constitutional: Negative for chills, fever, malaise/fatigue and weight loss.  HENT: Negative for ear discharge, ear pain and nosebleeds.   Eyes: Negative for double vision, photophobia and pain.  Respiratory: Negative for hemoptysis, sputum production and shortness of breath.   Cardiovascular: Negative for palpitations, orthopnea and leg swelling.  Gastrointestinal: Positive for abdominal pain. Negative for diarrhea, nausea and vomiting.  Genitourinary: Negative for frequency and urgency.  Musculoskeletal: Negative for back pain, joint pain and neck pain.  Neurological: Negative for tingling, tremors, focal weakness, seizures, loss of consciousness and headaches.  Psychiatric/Behavioral: Negative for hallucinations, memory loss and substance abuse.    Past Medical History:  Diagnosis Date  . Anxiety   . Diabetes mellitus without complication (HCC)    per pt.  borderline diabetic  . Fibrocystic disease of breast   . Mood swings ([place])     Past Surgical History:  Procedure Laterality Date  . BREAST BIOPSY  1989  . FOOT SURGERY  1991  . PILONIDAL CYST EXCISION  1971  . THYROIDECTOMY Right 2005  . TONSILLECTOMY       reports that she has never smoked. She has never used smokeless tobacco. She reports that she drinks alcohol. She reports that she does not use drugs.  No Known Allergies  Family History  Problem Relation Age of Onset  . Stroke Mother   . Heart attack Father   . Alzheimer's disease Sister        #1-Deceased  . Breast cancer Sister        #1  . Healthy Sister        #2    Prior to Admission medications   Medication Sig Start Date End Date Taking? Authorizing Provider  atorvastatin (LIPITOR) 10 MG tablet TAKE 1 TABLET(10 MG) BY MOUTH AT BEDTIME Patient taking differently: Take 10 mg by mouth every morning. TAKE 1 TABLET(10 MG) BY MOUTH AT BEDTIME 02/23/17  Yes Brunetta Jeans, PA-C  Calcium Carb-Cholecalciferol (CALCIUM-VITAMIN D) 600-400 MG-UNIT TABS Take 2 each by mouth daily.   Yes [provider]  FLUoxetine (PROZAC) 40 MG capsule TAKE 1 CAPSULE(40 MG) BY MOUTH DAILY 03/10/17  Yes Brunetta Jeans, PA-C  fluticasone [facility]) 50 MCG/ACT nasal spray Place 2 sprays into both nostrils daily. 03/16/16  Yes Brunetta Jeans, PA-C  metFORMIN (GLUCOPHAGE-XR) 500 MG 24 hr tablet TAKE 2 TABLETS(1000 MG) BY MOUTH TWICE DAILY WITH A MEAL 01/04/17  Yes Brunetta Jeans, PA-C  OVER THE COUNTER MEDICATION Multiple Vitamins-Minerals-Take 1 tablet by mouth daily.   Yes [provider]  ramipril (ALTACE)  2.5 MG capsule TAKE 1 CAPSULE(2.5 MG) BY MOUTH DAILY 03/10/17  Yes Brunetta Jeans, PA-C  VICTOZA 18 MG/3ML SOPN ADMINISTER 1.2 MG UNDER THE SKIN DAILY 01/08/17  Yes Brunetta Jeans, PA-C  ALPRAZolam Duanne Moron) 0.25 MG tablet TAKE 1 TABLET BY MOUTH EVERY 12 HOURS AS NEEDED FOR ANXIETY Patient not taking: Reported on  04/02/2017 10/20/16   Brunetta Jeans, PA-C  aspirin 81 MG EC tablet Take 81 mg by mouth daily.    [provider]  Blood Glucose Monitoring Suppl ([place]) KIT 1 kit by Does not apply route daily. Dx: E11.9 10/05/16   Brunetta Jeans, PA-C  glucose blood (TRUETRACK TEST) test strip Use as instructed daily Dx: E11.9 10/05/16   Brunetta Jeans, PA-C  Insulin Pen Needle 32G X 4 MM MISC 1 Stick by Does not apply route daily. Dx: E11.9 11/03/16   Brunetta Jeans, PA-C    Physical Exam: Vitals:   04/02/17 1400 04/02/17 1430 04/02/17 1500 04/02/17 1516  BP: 101/62 117/60 119/66 119/66  Pulse: 76 80 78 78  Resp: 16   16  Temp:    98.2 F (36.8 C)  TempSrc:      SpO2: 93% 92% 93% 94%    Constitutional: NAD, calm, comfortable Vitals:   04/02/17 1400 04/02/17 1430 04/02/17 1500 04/02/17 1516  BP: 101/62 117/60 119/66 119/66  Pulse: 76 80 78 78  Resp: 16   16  Temp:    98.2 F (36.8 C)  TempSrc:      SpO2: 93% 92% 93% 94%   Eyes: PERRL, lids and conjunctivae normal ENMT: Mucous membranes are moist. Posterior pharynx clear of any exudate or lesions.Normal dentition.  Neck: normal, supple, no masses, no thyromegaly Respiratory: clear to auscultation bilaterally, no wheezing, no crackles. Normal respiratory effort. No accessory muscle use.  Cardiovascular: Regular rate and rhythm, no murmurs / rubs / gallops. No extremity edema. 2+ pedal pulses. No carotid bruits.  Abdomen: Soft, decreased bowel sounds, nontender, nondistended Musculoskeletal: no clubbing / cyanosis. No joint deformity upper and lower extremities. Good ROM, no contractures. Normal muscle tone.  Skin: no rashes, lesions, ulcers. No induration Neurologic: CN 2-12 grossly intact. Sensation intact, DTR normal. Strength 5/5 in all 4.  Psychiatric: Normal judgment and insight. Alert and oriented x 3. Normal mood.    Labs on Admission: I have personally reviewed following labs and imaging  studies  CBC:  Recent Labs Lab 04/02/17 0450  WBC 13.5*  NEUTROABS 9.0*  HGB 13.5  HCT 39.8  MCV 88.4  PLT 973   Basic Metabolic Panel:  Recent Labs Lab 04/02/17 0450  NA 138  K 4.2  CL 99*  CO2 28  GLUCOSE 204*  BUN 18  CREATININE 0.77  CALCIUM 10.0   GFR: CrCl cannot be calculated (Unknown ideal weight.). Liver Function Tests:  Recent Labs Lab 04/02/17 0450  AST 696*  ALT 673*  ALKPHOS 443*  BILITOT 1.3*  PROT 7.2  ALBUMIN 3.9    Recent Labs Lab 04/02/17 0450  LIPASE 7,620*   No results for input(s): AMMONIA in the last 168 hours. Coagulation Profile: No results for input(s): INR, PROTIME in the last 168 hours. Cardiac Enzymes:  Recent Labs Lab 04/02/17 0450  TROPONINI <0.03   BNP (last 3 results) No results for input(s): PROBNP in the last 8760 hours. HbA1C: No results for input(s): HGBA1C in the last 72 hours. CBG:  Recent Labs Lab 04/02/17 0747 04/02/17 1309  GLUCAP 174* 152*  Lipid Profile: No results for input(s): CHOL, HDL, LDLCALC, TRIG, CHOLHDL, LDLDIRECT in the last 72 hours. Thyroid Function Tests: No results for input(s): TSH, T4TOTAL, FREET4, T3FREE, THYROIDAB in the last 72 hours. Anemia Panel: No results for input(s): VITAMINB12, FOLATE, FERRITIN, TIBC, IRON, RETICCTPCT in the last 72 hours. Urine analysis:    Component Value Date/Time   COLORURINE YELLOW 12/10/2015 [place] 12/10/2015 0934   LABSPEC 1.025 12/10/2015 0934   PHURINE 5.5 12/10/2015 0934   GLUCOSEU NEGATIVE 12/10/2015 0934   HGBUR NEGATIVE 12/10/2015 0934   BILIRUBINUR NEGATIVE 12/10/2015 0934   KETONESUR NEGATIVE 12/10/2015 0934   UROBILINOGEN 0.2 12/10/2015 0934   NITRITE NEGATIVE 12/10/2015 0934   LEUKOCYTESUR NEGATIVE 12/10/2015 0934   Sepsis Labs: !!!!!!!!!!!!!!!!!!!!!!!!!!!!!!!!!!!!!!!!!!!! '@LABRCNTIP'$ (procalcitonin:4,lacticidven:4) )No results found for this or any previous visit (from the past 240 hour(s)).    Radiological Exams on Admission: Dg Chest 2 View  Result Date: 04/02/2017 CLINICAL DATA:  Chest pain. EXAM: CHEST  2 VIEW COMPARISON:  09/24/2008 FINDINGS: The cardiomediastinal contours are normal. The lungs are clear. Pulmonary vasculature is normal. No consolidation, pleural effusion, or pneumothorax. No acute osseous abnormalities are seen. Right thyroidectomy surgical clips noted at the thoracic inlet. IMPRESSION: No acute pulmonary process. Electronically Signed   By: Jeb Levering M.D.   On: 04/02/2017 06:03   Ct Abdomen Pelvis W Contrast  Result Date: 04/02/2017 CLINICAL DATA:  Epigastric abdominal pain. EXAM: CT ABDOMEN AND PELVIS WITH CONTRAST TECHNIQUE: Multidetector CT imaging of the abdomen and pelvis was performed using the standard protocol following bolus administration of intravenous contrast. CONTRAST:  135m ISOVUE-300 IOPAMIDOL (ISOVUE-300) INJECTION 61% COMPARISON:  None. FINDINGS: Lower chest: The lung bases are clear. Hepatobiliary: Intra and extrahepatic biliary ductal dilatation, common bile duct dilated to the duodenal insertion and measures 11 mm. The gallbladder is distended with calcifications at the fundus, stones versus wall calcification. Question mild pericholecystic soft tissue stranding. No discrete focal hepatic lesion. Pancreas: Questionable ill-defined low density at the pancreatic head, best appreciated on delayed phase imaging image 24 series 7. Mild proximal pancreatic ductal dilatation measures 4 mm. Mild indistinctness of peripancreatic fat planes. No peripancreatic fluid collection. Spleen: Normal in size without focal abnormality. Adrenals/Urinary Tract: No adrenal nodule. No hydronephrosis or perinephric edema. Homogeneous renal enhancement with symmetric excretion on delayed phase imaging. Urinary bladder is physiologically distended without wall thickening. Stomach/Bowel: Enteric contrast in the distal esophagus with small hiatal hernia. Stomach is mildly  distended. No small bowel obstruction, inflammation or wall thickening. Proximal appendix is normal, distal appendix obscured by adjacent small bowel. Small to moderate colonic stool burden without colonic wall thickening. Minimal sigmoid diverticulosis without acute inflammation. Vascular/Lymphatic: Mild aortic atherosclerosis without aneurysm. Questionable portacaval node measuring 9 mm. No retroperitoneal adenopathy. Reproductive: Uterus and bilateral adnexa are unremarkable. Other: No ascites or free air.  No intra-abdominal abscess. Musculoskeletal: Degenerative change in the lower lumbar spine. There are no acute or suspicious osseous abnormalities. IMPRESSION: 1. Findings suspicious for ill-defined pancreatic head mass. Intra and extrahepatic biliary ductal dilatation and mild proximal pancreatic ductal dilatation. Gallbladder distention with probable wall calcification about the fundus. Recommend pancreatic protocol MRI/MRCP. 2. Probable portacaval node. 3.  Aortic Atherosclerosis (ICD10-I70.0). Electronically Signed   By: MJeb LeveringM.D.   On: 04/02/2017 06:56    EKG: Independently reviewed. sinus  Assessment/Plan Principal Problem:   Pancreatitis Active Problems:   History of partial thyroidectomy   Type 2 diabetes mellitus without complication, without long-term current use of insulin ([place]  Abnormal LFTs   1. Pancreatitis 1. Pt presents with lipase of over 7,000 with elevated LFTs including Alk Phos of 443 2. Continue NPO, IVF, bowel rest 3. Surgery consulted per below 4. Repeat LFTs and Lipase in AM  5. CT reviewed. Findings of ill-defined pancreatic head mass with intra/extra hepatic biliary ductal dilitation and prox pancreatic ductal dilitation. 2. DM2 1. Glucose stable at present 2. Will continue SSI coverage 3. Abd pain 1. Likely secondary to above pancreatitis 4. Elevated LFT's with pancreatic head mass 1. Pt seen by General Surgery.  2. Recommendations for  continued supportive care and MRI pancreas protocol when pancreatitis subsided 3. Consideration for cholecystectomy when pancreatic mass is better characterized  DVT prophylaxis: Lovenox subq  Code Status: Full Family Communication: Pt in room,  Disposition Plan: Uncertain at this time  Consults called: General Surgery Admission status: Inpatient status as will likely require greater than 2 midnight stay to resolve pancreatitis and work up pancreatic mass   CHIU, Orpah Melter MD [facility] Pager [phone]  If 7PM-7AM, please contact night-coverage [email or web address] Password [facility]  04/02/2017, 5:36 PM

## 2017-04-02 NOTE — ED Provider Notes (Signed)
[facility] DEPT [facility] Provider Note   CSN: [id number] Arrival date & time: 04/02/17  0431     History   Chief Complaint Chief Complaint  Patient presents with  . Chest Pain    HPI Tonya Tapia is a 66 y.o. female.  The history is provided by the patient.  Abdominal Pain   This is a new problem. The current episode started more than 1 week ago. Episode frequency: every other day for 2 weeks.  Has had this many times before but usually alka seltzer takes it away.  It has not helped this time.   The problem has not changed since onset.The pain is associated with eating. The pain is located in the RUQ and epigastric region. The quality of the pain is sharp. The pain is severe. Pertinent negatives include anorexia, fever, belching, diarrhea, flatus, hematochezia, melena, nausea, vomiting, constipation, dysuria, frequency, hematuria, headaches, arthralgias and myalgias. Nothing aggravates the symptoms. Nothing relieves the symptoms. Past workup does not include surgery. Her past medical history does not include ulcerative colitis or irritable bowel syndrome.  Ate creamed soup for lunch and chicken salad on crackers with apple sauce for dinner.  Drinks one glass or wine at day but not since Monday.    Past Medical History:  Diagnosis Date  . Anxiety   . Diabetes mellitus without complication (HCC)    per pt. borderline diabetic  . Fibrocystic disease of breast   . Mood swings [facility])     Patient Active Problem List   Diagnosis Date Noted  . Osteopenia 07/16/2016  . Vitamin D deficiency 07/16/2016  . Postmenopausal estrogen deficiency 06/15/2016  . Type 2 diabetes mellitus without complication, without long-term current use of insulin ([place]) 01/29/2015  . Anxiety and depression 01/29/2015  . Breast cancer screening 01/29/2015  . RESTLESS LEG SYNDROME 07/04/2007  . MIGRAINE, MENSTRUAL 07/04/2007  . PSORIASIS 07/04/2007  . History of partial thyroidectomy 07/04/2007    Past  Surgical History:  Procedure Laterality Date  . BREAST BIOPSY  1989  . FOOT SURGERY  1991  . PILONIDAL CYST EXCISION  1971  . THYROIDECTOMY Right 2005  . TONSILLECTOMY      OB History    No data available       Home Medications    Prior to Admission medications   Medication Sig Start Date End Date Taking? Authorizing Provider  ALPRAZolam Duanne Moron) 0.25 MG tablet TAKE 1 TABLET BY MOUTH EVERY 12 HOURS AS NEEDED FOR ANXIETY 10/20/16   Brunetta Jeans, PA-C  aspirin 81 MG EC tablet Take 81 mg by mouth daily.    [provider]  atorvastatin (LIPITOR) 10 MG tablet TAKE 1 TABLET(10 MG) BY MOUTH AT BEDTIME 02/23/17   Brunetta Jeans, PA-C  Blood Glucose Monitoring Suppl ([place]) KIT 1 kit by Does not apply route daily. Dx: E11.9 10/05/16   Brunetta Jeans, PA-C  Calcium Carb-Cholecalciferol (CALCIUM-VITAMIN D) 600-400 MG-UNIT TABS Take 2 each by mouth daily.    [provider]  FLUoxetine (PROZAC) 40 MG capsule TAKE 1 CAPSULE(40 MG) BY MOUTH DAILY 03/10/17   Brunetta Jeans, PA-C  fluticasone [facility]) 50 MCG/ACT nasal spray Place 2 sprays into both nostrils daily. 03/16/16   Brunetta Jeans, PA-C  glucose blood (TRUETRACK TEST) test strip Use as instructed daily Dx: E11.9 10/05/16   Brunetta Jeans, PA-C  Insulin Pen Needle 32G X 4 MM MISC 1 Stick by Does not apply route daily. Dx: E11.9 11/03/16  Waldon Merl, PA-C  metFORMIN (GLUCOPHAGE-XR) 500 MG 24 hr tablet TAKE 2 TABLETS(1000 MG) BY MOUTH TWICE DAILY WITH A MEAL 01/04/17   Waldon Merl, PA-C  OVER THE COUNTER MEDICATION Multiple Vitamins-Minerals-Take 1 tablet by mouth daily.    [provider]  ramipril (ALTACE) 2.5 MG capsule TAKE 1 CAPSULE(2.5 MG) BY MOUTH DAILY 03/10/17   Waldon Merl, PA-C  VICTOZA 18 MG/3ML SOPN ADMINISTER 1.2 MG UNDER THE SKIN DAILY 01/08/17   Waldon Merl, PA-C    Family History Family History  Problem Relation Age of Onset  . Stroke Mother   .  Heart attack Father   . Alzheimer's disease Sister        #1-Deceased  . Breast cancer Sister        #1  . Healthy Sister        #2    Social History Social History  Substance Use Topics  . Smoking status: Never Smoker  . Smokeless tobacco: Never Used  . Alcohol use 0.0 oz/week     Comment: occas.     Allergies   Patient has no known allergies.   Review of Systems Review of Systems  Constitutional: Negative for fever.  Gastrointestinal: Positive for abdominal pain. Negative for anorexia, constipation, diarrhea, flatus, hematochezia, melena, nausea and vomiting.  Genitourinary: Negative for dysuria, frequency and hematuria.  Musculoskeletal: Negative for arthralgias and myalgias.  Neurological: Negative for headaches.  All other systems reviewed and are negative.    Physical Exam Updated Vital Signs BP (!) 144/78   Pulse 88   Temp 97.8 F (36.6 C) (Oral)   Resp 15   SpO2 99%   Physical Exam  Constitutional: She is oriented to person, place, and time. She appears well-developed and well-nourished. No distress.  HENT:  Head: Normocephalic and atraumatic.  Mouth/Throat: No oropharyngeal exudate.  Eyes: Pupils are equal, round, and reactive to light. Conjunctivae are normal.  Neck: Normal range of motion. Neck supple.  Cardiovascular: Normal rate, regular rhythm, normal heart sounds and intact distal pulses.   Pulmonary/Chest: Effort normal and breath sounds normal. She has no wheezes.  Abdominal: Soft. She exhibits no distension and no mass. Bowel sounds are increased. There is tenderness. There is positive Murphy's sign. There is no rigidity and no tenderness at McBurney's point.    Neurological: She is alert and oriented to person, place, and time.  Skin: Skin is warm and dry. Capillary refill takes less than 2 seconds.  Psychiatric: She has a normal mood and affect.     ED Treatments / Results  Labs (all labs ordered are listed, but only abnormal results  are displayed)  Results for orders placed or performed during the hospital encounter of 04/02/17  CBC with Differential  Result Value Ref Range   WBC 13.5 (H) 4.0 - 10.5 K/uL   RBC 4.50 3.87 - 5.11 MIL/uL   Hemoglobin 13.5 12.0 - 15.0 g/dL   HCT 11.6 57.9 - 03.8 %   MCV 88.4 78.0 - 100.0 fL   MCH 30.0 26.0 - 34.0 pg   MCHC 33.9 30.0 - 36.0 g/dL   RDW 33.3 83.2 - 91.9 %   Platelets 290 150 - 400 K/uL   Neutrophils Relative % 66 %   Neutro Abs 9.0 (H) 1.7 - 7.7 K/uL   Lymphocytes Relative 19 %   Lymphs Abs 2.6 0.7 - 4.0 K/uL   Monocytes Relative 10 %   Monocytes Absolute 1.3 (H) 0.1 - 1.0 K/uL  Eosinophils Relative 4 %   Eosinophils Absolute 0.5 0.0 - 0.7 K/uL   Basophils Relative 1 %   Basophils Absolute 0.1 0.0 - 0.1 K/uL  Comprehensive metabolic panel  Result Value Ref Range   Sodium 138 135 - 145 mmol/L   Potassium 4.2 3.5 - 5.1 mmol/L   Chloride 99 (L) 101 - 111 mmol/L   CO2 28 22 - 32 mmol/L   Glucose, Bld 204 (H) 65 - 99 mg/dL   BUN 18 6 - 20 mg/dL   Creatinine, Ser 0.77 0.44 - 1.00 mg/dL   Calcium 10.0 8.9 - 10.3 mg/dL   Total Protein 7.2 6.5 - 8.1 g/dL   Albumin 3.9 3.5 - 5.0 g/dL   AST 696 (H) 15 - 41 U/L   ALT 673 (H) 14 - 54 U/L   Alkaline Phosphatase 443 (H) 38 - 126 U/L   Total Bilirubin 1.3 (H) 0.3 - 1.2 mg/dL   GFR calc non Af Amer >60 >60 mL/min   GFR calc Af Amer >60 >60 mL/min   Anion gap 11 5 - 15  Troponin I  Result Value Ref Range   Troponin I <0.03 <0.03 ng/mL   Dg Chest 2 View  Result Date: 04/02/2017 CLINICAL DATA:  Chest pain. EXAM: CHEST  2 VIEW COMPARISON:  09/24/2008 FINDINGS: The cardiomediastinal contours are normal. The lungs are clear. Pulmonary vasculature is normal. No consolidation, pleural effusion, or pneumothorax. No acute osseous abnormalities are seen. Right thyroidectomy surgical clips noted at the thoracic inlet. IMPRESSION: No acute pulmonary process. Electronically Signed   By: Jeb Levering M.D.   On: 04/02/2017 06:03     EKG  EKG Interpretation  Date/Time:  Friday April 02 2017 04:39:22 EDT Ventricular Rate:  88 PR Interval:    QRS Duration: 76 QT Interval:  352 QTC Calculation: 426 R Axis:   64 Text Interpretation:  Sinus rhythm Confirmed by Randal Buba, Juris Gosnell ([id number]) on 04/02/2017 4:47:06 AM       Radiology No results found.  Procedures Procedures (including critical care time)  Medications Ordered in ED Medications  0.9 %  sodium chloride infusion (1,000 mLs Intravenous New Bag/Given 04/02/17 0542)  vancomycin (VANCOCIN) IVPB 1000 mg/200 mL premix (not administered)  piperacillin-tazobactam (ZOSYN) IVPB 3.375 g (not administered)  dicyclomine (BENTYL) injection 20 mg (20 mg Intramuscular Given 04/02/17 0504)  gi cocktail (Maalox,Lidocaine,Donnatal) (30 mLs Oral Given 04/02/17 0503)  ondansetron (ZOFRAN) injection 4 mg (4 mg Intravenous Given 04/02/17 0510)  fentaNYL (SUBLIMAZE) injection 50 mcg (50 mcg Intravenous Given 04/02/17 0549)  iopamidol (ISOVUE-300) 61 % injection 100 mL (100 mLs Intravenous Contrast Given 04/02/17 0558)     Case d/w Dr. Lucia Gaskins please admit to medicine   Case d/w Dr. Blaine Hamper who will accept the patient  Final Clinical Impressions(s) / ED Diagnoses  Pancreatitis secondary to biliary disease.  Admit to medicine with surgery consult.   New Prescriptions New Prescriptions   No medications on file     Jameis Newsham, MD 04/02/17 6599

## 2017-04-02 NOTE — Progress Notes (Signed)
This is a no charge note  Transfer from [facility] per Dr. Randal Buba  66 year old lady with past medical history for diabetes, hyperlipidemia, depression, anxiety, who presents with nausea, vomiting and epigastric abdominal pain for almost 2 weeks. Patient also has right upper quadrant tenderness. Patient was found to have lipase 7620, abnormal liver functions with AST 696, ALT 673, total bilirubin 1.3 and ALP 443. Electrolytes renal function okay. WBC 13.5, negative troponin, no fever, no tachycardia, O2 sat 99% on room air. Chest x-ray negative. CT scan of abdomen/pelvis is pending, but per EDP, it showed thick gallbladder wall. Patient is admitted to [place] bed as inpatient. Gen. surgeon, Dr. Lucia Gaskins was consulted by EDP. One dose of vancomycin and Zosyn were given in ED.   Please call manager of [facility] at [phone] when pt arrives to floor   Ivor Costa, MD  [facility] Pager [phone]  If 7PM-7AM, please contact night-coverage [email or web address] Password [facility] 04/02/2017, 6:43 AM

## 2017-04-03 ENCOUNTER — Inpatient Hospital Stay (HOSPITAL_COMMUNITY): Payer: Medicare Other

## 2017-04-03 DIAGNOSIS — K869 Disease of pancreas, unspecified: Secondary | ICD-10-CM

## 2017-04-03 DIAGNOSIS — K8689 Other specified diseases of pancreas: Secondary | ICD-10-CM | POA: Insufficient documentation

## 2017-04-03 DIAGNOSIS — K858 Other acute pancreatitis without necrosis or infection: Secondary | ICD-10-CM

## 2017-04-03 DIAGNOSIS — R945 Abnormal results of liver function studies: Secondary | ICD-10-CM

## 2017-04-03 DIAGNOSIS — K8500 Idiopathic acute pancreatitis without necrosis or infection: Secondary | ICD-10-CM

## 2017-04-03 LAB — COMPREHENSIVE METABOLIC PANEL
ALT: 596 U/L — ABNORMAL HIGH (ref 14–54)
AST: 350 U/L — ABNORMAL HIGH (ref 15–41)
Albumin: 3.4 g/dL — ABNORMAL LOW (ref 3.5–5.0)
Alkaline Phosphatase: 412 U/L — ABNORMAL HIGH (ref 38–126)
Anion gap: 10 (ref 5–15)
BUN: 11 mg/dL (ref 6–20)
CO2: 24 mmol/L (ref 22–32)
Calcium: 8.9 mg/dL (ref 8.9–10.3)
Chloride: 107 mmol/L (ref 101–111)
Creatinine, Ser: 0.59 mg/dL (ref 0.44–1.00)
GFR calc Af Amer: >60 mL/min (ref >60)
GFR calc non Af Amer: >60 mL/min (ref >60)
Glucose, Bld: 105 mg/dL — ABNORMAL HIGH (ref 65–99)
Potassium: 4.2 mmol/L (ref 3.5–5.1)
Sodium: 141 mmol/L (ref 135–145)
Total Bilirubin: 1.9 mg/dL — ABNORMAL HIGH (ref 0.3–1.2)
Total Protein: 6.5 g/dL (ref 6.5–8.1)

## 2017-04-03 LAB — GLUCOSE, CAPILLARY
GLUCOSE-CAPILLARY: 113 mg/dL — AB (ref 65–99)
GLUCOSE-CAPILLARY: 138 mg/dL — AB (ref 65–99)
Glucose-Capillary: 95 mg/dL (ref 65–99)
Glucose-Capillary: 116 mg/dL — ABNORMAL HIGH (ref 65–99)
Glucose-Capillary: 113 mg/dL — ABNORMAL HIGH (ref 65–99)
Glucose-Capillary: 189 mg/dL — ABNORMAL HIGH (ref 65–99)

## 2017-04-03 LAB — LIPASE, BLOOD: LIPASE: 145 U/L — AB (ref 11–51)

## 2017-04-03 MED ORDER — GADOBENATE DIMEGLUMINE 529 MG/ML IV SOLN
15.0000 mL | Freq: Once | INTRAVENOUS | Status: AC | PRN
  Administered 2017-04-03: 13 mL via INTRAVENOUS

## 2017-04-03 MED ORDER — FLUTICASONE PROPIONATE 50 MCG/ACT NA SUSP
2.0000 | Freq: Every day | NASAL | Status: DC
  Administered 2017-04-08: 2 via NASAL
  Filled 2017-04-03: qty 16

## 2017-04-03 MED ORDER — INSULIN ASPART 100 UNIT/ML ~~LOC~~ SOLN
0.0000 [IU] | Freq: Every day | SUBCUTANEOUS | Status: DC
  Administered 2017-04-04: 2 [IU] via SUBCUTANEOUS
  Administered 2017-04-05: 3 [IU] via SUBCUTANEOUS
  Administered 2017-04-06: 2 [IU] via SUBCUTANEOUS

## 2017-04-03 MED ORDER — INSULIN ASPART 100 UNIT/ML ~~LOC~~ SOLN
0.0000 [IU] | Freq: Three times a day (TID) | SUBCUTANEOUS | Status: DC
  Administered 2017-04-04: 2 [IU] via SUBCUTANEOUS
  Administered 2017-04-04: 3 [IU] via SUBCUTANEOUS
  Administered 2017-04-04: 2 [IU] via SUBCUTANEOUS
  Administered 2017-04-05: 1 [IU] via SUBCUTANEOUS
  Administered 2017-04-05: 3 [IU] via SUBCUTANEOUS
  Administered 2017-04-05 – 2017-04-06 (×2): 5 [IU] via SUBCUTANEOUS
  Administered 2017-04-06: 2 [IU] via SUBCUTANEOUS
  Administered 2017-04-07: 3 [IU] via SUBCUTANEOUS
  Administered 2017-04-07: 2 [IU] via SUBCUTANEOUS
  Administered 2017-04-07: 5 [IU] via SUBCUTANEOUS
  Administered 2017-04-08: 1 [IU] via SUBCUTANEOUS
  Administered 2017-04-08: 2 [IU] via SUBCUTANEOUS
  Administered 2017-04-08 – 2017-04-09 (×2): 3 [IU] via SUBCUTANEOUS

## 2017-04-03 MED ORDER — FLUOXETINE HCL 20 MG PO CAPS
40.0000 mg | ORAL_CAPSULE | Freq: Every day | ORAL | Status: DC
  Administered 2017-04-03 – 2017-04-09 (×6): 40 mg via ORAL
  Filled 2017-04-03 (×6): qty 2

## 2017-04-03 NOTE — Progress Notes (Signed)
[facility] Progress Note  Patient: Tonya Tapia OFB:[id number]   PCP: Brunetta Jeans, PA-C DOB: Oct 16, 1950   DOA: 04/02/2017   DOS: 04/03/2017   Date of Service: the patient was seen and examined on 04/03/2017  Subjective: Denies any acute complaint of abdominal pain, nausea, vomiting. No fever no chills. Feeling better.  Brief hospital course: Pt. with type II DM, anxiety, restless leg syndrome; admitted on 04/02/2017, presented with complaint of abdominal pain, was found to have acute pancreatitis as well as pancreatic mass. GI consulted. Currently further plan is further workup of the pancreatic mass.  Assessment and Plan: 1. Acute pancreatitis. Presents with complaints of abdominal pain lipase was elevated to 7000. Patient was kept nothing by mouth given IV fluids. When necessary nausea and pain medication. CT abdomen was performed which showed a progressive ability of a pancreatic mass. This morning patient's lipase is almost normalized pain almost gone. Denies any nausea or vomiting and patient is anxious today and answered diet. Currently on clear liquid diet and will monitor.  2. Pancreatic mass. Suspicious for carcinoma. MRCP ordered by GI. Follow-up on the results. CEA and CA-19-9 he also ordered.  3. Type 2 diabetes mellitus. We will change sliding scale from every 4 to a CHS. Monitor. Continue sensitive sliding scale.  4. Elevated LFT. Likely in the setting of pancreatic head mass. MRCP ordered for any bile duct abnormality.  Diet: Clear liquid diet DVT Prophylaxis: subcutaneous Heparin  Advance goals of care discussion: full code  Family Communication: family was present at bedside, at the time of interview. The pt provided permission to discuss medical plan with the family. Opportunity was given to ask question and all questions were answered satisfactorily.   Disposition:  Discharge to home.  Consultants: gastroenterology  Procedures:  none  Antibiotics: Anti-infectives    Start     Dose/Rate Route Frequency Ordered Stop   04/02/17 0545  vancomycin (VANCOCIN) IVPB 1000 mg/200 mL premix     1,000 mg 200 mL/hr over 60 Minutes Intravenous  Once 04/02/17 0536 04/02/17 0858   04/02/17 0545  piperacillin-tazobactam (ZOSYN) IVPB 3.375 g     3.375 g 100 mL/hr over 30 Minutes Intravenous  Once 04/02/17 0536 04/02/17 0714       Objective: Physical Exam: Vitals:   04/02/17 2035 04/03/17 0412 04/03/17 0500 04/03/17 1402  BP: (!) 114/98 133/76  131/67  Pulse: 92 87  76  Resp: 20 16  16   Temp: 98.6 F (37 C) 97.8 F (36.6 C)  97.8 F (36.6 C)  TempSrc: Oral Oral  Oral  SpO2: 96% 98%  97%  Weight:   66.1 kg (145 lb 11.6 oz)   Height:        Intake/Output Summary (Last 24 hours) at 04/03/17 1700 Last data filed at 04/03/17 1130  Gross per 24 hour  Intake          1850.83 ml  Output                6 ml  Net          1844.83 ml   Filed Weights   04/02/17 1736 04/03/17 0500  Weight: 66.3 kg (146 lb 2.6 oz) 66.1 kg (145 lb 11.6 oz)   General: Alert, Awake and Oriented to Time, Place and Person. Appear in mild distress, affect appropriate Eyes: PERRL, Conjunctiva normal ENT: Oral Mucosa clear moist. Neck: no JVD, no Abnormal Mass Or lumps Cardiovascular: S1 and S2 Present, no Murmur, Peripheral Pulses  Present Respiratory: normal respiratory effort, Bilateral Air entry equal and Decreased, no use of accessory muscle, Clear to Auscultation, no Crackles, no wheezes Abdomen: Bowel Sound present, Soft and no tenderness, no hernia Skin: no redness, no Rash, no induration Extremities: no Pedal edema, no calf tenderness Neurologic: Grossly no focal neuro deficit. Bilaterally Equal motor strength  Data Reviewed: CBC:  Recent Labs Lab 04/02/17 0450 04/02/17 1841  WBC 13.5* 12.7*  NEUTROABS 9.0*  --   HGB 13.5 12.8  HCT 39.8 37.6  MCV 88.4 88.1  PLT 290 629   Basic Metabolic Panel:  Recent Labs Lab  04/02/17 0450 04/02/17 1841 04/03/17 0349  NA 138  --  141  K 4.2  --  4.2  CL 99*  --  107  CO2 28  --  24  GLUCOSE 204*  --  105*  BUN 18  --  11  CREATININE 0.77 0.71 0.59  CALCIUM 10.0  --  8.9    Liver Function Tests:  Recent Labs Lab 04/02/17 0450 04/03/17 0349  AST 696* 350*  ALT 673* 596*  ALKPHOS 443* 412*  BILITOT 1.3* 1.9*  PROT 7.2 6.5  ALBUMIN 3.9 3.4*    Recent Labs Lab 04/02/17 0450 04/03/17 0349  LIPASE 7,620* 145*   No results for input(s): AMMONIA in the last 168 hours. Coagulation Profile: No results for input(s): INR, PROTIME in the last 168 hours. Cardiac Enzymes:  Recent Labs Lab 04/02/17 0450  TROPONINI <0.03   BNP (last 3 results) No results for input(s): PROBNP in the last 8760 hours. CBG:  Recent Labs Lab 04/02/17 2037 04/03/17 0008 04/03/17 0406 04/03/17 0754 04/03/17 1139  GLUCAP 113* 113* 95 116* 138*   Studies: No results found.  Scheduled Meds: . enoxaparin (LOVENOX) injection  40 mg Subcutaneous Q24H  . FLUoxetine  40 mg Oral Daily  . fluticasone  2 spray Each Nare Daily  . insulin aspart  0-5 Units Subcutaneous QHS  . insulin aspart  0-9 Units Subcutaneous TID WC   Continuous Infusions: . sodium chloride 150 mL/hr at 04/03/17 0014   PRN Meds: gadobenate dimeglumine, hydrALAZINE, morphine injection, ondansetron (ZOFRAN) IV  Time spent: 35 minutes  Author: Berle Mull, MD [facility] Pager: [phone] 04/03/2017 5:00 PM  If 7PM-7AM, please contact night-coverage at [email or web address], password [facility]

## 2017-04-03 NOTE — Consult Note (Signed)
Consultation  Referring Provider:  [facility]Patel MD Primary Care Physician:  Brunetta Jeans, PA-C Primary Gastroenterologist:  Dr.Gessner  Reason for Consultation:   Pancreatitis ,abnormal CT  HPI: Tonya Tapia is a 66 y.o. female  , generally in good health, who was admitted through the emergency room yesterday with acute epigastric/subxiphoid pain. Patient says that she has been having intermittent episodes of subxiphoid discomfort over the past couple of weeks which felt like heartburn to her. She would take ALKA-SELTZER with good relief. Appetite has been fine, she has not had any nausea vomiting or fever. And no abdominal pain in between these episodes. On Thursday evening 04/01/2017 she had an episode which persisted and did not resolve with Alka-Seltzer. She describes the pain as 9 out of 10 because it was unrelenting. She did have 1 episode of vomiting in the emergency room. She says pain resolved after she received pain medication in the emergency room and has not recurred. She feels fine currently and has absolutely no abdominal discomfort and is hungry. Patient known to Dr. Carlean Purl from screening colonoscopy done in 2010 which was normal. Other medical problems include anxiety restless leg syndrome and adult-onset diabetes mellitus.  Workup in the emergency room yesterday showed WBC of 13.5, hemoglobin 13.5, T bili 1.3, alkaline phosphatase 443, AST 696 and ALT of 673, lipase was greater than 7000. Today's labs showed T bili of 1.9, alkaline phosphatase 411, AST of 350 and ALT of 596, lipase down to 145. CT of the abdomen and pelvis shows intra-and extrahepatic biliary ductal dilation with CBD of 11 mm, the gallbladder is distended and there are some calcifications at the fundus and question of mild pericholecystic soft tissue stranding. There is also question of an ill-defined density at the pancreatic head and mild proximal pancreatic ductal dilation to 4 mm. There is  some indistinctness of the peripancreatic fat planes.   Past Medical History:  Diagnosis Date  . Anxiety   . Diabetes mellitus without complication (HCC)    per pt. borderline diabetic  . Fibrocystic disease of breast   . Mood swings ([place])     Past Surgical History:  Procedure Laterality Date  . BREAST BIOPSY  1989  . FOOT SURGERY  1991  . PILONIDAL CYST EXCISION  1971  . THYROIDECTOMY Right 2005  . TONSILLECTOMY      Prior to Admission medications   Medication Sig Start Date End Date Taking? Authorizing Provider  atorvastatin (LIPITOR) 10 MG tablet TAKE 1 TABLET(10 MG) BY MOUTH AT BEDTIME Patient taking differently: Take 10 mg by mouth every morning. TAKE 1 TABLET(10 MG) BY MOUTH AT BEDTIME 02/23/17  Yes Brunetta Jeans, PA-C  Calcium Carb-Cholecalciferol (CALCIUM-VITAMIN D) 600-400 MG-UNIT TABS Take 2 each by mouth daily.   Yes [provider]  FLUoxetine (PROZAC) 40 MG capsule TAKE 1 CAPSULE(40 MG) BY MOUTH DAILY 03/10/17  Yes Brunetta Jeans, PA-C  fluticasone [facility]) 50 MCG/ACT nasal spray Place 2 sprays into both nostrils daily. 03/16/16  Yes Brunetta Jeans, PA-C  metFORMIN (GLUCOPHAGE-XR) 500 MG 24 hr tablet TAKE 2 TABLETS(1000 MG) BY MOUTH TWICE DAILY WITH A MEAL 01/04/17  Yes Brunetta Jeans, PA-C  OVER THE COUNTER MEDICATION Multiple Vitamins-Minerals-Take 1 tablet by mouth daily.   Yes [provider]  ramipril (ALTACE) 2.5 MG capsule TAKE 1 CAPSULE(2.5 MG) BY MOUTH DAILY 03/10/17  Yes Brunetta Jeans, PA-C  VICTOZA 18 MG/3ML SOPN ADMINISTER 1.2 MG UNDER THE SKIN DAILY 01/08/17  Yes  Brunetta Jeans, PA-C  ALPRAZolam Duanne Moron) 0.25 MG tablet TAKE 1 TABLET BY MOUTH EVERY 12 HOURS AS NEEDED FOR ANXIETY Patient not taking: Reported on 04/02/2017 10/20/16   Brunetta Jeans, PA-C  aspirin 81 MG EC tablet Take 81 mg by mouth daily.    [provider]  Blood Glucose Monitoring Suppl ([place]) KIT 1 kit by Does not apply route daily.  Dx: E11.9 10/05/16   Brunetta Jeans, PA-C  glucose blood (TRUETRACK TEST) test strip Use as instructed daily Dx: E11.9 10/05/16   Brunetta Jeans, PA-C  Insulin Pen Needle 32G X 4 MM MISC 1 Stick by Does not apply route daily. Dx: E11.9 11/03/16   Brunetta Jeans, PA-C    Current Facility-Administered Medications  Medication Dose Route Frequency Provider Last Rate Last Dose  . 0.9 %  sodium chloride infusion   Intravenous Continuous Ivor Costa, MD 150 mL/hr at 04/03/17 0014    . enoxaparin (LOVENOX) injection 40 mg  40 mg Subcutaneous Q24H Donne Hazel, MD   40 mg at 04/02/17 2100  . hydrALAZINE (APRESOLINE) injection 5 mg  5 mg Intravenous Q4H PRN Donne Hazel, MD      . insulin aspart (novoLOG) injection 0-15 Units  0-15 Units Subcutaneous Q4H Donne Hazel, MD   Stopped at 04/02/17 2030  . morphine 4 MG/ML injection 2 mg  2 mg Intravenous Q4H PRN Palumbo, April, MD      . ondansetron [facility]) injection 4 mg  4 mg Intravenous Q8H PRN Ivor Costa, MD        Allergies as of 04/02/2017  . (No Known Allergies)    Family History  Problem Relation Age of Onset  . Stroke Mother   . Heart attack Father   . Alzheimer's disease Sister        #1-Deceased  . Breast cancer Sister        #1  . Healthy Sister        #2    Social History   Social History  . Marital status: Married    Spouse name: N/A  . Number of children: 1  . Years of education: N/A   Occupational History  . Statistician    Social History Main Topics  . Smoking status: Never Smoker  . Smokeless tobacco: Never Used  . Alcohol use 0.0 oz/week     Comment: occas.  . Drug use: No  . Sexual activity: Yes    Partners: Male   Other Topics Concern  . Not on file   Social History Narrative  . No narrative on file    Review of Systems: Pertinent positive and negative review of systems were noted in the above HPI section.  All other review of systems was otherwise negative.  Physical Exam: Vital  signs in last 24 hours: Temp:  [97.8 F (36.6 C)-98.6 F (37 C)] 97.8 F (36.6 C) (09/29 0412) Pulse Rate:  [74-92] 87 (09/29 0412) Resp:  [16-20] 16 (09/29 0412) BP: (101-133)/(48-98) 133/76 (09/29 0412) SpO2:  [92 %-98 %] 98 % (09/29 0412) Weight:  [145 lb 11.6 oz (66.1 kg)-146 lb 2.6 oz (66.3 kg)] 145 lb 11.6 oz (66.1 kg) (09/29 0500) Last BM Date: 04/01/17 General:   Alert,  Well-developed, well-nourished,older [facility] pleasant and cooperative in NAD- Up walking around Head:  Normocephalic and atraumatic. Eyes:  Sclera clear, no icterus.   Conjunctiva pink. Ears:  Normal auditory acuity. Nose:  No deformity, discharge,  or  lesions. Mouth:  No deformity or lesions.   Neck:  Supple; no masses or thyromegaly. Lungs:  Clear throughout to auscultation.   No wheezes, crackles, or rhonchi. Heart:  Regular rate and rhythm; no murmurs, clicks, rubs,  or gallops. Abdomen:  Soft,nontender, BS active,nonpalp mass or hsm.   Rectal:  Deferred  Msk:  Symmetrical without gross deformities. . Pulses:  Normal pulses noted. Extremities:  Without clubbing or edema. Neurologic:  Alert and  oriented x4;  grossly normal neurologically. Skin:  Intact without significant lesions or rashes.. Psych:  Alert and cooperative. Normal mood and affect.  Intake/Output from previous day: 09/28 0701 - 09/29 0700 In: 3103.3 [I.V.:2903.3; IV Piggyback:200] Out: 6 [Urine:6] Intake/Output this shift: No intake/output data recorded.  Lab Results:  Recent Labs  04/02/17 0450 04/02/17 1841  WBC 13.5* 12.7*  HGB 13.5 12.8  HCT 39.8 37.6  PLT 290 234   BMET  Recent Labs  04/02/17 0450 04/02/17 1841 04/03/17 0349  NA 138  --  141  K 4.2  --  4.2  CL 99*  --  107  CO2 28  --  24  GLUCOSE 204*  --  105*  BUN 18  --  11  CREATININE 0.77 0.71 0.59  CALCIUM 10.0  --  8.9   LFT  Recent Labs  04/03/17 0349  PROT 6.5  ALBUMIN 3.4*  AST 350*  ALT 596*  ALKPHOS 412*  BILITOT 1.9*       IMPRESSION:   #1 generally healthy 66 year old white female with 2 week history of intermittent subxiphoid pain. Patient had a prolonged episode on 04/01/2017 that did not resolve until she received analgesic in the emergency room. This was also associated with 1 episode of nausea. Imaging shows intra-and extrahepatic biliary ductal dilation, CBD of 11 mm somewhat distended gallbladder, and probable ill-defined density at the pancreatic head with mild proximal pancreatic ductal dilation to 4 mm and indistinctness of the peripancreatic fat planes.  Patient had a very transient elevation of lipase. Her pain is completely gone today.  Primary concern is for underlying pancreatic head malignancy, causing biliary obstruction. Also cannot r/o choleocholithiasis-?passed   #2 adult-onset diabetes mellitus #3 anxiety    PLAN:  #1 start clear liquids #2 scheduled for MRI abdomen and MRCP #3 CEA and CA-19-9 #4 thank you for the consult, we will follow with you-further workup with EUS/ERCP with possible stent will be determined pending results of above.    Tonya Tapia  04/03/2017, 9:23 AM  I have reviewed the entire case in detail with the above APP and discussed the plan in detail.  Therefore, I agree with the diagnoses recorded above. In addition,  I have personally interviewed and examined the patient and have personally reviewed any abdominal/pelvic CT scan images.  My additional thoughts are as follows: Acute pancreatitis Elevated LFTs Abnormal imaging GI tract with biliary tract dilatation and possible pancreatic head abnormality  She had acute pancreatitis with significant elevation of LFTs, raising the concern for choledocholithiasis. Dilated biliary tree and gallbladder along with this possible microcystic appearing abnormality the pancreatic head raising concern for possible neoplasm. However, that were the case, I would not expect a cryptitis to resolve so quickly. It is  possible she has more than 1 process going on, and I wonder if she may have passed a bile duct stone. MRI abdomen and MRCP to further define pancreatic and biliary anatomy, see if there is a pancreatic head mass, and see if there are CBD  stones. It is encouraging that she is feeling better so quickly with normalization of lipase.   Tonya Tapia Pager [phone]  Mon-Fri 8a-5p [phone] after 5p, weekends, holidays

## 2017-04-04 DIAGNOSIS — K8050 Calculus of bile duct without cholangitis or cholecystitis without obstruction: Secondary | ICD-10-CM

## 2017-04-04 DIAGNOSIS — K851 Biliary acute pancreatitis without necrosis or infection: Principal | ICD-10-CM

## 2017-04-04 LAB — MAGNESIUM: Magnesium: 1.2 mg/dL — ABNORMAL LOW (ref 1.7–2.4)

## 2017-04-04 LAB — CBC WITH DIFFERENTIAL/PLATELET
Basophils Absolute: 0.1 10*3/uL (ref 0.0–0.1)
Basophils Relative: 1 %
EOS PCT: 7 %
Eosinophils Absolute: 0.6 10*3/uL (ref 0.0–0.7)
HEMATOCRIT: 33.3 % — AB (ref 36.0–46.0)
Hemoglobin: 11.2 g/dL — ABNORMAL LOW (ref 12.0–15.0)
LYMPHS ABS: 1.5 10*3/uL (ref 0.7–4.0)
LYMPHS PCT: 18 %
MCH: 29.7 pg (ref 26.0–34.0)
MCHC: 33.6 g/dL (ref 30.0–36.0)
MCV: 88.3 fL (ref 78.0–100.0)
MONO ABS: 0.9 10*3/uL (ref 0.1–1.0)
Monocytes Relative: 11 %
NEUTROS ABS: 5.3 10*3/uL (ref 1.7–7.7)
Neutrophils Relative %: 63 %
PLATELETS: 244 10*3/uL (ref 150–400)
RBC: 3.77 MIL/uL — AB (ref 3.87–5.11)
RDW: 13.3 % (ref 11.5–15.5)
WBC: 8.4 10*3/uL (ref 4.0–10.5)

## 2017-04-04 LAB — COMPREHENSIVE METABOLIC PANEL
ALBUMIN: 3 g/dL — AB (ref 3.5–5.0)
ALT: 322 U/L — ABNORMAL HIGH (ref 14–54)
AST: 117 U/L — AB (ref 15–41)
Alkaline Phosphatase: 287 U/L — ABNORMAL HIGH (ref 38–126)
Anion gap: 7 (ref 5–15)
BUN: 5 mg/dL — AB (ref 6–20)
CHLORIDE: 113 mmol/L — AB (ref 101–111)
CO2: 22 mmol/L (ref 22–32)
Calcium: 8.2 mg/dL — ABNORMAL LOW (ref 8.9–10.3)
Creatinine, Ser: 0.53 mg/dL (ref 0.44–1.00)
GFR calc Af Amer: >60 mL/min (ref >60)
GFR calc non Af Amer: >60 mL/min (ref >60)
GLUCOSE: 155 mg/dL — AB (ref 65–99)
POTASSIUM: 3.9 mmol/L (ref 3.5–5.1)
Sodium: 142 mmol/L (ref 135–145)
Total Bilirubin: 0.8 mg/dL (ref 0.3–1.2)
Total Protein: 5.7 g/dL — ABNORMAL LOW (ref 6.5–8.1)

## 2017-04-04 LAB — CANCER ANTIGEN 19-9: CAN 19-9: 88 U/mL — AB (ref 0–35)

## 2017-04-04 LAB — GLUCOSE, CAPILLARY
GLUCOSE-CAPILLARY: 181 mg/dL — AB (ref 65–99)
GLUCOSE-CAPILLARY: 173 mg/dL — AB (ref 65–99)
GLUCOSE-CAPILLARY: 210 mg/dL — AB (ref 65–99)
Glucose-Capillary: 222 mg/dL — ABNORMAL HIGH (ref 65–99)

## 2017-04-04 LAB — CEA: CEA: 2.2 ng/mL (ref 0.0–4.7)

## 2017-04-04 MED ORDER — MAGNESIUM SULFATE 2 GM/50ML IV SOLN
2.0000 g | Freq: Once | INTRAVENOUS | Status: AC
  Administered 2017-04-04: 2 g via INTRAVENOUS
  Filled 2017-04-04: qty 50

## 2017-04-04 NOTE — Progress Notes (Signed)
[facility] Progress Note  Patient: Tonya Tapia HYW:[id number]   PCP: Brunetta Jeans, PA-C DOB: 1950-12-09   DOA: 04/02/2017   DOS: 04/04/2017   Date of Service: the patient was seen and examined on 04/04/2017  Subjective: No abdominal pain no nausea no vomiting. Tolerating oral diet.  Brief hospital course: Pt. with type II DM, anxiety, restless leg syndrome; admitted on 04/02/2017, presented with complaint of abdominal pain, was found to have acute pancreatitis as well as pancreatic mass. GI consulted. Currently further plan is further workup of the pancreatitis.  Assessment and Plan: 1. Acute gallstone pancreatitis. Presents with complaints of abdominal pain lipase was elevated to 7000. Patient was kept nothing by mouth given IV fluids. When necessary nausea and pain medication. CT abdomen was performed which showed dilated CBD and pancreatic mass. Repeat lipase is significantly better. Abdominal pain and symptoms totally resolved. MRCP positive for CBD stone as well as gallstone. Patient scheduled for ERCP tomorrow. Will discuss with surgery regarding possible cholecystectomy prior to discharge.  2. Pancreatic mass. Suspicious for carcinoma. MRCP ordered by GI. Negative for any pancreatic mass. CEA and CA-19-9 he also ordered.  3. Type 2 diabetes mellitus. Continue sensitive sliding scale.  4. Elevated LFT. Getting better, due to CBD stone  Diet: Regular diet, nothing by mouth after midnight DVT Prophylaxis: subcutaneous Heparin  Advance goals of care discussion: full code  Family Communication: no family was present at bedside, at the time of interview.  Disposition:  Discharge to home.  Consultants: gastroenterology  Procedures: none  Antibiotics: Anti-infectives    Start     Dose/Rate Route Frequency Ordered Stop   04/02/17 0545  vancomycin (VANCOCIN) IVPB 1000 mg/200 mL premix     1,000 mg 200 mL/hr over 60 Minutes Intravenous  Once 04/02/17 0536  04/02/17 0858   04/02/17 0545  piperacillin-tazobactam (ZOSYN) IVPB 3.375 g     3.375 g 100 mL/hr over 30 Minutes Intravenous  Once 04/02/17 0536 04/02/17 0714       Objective: Physical Exam: Vitals:   04/03/17 1402 04/03/17 2104 04/04/17 0501 04/04/17 1331  BP: 131/67 125/65 121/60 117/68  Pulse: 76 76 72 86  Resp: 16 16 16 16   Temp: 97.8 F (36.6 C) 97.8 F (36.6 C) 97.9 F (36.6 C) 97.6 F (36.4 C)  TempSrc: Oral Oral Oral Oral  SpO2: 97% 98% 97% 95%  Weight:      Height:        Intake/Output Summary (Last 24 hours) at 04/04/17 1536 Last data filed at 04/04/17 1300  Gross per 24 hour  Intake             1740 ml  Output                0 ml  Net             1740 ml   Filed Weights   04/02/17 1736 04/03/17 0500  Weight: 66.3 kg (146 lb 2.6 oz) 66.1 kg (145 lb 11.6 oz)   General: Alert, Awake and Oriented to Time, Place and Person. Appear in mild distress, affect appropriate Eyes: PERRL, Conjunctiva normal ENT: Oral Mucosa clear moist. Neck: no JVD, no Abnormal Mass Or lumps Cardiovascular: S1 and S2 Present, no Murmur, Peripheral Pulses Present Respiratory: normal respiratory effort, Bilateral Air entry equal and Decreased, no use of accessory muscle, Clear to Auscultation, no Crackles, no wheezes Abdomen: Bowel Sound present, Soft and no tenderness, no hernia Skin: no redness, no Rash, no  induration Extremities: no Pedal edema, no calf tenderness Neurologic: Grossly no focal neuro deficit. Bilaterally Equal motor strength  Data Reviewed: CBC:  Recent Labs Lab 04/02/17 0450 04/02/17 1841 04/04/17 0450  WBC 13.5* 12.7* 8.4  NEUTROABS 9.0*  --  5.3  HGB 13.5 12.8 11.2*  HCT 39.8 37.6 33.3*  MCV 88.4 88.1 88.3  PLT 290 234 270   Basic Metabolic Panel:  Recent Labs Lab 04/02/17 0450 04/02/17 1841 04/03/17 0349 04/04/17 0450  NA 138  --  141 142  K 4.2  --  4.2 3.9  CL 99*  --  107 113*  CO2 28  --  24 22  GLUCOSE 204*  --  105* 155*  BUN 18  --   11 5*  CREATININE 0.77 0.71 0.59 0.53  CALCIUM 10.0  --  8.9 8.2*  MG  --   --   --  1.2*    Liver Function Tests:  Recent Labs Lab 04/02/17 0450 04/03/17 0349 04/04/17 0450  AST 696* 350* 117*  ALT 673* 596* 322*  ALKPHOS 443* 412* 287*  BILITOT 1.3* 1.9* 0.8  PROT 7.2 6.5 5.7*  ALBUMIN 3.9 3.4* 3.0*    Recent Labs Lab 04/02/17 0450 04/03/17 0349  LIPASE 7,620* 145*   No results for input(s): AMMONIA in the last 168 hours. Coagulation Profile: No results for input(s): INR, PROTIME in the last 168 hours. Cardiac Enzymes:  Recent Labs Lab 04/02/17 0450  TROPONINI <0.03   BNP (last 3 results) No results for input(s): PROBNP in the last 8760 hours. CBG:  Recent Labs Lab 04/03/17 1139 04/03/17 1715 04/03/17 2102 04/04/17 0735 04/04/17 1148  GLUCAP 138* 113* 189* 222* 181*   Studies: Mr Abdomen Mrcp W Wo Contast  Result Date: 04/03/2017 CLINICAL DATA:  Pancreatitis. EXAM: MRI ABDOMEN WITHOUT AND WITH CONTRAST (INCLUDING MRCP) TECHNIQUE: Multiplanar multisequence MR imaging of the abdomen was performed both before and after the administration of intravenous contrast. Heavily T2-weighted images of the biliary and pancreatic ducts were obtained, and three-dimensional MRCP images were rendered by post processing. CONTRAST:  39mL MULTIHANCE GADOBENATE DIMEGLUMINE 529 MG/ML IV SOLN COMPARISON:  CT 04/02/2017 FINDINGS: Lower chest: No acute findings. Hepatobiliary: No suspicious liver abnormality identified. Mild hepatic steatosis. No enhancing liver abnormalities. The gallbladder is distended. Multiple stones are identified within the gallbladder. A large stone within the neck of the gallbladder measures 1.5 cm peer the common bile duct measure 1 cm in maximum diameter. There is mild intrahepatic biliary dilatation. Two stones within the common bile duct are noted. The largest is identified distally measuring 6 mm. Pancreas: Mild edema involving the head of pancreas is  noted. No pancreatic mass. No pancreatic ductal dilatation. Spleen:  Within normal limits in size and appearance. Adrenals/Urinary Tract: The adrenal glands are normal. No kidney mass identified. No evidence of hydronephrosis. Stomach/Bowel: Visualized portions within the abdomen are unremarkable. Vascular/Lymphatic: No pathologically enlarged lymph nodes identified. No abdominal aortic aneurysm demonstrated. Other:  None. Musculoskeletal: No suspicious bone lesions identified. IMPRESSION: 1. Gallstones and choledocholithiasis. 2 stones are identified within the common bile duct resulting in CBD and intrahepatic biliary dilatation. 2. Distended gallbladder with mild gallbladder wall thickening. Cannot rule out cholecystitis. 3. Mild inflammation of the head of pancreas compatible with focal pancreatitis. Electronically Signed   By: Kerby Moors M.D.   On: 04/03/2017 19:13    Scheduled Meds: . FLUoxetine  40 mg Oral Daily  . fluticasone  2 spray Each Nare Daily  . insulin aspart  0-5 Units Subcutaneous QHS  . insulin aspart  0-9 Units Subcutaneous TID WC   Continuous Infusions: . sodium chloride 150 mL/hr at 04/04/17 1420   PRN Meds: hydrALAZINE, morphine injection, ondansetron (ZOFRAN) IV  Time spent: 35 minutes  Author: Berle Mull, MD [facility] Pager: [phone] 04/04/2017 3:36 PM  If 7PM-7AM, please contact night-coverage at [email or web address], password [facility]

## 2017-04-04 NOTE — Progress Notes (Addendum)
Patient ID: Tonya Tapia, female   DOB: 03-16-1951, 66 y.o.   MRN: [id number]    Progress Note   Subjective   Pt feels fine- absolutely no pain yesterday or today - very hungry-    Objective   Vital signs in last 24 hours: Temp:  [97.8 F (36.6 C)-97.9 F (36.6 C)] 97.9 F (36.6 C) (09/30 0501) Pulse Rate:  [72-76] 72 (09/30 0501) Resp:  [16] 16 (09/30 0501) BP: (121-131)/(60-67) 121/60 (09/30 0501) SpO2:  [97 %-98 %] 97 % (09/30 0501) Last BM Date: 04/03/17 General: older    white female in NAD Heart:  Regular rate and rhythm; no murmurs Lungs: Respirations even and unlabored, lungs CTA bilaterally Abdomen:  Soft, nontender and nondistended. Normal bowel sounds. Extremities:  Without edema. Neurologic:  Alert and oriented,  grossly normal neurologically. Psych:  Cooperative. Normal mood and affect.  Intake/Output from previous day: 09/29 0701 - 09/30 0700 In: 1260 [P.O.:1260] Out: -  Intake/Output this shift: No intake/output data recorded.  Lab Results:  Recent Labs  04/02/17 0450 04/02/17 1841 04/04/17 0450  WBC 13.5* 12.7* 8.4  HGB 13.5 12.8 11.2*  HCT 39.8 37.6 33.3*  PLT 290 234 244   BMET  Recent Labs  04/02/17 0450 04/02/17 1841 04/03/17 0349 04/04/17 0450  NA 138  --  141 142  K 4.2  --  4.2 3.9  CL 99*  --  107 113*  CO2 28  --  24 22  GLUCOSE 204*  --  105* 155*  BUN 18  --  11 5*  CREATININE 0.77 0.71 0.59 0.53  CALCIUM 10.0  --  8.9 8.2*   LFT  Recent Labs  04/04/17 0450  PROT 5.7*  ALBUMIN 3.0*  AST 117*  ALT 322*  ALKPHOS 287*  BILITOT 0.8   PT/INR No results for input(s): LABPROT, INR in the last 72 hours.  Studies/Results: Mr Abdomen Mrcp Moise Boring Contast  Result Date: 04/03/2017 CLINICAL DATA:  Pancreatitis. EXAM: MRI ABDOMEN WITHOUT AND WITH CONTRAST (INCLUDING MRCP) TECHNIQUE: Multiplanar multisequence MR imaging of the abdomen was performed both before and after the administration of intravenous contrast. Heavily  T2-weighted images of the biliary and pancreatic ducts were obtained, and three-dimensional MRCP images were rendered by post processing. CONTRAST:  36mL MULTIHANCE GADOBENATE DIMEGLUMINE 529 MG/ML IV SOLN COMPARISON:  CT 04/02/2017 FINDINGS: Lower chest: No acute findings. Hepatobiliary: No suspicious liver abnormality identified. Mild hepatic steatosis. No enhancing liver abnormalities. The gallbladder is distended. Multiple stones are identified within the gallbladder. A large stone within the neck of the gallbladder measures 1.5 cm peer the common bile duct measure 1 cm in maximum diameter. There is mild intrahepatic biliary dilatation. Two stones within the common bile duct are noted. The largest is identified distally measuring 6 mm. Pancreas: Mild edema involving the head of pancreas is noted. No pancreatic mass. No pancreatic ductal dilatation. Spleen:  Within normal limits in size and appearance. Adrenals/Urinary Tract: The adrenal glands are normal. No kidney mass identified. No evidence of hydronephrosis. Stomach/Bowel: Visualized portions within the abdomen are unremarkable. Vascular/Lymphatic: No pathologically enlarged lymph nodes identified. No abdominal aortic aneurysm demonstrated. Other:  None. Musculoskeletal: No suspicious bone lesions identified. IMPRESSION: 1. Gallstones and choledocholithiasis. 2 stones are identified within the common bile duct resulting in CBD and intrahepatic biliary dilatation. 2. Distended gallbladder with mild gallbladder wall thickening. Cannot rule out cholecystitis. 3. Mild inflammation of the head of pancreas compatible with focal pancreatitis. Electronically Signed   By: Lovena Le  Clovis Riley M.D.   On: 04/03/2017 19:13    Review of systems No chest pain or dyspnea    Assessment / Plan:    #76 66 year old female with 2 week history of intermittent subxiphoid pain, admitted 04/01/2017 after a more prolonged episode. Patient had resolution of symptoms after analgesia  in the emergency room and has not had any further abdominal pain, no nausea or vomiting. Patient had very transient elevation of lipase. CT showed intra-and extrahepatic biliary ductal dilation, CBD of 11 mm, somewhat distended gallbladder and question ill-defined density at the pancreatic head with mild proximal pancreatic ductal dilation to 4 mm, could not rule out neoplasm.  MRI/MRCP as above fortunately shows no evidence of pancreatic head mass, she does have 2 stones in the common bile duct and a large stone in the neck of the gallbladder, as well as a distended gallbladder with mild gallbladder wall thickening and minimal focal pancreatic head inflammation.   Plan; Will advance diet today, nothing by mouth after midnight Patient will be scheduled for ERCP, sphincterotomy and stone extraction with Dr. Lucio Edward for tomorrow 04/05/2017. Procedure was discussed with patient in detail this morning including risks of bleeding, perforation and 5% risk of pancreatitis. She is agreeable to proceed  Patient will need laparoscopic cholecystectomy, and hopefully that can be scheduled for early this week as well.   Contact  Amy Coeburn, P.A.-C               [phone]      Principal Problem:   Pancreatitis Active Problems:   History of partial thyroidectomy   Type 2 diabetes mellitus without complication, without long-term current use of insulin (HCC)   Abnormal LFTs   Pancreatic mass     LOS: 2 days   Amy Esterwood  04/04/2017, 8:54 AM   I have discussed the case with the PA, and that is the plan I formulated. I personally interviewed and examined the patient.  I reviewed the findings on MRI/MRCP with the patient. She is reassured that there is no pancreatic mass seen.CBD stones requiring ERCP. This is on the schedule for tomorrow morning with Dr. Fuller Plan and I will sign the patient out to him in detail.  After the ERCP tomorrow, the patient needs a general surgery consult the  plan timing of her cholecystectomy. She does not appear to have acute cholecystitis, so it could potentially be done as an outpatient in the near future, depending upon patient and surgeon preference.  ERCP was described in detail, risks and benefits were reviewed, and she is agreeable.  The benefits and risks of the planned procedure were described in detail with the patient or (when appropriate) their health care proxy.  Risks were outlined as including, but not limited to, bleeding, infection, perforation, adverse medication reaction leading to cardiac or pulmonary decompensation, or pancreatitis (if ERCP).  The limitation of incomplete mucosal visualization was also discussed.  No guarantees or warranties were given.   25 minutes were spent on this encounter. Greater than 50% of the time used for review of imaging and discussion of that with the radiologist,counseling as well as treatment plan and follow-up.  Nelida Meuse III Pager [phone]  Mon-Fri 8a-5p [phone] after 5p, weekends, holidays

## 2017-04-05 ENCOUNTER — Inpatient Hospital Stay (HOSPITAL_COMMUNITY): Payer: Medicare Other | Admitting: Anesthesiology

## 2017-04-05 ENCOUNTER — Inpatient Hospital Stay (HOSPITAL_COMMUNITY): Payer: Medicare Other

## 2017-04-05 ENCOUNTER — Encounter (HOSPITAL_COMMUNITY): Payer: Self-pay | Admitting: *Deleted

## 2017-04-05 ENCOUNTER — Encounter (HOSPITAL_COMMUNITY)
Admission: EM | Disposition: A | Discharge status: Home / Self Care | Payer: Self-pay | Source: Home / Self Care | Attending: Internal Medicine

## 2017-04-05 DIAGNOSIS — K8050 Calculus of bile duct without cholangitis or cholecystitis without obstruction: Secondary | ICD-10-CM

## 2017-04-05 DIAGNOSIS — K8051 Calculus of bile duct without cholangitis or cholecystitis with obstruction: Secondary | ICD-10-CM

## 2017-04-05 HISTORY — PX: ERCP: SHX5425

## 2017-04-05 LAB — COMPREHENSIVE METABOLIC PANEL
ALK PHOS: 246 U/L — AB (ref 38–126)
ALT: 239 U/L — AB (ref 14–54)
ALT: 228 U/L — ABNORMAL HIGH (ref 14–54)
ANION GAP: 7 (ref 5–15)
AST: 59 U/L — ABNORMAL HIGH (ref 15–41)
AST: 61 U/L — AB (ref 15–41)
Albumin: 3.1 g/dL — ABNORMAL LOW (ref 3.5–5.0)
Albumin: 3.3 g/dL — ABNORMAL LOW (ref 3.5–5.0)
Alkaline Phosphatase: 267 U/L — ABNORMAL HIGH (ref 38–126)
Anion gap: 10 (ref 5–15)
BILIRUBIN TOTAL: 0.3 mg/dL (ref 0.3–1.2)
BUN: 6 mg/dL (ref 6–20)
BUN: 6 mg/dL (ref 6–20)
CALCIUM: 8.3 mg/dL — AB (ref 8.9–10.3)
CO2: 23 mmol/L (ref 22–32)
CO2: 20 mmol/L — ABNORMAL LOW (ref 22–32)
CREATININE: 0.53 mg/dL (ref 0.44–1.00)
Calcium: 7.8 mg/dL — ABNORMAL LOW (ref 8.9–10.3)
Chloride: 111 mmol/L (ref 101–111)
Chloride: 109 mmol/L (ref 101–111)
Creatinine, Ser: 0.61 mg/dL (ref 0.44–1.00)
GFR calc Af Amer: >60 mL/min (ref >60)
Glucose, Bld: 136 mg/dL — ABNORMAL HIGH (ref 65–99)
Glucose, Bld: 265 mg/dL — ABNORMAL HIGH (ref 65–99)
POTASSIUM: 3.5 mmol/L (ref 3.5–5.1)
Potassium: 3.7 mmol/L (ref 3.5–5.1)
SODIUM: 141 mmol/L (ref 135–145)
Sodium: 139 mmol/L (ref 135–145)
TOTAL PROTEIN: 6.5 g/dL (ref 6.5–8.1)
Total Bilirubin: 0.6 mg/dL (ref 0.3–1.2)
Total Protein: 5.8 g/dL — ABNORMAL LOW (ref 6.5–8.1)

## 2017-04-05 LAB — CBC
HCT: 33.7 % — ABNORMAL LOW (ref 36.0–46.0)
Hemoglobin: 11.4 g/dL — ABNORMAL LOW (ref 12.0–15.0)
MCH: 29.3 pg (ref 26.0–34.0)
MCHC: 33.8 g/dL (ref 30.0–36.0)
MCV: 86.6 fL (ref 78.0–100.0)
PLATELETS: 244 10*3/uL (ref 150–400)
RBC: 3.89 MIL/uL (ref 3.87–5.11)
RDW: 13.2 % (ref 11.5–15.5)
WBC: 8.1 10*3/uL (ref 4.0–10.5)

## 2017-04-05 LAB — GLUCOSE, CAPILLARY
GLUCOSE-CAPILLARY: 142 mg/dL — AB (ref 65–99)
GLUCOSE-CAPILLARY: 299 mg/dL — AB (ref 65–99)
GLUCOSE-CAPILLARY: 268 mg/dL — AB (ref 65–99)
Glucose-Capillary: 236 mg/dL — ABNORMAL HIGH (ref 65–99)

## 2017-04-05 LAB — LIPASE, BLOOD: LIPASE: 49 U/L (ref 11–51)

## 2017-04-05 SURGERY — ERCP, WITH INTERVENTION IF INDICATED
Anesthesia: Spinal

## 2017-04-05 MED ORDER — SODIUM CHLORIDE 0.9 % IV SOLN
INTRAVENOUS | Status: DC | PRN
  Administered 2017-04-05: 25 mL

## 2017-04-05 MED ORDER — MIDAZOLAM HCL 5 MG/5ML IJ SOLN
INTRAMUSCULAR | Status: DC | PRN
  Administered 2017-04-05: 2 mg via INTRAVENOUS

## 2017-04-05 MED ORDER — PROPOFOL 10 MG/ML IV BOLUS
INTRAVENOUS | Status: DC | PRN
  Administered 2017-04-05: 100 mg via INTRAVENOUS
  Administered 2017-04-05: 50 mg via INTRAVENOUS

## 2017-04-05 MED ORDER — ROCURONIUM BROMIDE 10 MG/ML (PF) SYRINGE
PREFILLED_SYRINGE | INTRAVENOUS | Status: DC | PRN
  Administered 2017-04-05: 40 mg via INTRAVENOUS

## 2017-04-05 MED ORDER — MIDAZOLAM HCL 2 MG/2ML IJ SOLN
INTRAMUSCULAR | Status: AC
  Filled 2017-04-05: qty 2

## 2017-04-05 MED ORDER — EPHEDRINE 5 MG/ML INJ
INTRAVENOUS | Status: AC
  Filled 2017-04-05: qty 20

## 2017-04-05 MED ORDER — PHENYLEPHRINE 40 MCG/ML (10ML) SYRINGE FOR IV PUSH (FOR BLOOD PRESSURE SUPPORT)
PREFILLED_SYRINGE | INTRAVENOUS | Status: AC
  Filled 2017-04-05: qty 10

## 2017-04-05 MED ORDER — GLUCAGON HCL RDNA (DIAGNOSTIC) 1 MG IJ SOLR
INTRAMUSCULAR | Status: DC | PRN
  Administered 2017-04-05: 0.25 mg via INTRAVENOUS

## 2017-04-05 MED ORDER — GLUCAGON HCL RDNA (DIAGNOSTIC) 1 MG IJ SOLR
INTRAMUSCULAR | Status: AC
  Filled 2017-04-05: qty 1

## 2017-04-05 MED ORDER — PROPOFOL 10 MG/ML IV BOLUS
INTRAVENOUS | Status: AC
  Filled 2017-04-05: qty 20

## 2017-04-05 MED ORDER — LIDOCAINE 2% (20 MG/ML) 5 ML SYRINGE
INTRAMUSCULAR | Status: DC | PRN
  Administered 2017-04-05: 60 mg via INTRAVENOUS

## 2017-04-05 MED ORDER — INDOMETHACIN 50 MG RE SUPP
RECTAL | Status: DC | PRN
  Administered 2017-04-05: 100 mg via RECTAL

## 2017-04-05 MED ORDER — INDOMETHACIN 50 MG RE SUPP
RECTAL | Status: AC
  Filled 2017-04-05: qty 2

## 2017-04-05 MED ORDER — SUGAMMADEX SODIUM 200 MG/2ML IV SOLN
INTRAVENOUS | Status: DC | PRN
  Administered 2017-04-05: 150 mg via INTRAVENOUS

## 2017-04-05 MED ORDER — ONDANSETRON HCL 4 MG/2ML IJ SOLN
INTRAMUSCULAR | Status: AC
  Filled 2017-04-05: qty 2

## 2017-04-05 MED ORDER — PHENYLEPHRINE 40 MCG/ML (10ML) SYRINGE FOR IV PUSH (FOR BLOOD PRESSURE SUPPORT)
PREFILLED_SYRINGE | INTRAVENOUS | Status: DC | PRN
  Administered 2017-04-05 (×2): 80 ug via INTRAVENOUS

## 2017-04-05 MED ORDER — FENTANYL CITRATE (PF) 100 MCG/2ML IJ SOLN
INTRAMUSCULAR | Status: DC | PRN
  Administered 2017-04-05 (×2): 50 ug via INTRAVENOUS

## 2017-04-05 MED ORDER — SODIUM CHLORIDE 0.9 % IV SOLN
3.0000 g | INTRAVENOUS | Status: AC
  Administered 2017-04-05: 3 g via INTRAVENOUS
  Filled 2017-04-05: qty 3

## 2017-04-05 MED ORDER — DEXAMETHASONE SODIUM PHOSPHATE 10 MG/ML IJ SOLN
INTRAMUSCULAR | Status: DC | PRN
  Administered 2017-04-05: 10 mg via INTRAVENOUS

## 2017-04-05 MED ORDER — INDOMETHACIN 50 MG RE SUPP: 100.0000 mg | Freq: Once | RECTAL | Status: DC

## 2017-04-05 MED ORDER — FENTANYL CITRATE (PF) 250 MCG/5ML IJ SOLN
INTRAMUSCULAR | Status: AC
  Filled 2017-04-05: qty 5

## 2017-04-05 MED ORDER — LACTATED RINGERS IV SOLN
INTRAVENOUS | Status: DC | PRN
  Administered 2017-04-05 (×2): via INTRAVENOUS

## 2017-04-05 MED ORDER — DEXTROSE 5 % IV SOLN
2.0000 g | INTRAVENOUS | Status: DC
  Administered 2017-04-05 – 2017-04-08 (×3): 2 g via INTRAVENOUS
  Filled 2017-04-05 (×5): qty 2

## 2017-04-05 NOTE — Anesthesia Procedure Notes (Signed)
Procedure Name: Intubation Performed by: Gean Maidens Pre-anesthesia Checklist: Patient identified, Emergency Drugs available, Suction available, Patient being monitored and Timeout performed Patient Re-evaluated:Patient Re-evaluated prior to induction Oxygen Delivery Method: Circle system utilized Preoxygenation: Pre-oxygenation with 100% oxygen Induction Type: IV induction Ventilation: Mask ventilation without difficulty Laryngoscope Size: Mac and 3 Grade View: Grade I Tube type: Oral Tube size: 7.0 mm Number of attempts: 1 Airway Equipment and Method: Stylet Placement Confirmation: ETT inserted through vocal cords under direct vision,  positive ETCO2,  CO2 detector and breath sounds checked- equal and bilateral Secured at: 21 cm Tube secured with: Tape Dental Injury: Teeth and Oropharynx as per pre-operative assessment

## 2017-04-05 NOTE — H&P (View-Only) (Signed)
Patient ID: Tonya Tapia, female   DOB: [birth year], 66 y.o.   MRN: [id number]    Progress Note   Subjective   Pt feels fine- absolutely no pain yesterday or today - very hungry-    Objective   Vital signs in last 24 hours: Temp:  [97.8 F (36.6 C)-97.9 F (36.6 C)] 97.9 F (36.6 C) (09/30 0501) Pulse Rate:  [72-76] 72 (09/30 0501) Resp:  [16] 16 (09/30 0501) BP: (121-131)/(60-67) 121/60 (09/30 0501) SpO2:  [97 %-98 %] 97 % (09/30 0501) Last BM Date: 04/03/17 General: older    white female in NAD Heart:  Regular rate and rhythm; no murmurs Lungs: Respirations even and unlabored, lungs CTA bilaterally Abdomen:  Soft, nontender and nondistended. Normal bowel sounds. Extremities:  Without edema. Neurologic:  Alert and oriented,  grossly normal neurologically. Psych:  Cooperative. Normal mood and affect.  Intake/Output from previous day: 09/29 0701 - 09/30 0700 In: 1260 [P.O.:1260] Out: -  Intake/Output this shift: No intake/output data recorded.  Lab Results:  Recent Labs  04/02/17 0450 04/02/17 1841 04/04/17 0450  WBC 13.5* 12.7* 8.4  HGB 13.5 12.8 11.2*  HCT 39.8 37.6 33.3*  PLT 290 234 244   BMET  Recent Labs  04/02/17 0450 04/02/17 1841 04/03/17 0349 04/04/17 0450  NA 138  --  141 142  K 4.2  --  4.2 3.9  CL 99*  --  107 113*  CO2 28  --  24 22  GLUCOSE 204*  --  105* 155*  BUN 18  --  11 5*  CREATININE 0.77 0.71 0.59 0.53  CALCIUM 10.0  --  8.9 8.2*   LFT  Recent Labs  04/04/17 0450  PROT 5.7*  ALBUMIN 3.0*  AST 117*  ALT 322*  ALKPHOS 287*  BILITOT 0.8   PT/INR No results for input(s): LABPROT, INR in the last 72 hours.  Studies/Results: Mr Abdomen Mrcp Moise Boring Contast  Result Date: 04/03/2017 CLINICAL DATA:  Pancreatitis. EXAM: MRI ABDOMEN WITHOUT AND WITH CONTRAST (INCLUDING MRCP) TECHNIQUE: Multiplanar multisequence MR imaging of the abdomen was performed both before and after the administration of intravenous contrast. Heavily  T2-weighted images of the biliary and pancreatic ducts were obtained, and three-dimensional MRCP images were rendered by post processing. CONTRAST:  72mL MULTIHANCE GADOBENATE DIMEGLUMINE 529 MG/ML IV SOLN COMPARISON:  CT 04/02/2017 FINDINGS: Lower chest: No acute findings. Hepatobiliary: No suspicious liver abnormality identified. Mild hepatic steatosis. No enhancing liver abnormalities. The gallbladder is distended. Multiple stones are identified within the gallbladder. A large stone within the neck of the gallbladder measures 1.5 cm peer the common bile duct measure 1 cm in maximum diameter. There is mild intrahepatic biliary dilatation. Two stones within the common bile duct are noted. The largest is identified distally measuring 6 mm. Pancreas: Mild edema involving the head of pancreas is noted. No pancreatic mass. No pancreatic ductal dilatation. Spleen:  Within normal limits in size and appearance. Adrenals/Urinary Tract: The adrenal glands are normal. No kidney mass identified. No evidence of hydronephrosis. Stomach/Bowel: Visualized portions within the abdomen are unremarkable. Vascular/Lymphatic: No pathologically enlarged lymph nodes identified. No abdominal aortic aneurysm demonstrated. Other:  None. Musculoskeletal: No suspicious bone lesions identified. IMPRESSION: 1. Gallstones and choledocholithiasis. 2 stones are identified within the common bile duct resulting in CBD and intrahepatic biliary dilatation. 2. Distended gallbladder with mild gallbladder wall thickening. Cannot rule out cholecystitis. 3. Mild inflammation of the head of pancreas compatible with focal pancreatitis. Electronically Signed   By: Lovena Le  Clovis Riley M.D.   On: 04/03/2017 19:13    Review of systems No chest pain or dyspnea    Assessment / Plan:    #47 66 year old female with 2 week history of intermittent subxiphoid pain, admitted 04/01/2017 after a more prolonged episode. Patient had resolution of symptoms after analgesia  in the emergency room and has not had any further abdominal pain, no nausea or vomiting. Patient had very transient elevation of lipase. CT showed intra-and extrahepatic biliary ductal dilation, CBD of 11 mm, somewhat distended gallbladder and question ill-defined density at the pancreatic head with mild proximal pancreatic ductal dilation to 4 mm, could not rule out neoplasm.  MRI/MRCP as above fortunately shows no evidence of pancreatic head mass, she does have 2 stones in the common bile duct and a large stone in the neck of the gallbladder, as well as a distended gallbladder with mild gallbladder wall thickening and minimal focal pancreatic head inflammation.   Plan; Will advance diet today, nothing by mouth after midnight Patient will be scheduled for ERCP, sphincterotomy and stone extraction with Dr. Lucio Edward for tomorrow 04/05/2017. Procedure was discussed with patient in detail this morning including risks of bleeding, perforation and 5% risk of pancreatitis. She is agreeable to proceed  Patient will need laparoscopic cholecystectomy, and hopefully that can be scheduled for early this week as well.   Contact  Tonya Tapia, P.A.-C               [phone]      Principal Problem:   Pancreatitis Active Problems:   History of partial thyroidectomy   Type 2 diabetes mellitus without complication, without long-term current use of insulin (HCC)   Abnormal LFTs   Pancreatic mass     LOS: 2 days   Tonya Tapia  04/04/2017, 8:54 AM   I have discussed the case with the PA, and that is the plan I formulated. I personally interviewed and examined the patient.  I reviewed the findings on MRI/MRCP with the patient. She is reassured that there is no pancreatic mass seen.CBD stones requiring ERCP. This is on the schedule for tomorrow morning with Dr. Fuller Plan and I will sign the patient out to him in detail.  After the ERCP tomorrow, the patient needs a general surgery consult the  plan timing of her cholecystectomy. She does not appear to have acute cholecystitis, so it could potentially be done as an outpatient in the near future, depending upon patient and surgeon preference.  ERCP was described in detail, risks and benefits were reviewed, and she is agreeable.  The benefits and risks of the planned procedure were described in detail with the patient or (when appropriate) their health care proxy.  Risks were outlined as including, but not limited to, bleeding, infection, perforation, adverse medication reaction leading to cardiac or pulmonary decompensation, or pancreatitis (if ERCP).  The limitation of incomplete mucosal visualization was also discussed.  No guarantees or warranties were given.   25 minutes were spent on this encounter. Greater than 50% of the time used for review of imaging and discussion of that with the radiologist,counseling as well as treatment plan and follow-up.  Nelida Meuse III Pager [phone]  Mon-Fri 8a-5p [phone] after 5p, weekends, holidays

## 2017-04-05 NOTE — Anesthesia Preprocedure Evaluation (Addendum)
Anesthesia Evaluation  Patient identified by MRN, date of birth, ID band Patient awake    Reviewed: Allergy & Precautions, NPO status , Patient's Chart, lab work & pertinent test results  Airway Mallampati: II  TM Distance: >3 FB Neck ROM: Full    Dental no notable dental hx.    Pulmonary neg pulmonary ROS,    Pulmonary exam normal breath sounds clear to auscultation       Cardiovascular negative cardio ROS Normal cardiovascular exam Rhythm:Regular Rate:Normal     Neuro/Psych  Headaches, PSYCHIATRIC DISORDERS Anxiety Depression negative neurological ROS  negative psych ROS   GI/Hepatic negative GI ROS, Neg liver ROS,   Endo/Other  negative endocrine ROSdiabetes, Type 2  Renal/GU negative Renal ROS  negative genitourinary   Musculoskeletal negative musculoskeletal ROS (+)   Abdominal   Peds negative pediatric ROS (+)  Hematology negative hematology ROS (+)   Anesthesia Other Findings   Reproductive/Obstetrics negative OB ROS                             Anesthesia Physical Anesthesia Plan  ASA: III  Anesthesia Plan: General   Post-op Pain Management:  Regional for Post-op pain   Induction: Intravenous  PONV Risk Score and Plan: 2 and Ondansetron, Dexamethasone, Treatment may vary due to age or medical condition and Midazolam  Airway Management Planned: Oral ETT  Additional Equipment:   Intra-op Plan:   Post-operative Plan: Extubation in OR  Informed Consent:   Plan Discussed with:   Anesthesia Plan Comments: (  )       Anesthesia Quick Evaluation

## 2017-04-05 NOTE — Transfer of Care (Signed)
Immediate Anesthesia Transfer of Care Note  Patient: Tonya Tapia  Procedure(s) Performed: ENDOSCOPIC RETROGRADE CHOLANGIOPANCREATOGRAPHY (ERCP) (N/A )  Patient Location: PACU  Anesthesia Type:General  Level of Consciousness: awake, alert  and oriented  Airway & Oxygen Therapy: Patient Spontanous Breathing and Patient connected to face mask oxygen  Post-op Assessment: Report given to RN and Post -op Vital signs reviewed and stable  Post vital signs: Reviewed and stable  Last Vitals:  Vitals:   04/05/17 0500 04/05/17 0949  BP: 115/76 130/88  Pulse: 70 72  Resp: 16 10  Temp: 36.6 C 36.8 C  SpO2: 98% 99%    Last Pain:  Vitals:   04/05/17 0949  TempSrc: Oral  PainSc:          Complications: No apparent anesthesia complications

## 2017-04-05 NOTE — Anesthesia Postprocedure Evaluation (Signed)
Anesthesia Post Note  Patient: Tonya Tapia  Procedure(s) Performed: ENDOSCOPIC RETROGRADE CHOLANGIOPANCREATOGRAPHY (ERCP) (N/A )     Patient location during evaluation: PACU Anesthesia Type: Spinal Level of consciousness: oriented and awake and alert Pain management: pain level controlled Vital Signs Assessment: post-procedure vital signs reviewed and stable Respiratory status: spontaneous breathing, respiratory function stable and patient connected to nasal cannula oxygen Cardiovascular status: blood pressure returned to baseline and stable Postop Assessment: no headache, no backache and no apparent nausea or vomiting Anesthetic complications: no    Last Vitals:  Vitals:   04/05/17 1150 04/05/17 1155  BP:  139/78  Pulse: 74 75  Resp: 15 17  Temp:    SpO2: 96% 96%    Last Pain:  Vitals:   04/05/17 1122  TempSrc: Oral  PainSc:                  [name]

## 2017-04-05 NOTE — Op Note (Addendum)
[facility] Patient Name: Tonya Tapia Procedure Date: 04/05/2017 MRN: [id number] Attending MD: Ladene Artist , MD Date of Birth: [birth year] CSN: [id number] Age: 66 Admit Type: Inpatient Procedure:                ERCP Indications:              Bile duct stones on MRCP, Elevated liver enzymes Providers:                Pricilla Riffle. Fuller Plan, MD, Cleda Daub, RN, Cherylynn Ridges, Technician, Herbie Drape, CRNA Referring MD:             [facility] Medicines:                General Anesthesia Complications:            No immediate complications. Estimated Blood Loss:     Estimated blood loss: none. Procedure:                Pre-Anesthesia Assessment:                           - Prior to the procedure, a History and Physical                            was performed, and patient medications and                            allergies were reviewed. The patient's tolerance of                            previous anesthesia was also reviewed. The risks                            and benefits of the procedure and the sedation                            options and risks were discussed with the patient.                            All questions were answered, and informed consent                            was obtained. Prior Anticoagulants: The patient has                            taken no previous anticoagulant or antiplatelet                            agents. ASA Grade Assessment: II - A patient with                            mild systemic disease. After reviewing the risks  and benefits, the patient was deemed in                            satisfactory condition to undergo the procedure.                           After obtaining informed consent, the scope was                            passed under direct vision. Throughout the                            procedure, the patient's blood pressure, pulse, and                     oxygen saturations were monitored continuously. The                            [id number]) scope was introduced through                            the mouth, and used to inject contrast into and                            used to inject contrast into the bile duct. The                            ERCP was accomplished without difficulty. The                            patient tolerated the procedure well. Scope In: Scope Out: Findings:      The scout film was normal. The esophagus was successfully intubated       under direct vision. The scope was advanced to a normal major papilla in       the descending duodenum without detailed examination of the pharynx,       larynx and associated structures, and upper GI tract. The upper GI tract       was grossly normal. A straight Roadrunner wire was passed into the       biliary tree. The short-nosed traction sphincterotome was passed over       the guidewire and the bile duct was then deeply cannulated. Contrast was       injected. I personally interpreted the bile duct images. Ductal flow of       contrast was adequate. The lower third of the main bile duct contained       two stones, the largest of which was 10 mm in diameter. The common bile       duct was diffusely dilated, with a stone causing an obstruction. The       largest diameter was 63mm. The intrahepatic ducts were mildly diffusely       dilated. The GB did not fill. A 10 mm biliary sphincterotomy was made       with a traction (standard) sphincterotome using ERBE electrocautery.       There was no post-sphincterotomy bleeding. The biliary tree was swept  with a 12 mm balloon starting at the bifurcation. Two stones were       removed. No stones remained. Excellent biliary drainage was noted. The       PD was not cannulated or injected by intention. Impression:               - The common bile duct was dilated, with a stone                             causing an obstruction.                           - Choledocholithiasis was found. Complete removal                            was accomplished by biliary sphincterotomy and                            balloon extraction.                           - A biliary sphincterotomy was performed. Moderate Sedation:      N/A- Per Anesthesia Care Recommendation:           - Avoid aspirin and nonsteroidal anti-inflammatory                            medicines for 1 week.                           - Clear liquid diet today.                           - Refer to a surgeon for consideration of                            cholecystectomy.                           - Check LFTs in 1 month. Procedure Code(s):        --- Professional ---                           [phone], Endoscopic retrograde                            cholangiopancreatography (ERCP); with removal of                            calculi/debris from biliary/pancreatic duct(s)                           43262, Endoscopic retrograde                            cholangiopancreatography (ERCP); with                            sphincterotomy/papillotomy Diagnosis  Code(s):        --- Professional ---                           K80.51, Calculus of bile duct without cholangitis                            or cholecystitis with obstruction                           R74.8, Abnormal levels of other serum enzymes CPT copyright 2016 [employer]. All rights reserved. The codes documented in this report are preliminary and upon coder review may  be revised to meet current compliance requirements. Ladene Artist, MD 04/05/2017 11:19:13 AM This report has been signed electronically. Number of Addenda: 0

## 2017-04-05 NOTE — Progress Notes (Signed)
Day of Surgery    CC:  Abdominal pain  Subjective: S/pERCP this AM, no pain and she feels pretty good  Objective: Vital signs in last 24 hours: Temp:  [97.6 F (36.4 C)-98.2 F (36.8 C)] 97.6 F (36.4 C) (10/01 1122) Pulse Rate:  [70-90] 75 (10/01 1155) Resp:  [10-18] 17 (10/01 1155) BP: (115-145)/(76-88) 139/78 (10/01 1155) SpO2:  [95 %-100 %] 96 % (10/01 1155) Weight:  [65.8 kg (145 lb)] 65.8 kg (145 lb) (10/01 0949) Last BM Date: 04/03/17 1320 PO yesterday 7000 IV Voided x 8 13 liters Positive Afebrile, VSS Alk phos better, AST>ALT improvoing Lipase down to 49 this AM  ERCP earlier today  Intake/Output from previous day: 09/30 0701 - 10/01 0700 In: 8347.5 [P.O.:1320; I.V.:7027.5] Out: -  Intake/Output this shift: Total I/O In: 1000 [I.V.:1000] Out: -   General appearance: alert, cooperative and no distress Resp: clear to auscultation bilaterally GI: soft, denies tenderness now.  + BS  Lab Results:   Recent Labs  04/04/17 0450 04/05/17 0401  WBC 8.4 8.1  HGB 11.2* 11.4*  HCT 33.3* 33.7*  PLT 244 244    BMET  Recent Labs  04/04/17 0450 04/05/17 0401  NA 142 141  K 3.9 3.7  CL 113* 111  CO2 22 23  GLUCOSE 155* 136*  BUN 5* 6  CREATININE 0.53 0.53  CALCIUM 8.2* 8.3*   PT/INR No results for input(s): LABPROT, INR in the last 72 hours.   Recent Labs Lab 04/02/17 0450 04/03/17 0349 04/04/17 0450 04/05/17 0401  AST 696* 350* 117* 59*  ALT 673* 596* 322* 239*  ALKPHOS 443* 412* 287* 246*  BILITOT 1.3* 1.9* 0.8 0.6  PROT 7.2 6.5 5.7* 5.8*  ALBUMIN 3.9 3.4* 3.0* 3.1*     Lipase     Component Value Date/Time   LIPASE 49 04/05/2017 0401     Medications: . FLUoxetine  40 mg Oral Daily  . fluticasone  2 spray Each Nare Daily  . indomethacin  100 mg Rectal Once  . insulin aspart  0-5 Units Subcutaneous QHS  . insulin aspart  0-9 Units Subcutaneous TID WC   . sodium chloride 150 mL/hr at 04/05/17 1301   Anti-infectives    Start      Dose/Rate Route Frequency Ordered Stop   04/05/17 0800  Ampicillin-Sulbactam (UNASYN) 3 g in sodium chloride 0.9 % 100 mL IVPB     3 g 200 mL/hr over 30 Minutes Intravenous To Endoscopy 04/05/17 0756 04/05/17 1125   04/02/17 0545  vancomycin (VANCOCIN) IVPB 1000 mg/200 mL premix     1,000 mg 200 mL/hr over 60 Minutes Intravenous  Once 04/02/17 0536 04/02/17 0858   04/02/17 0545  piperacillin-tazobactam (ZOSYN) IVPB 3.375 g     3.375 g 100 mL/hr over 30 Minutes Intravenous  Once 04/02/17 0536 04/02/17 4098      Assessment/Plan Pancreatitis/pancreatic edema at the head of the pancrease Cholelithiasis/choledocholithiais S/p ERCP with stone removal, and biliary sphincterotomy, 04/05/17, Dr. Lucio Edward Type II diabetes FEN:  NPO/IV fluids [id number] 9/2/-9/29,  Unasyn 04/05/17 DVT:  SCD  Plan:  Clears tonight, NPO after MN and if she has no issues with post ERCP pancreatitis try to get her on for surgery tomorrow if schedule allows.       LOS: 3 days    Bianna Haran 04/05/2017 [phone]

## 2017-04-05 NOTE — Progress Notes (Signed)
[facility] Progress Note  Patient: Tonya Tapia OFB:[id number]   PCP: Brunetta Jeans, PA-C DOB: 30-Oct-1950   DOA: 04/02/2017   DOS: 04/05/2017   Date of Service: the patient was seen and examined on 04/05/2017  Subjective: Seen after ERCP, no acute complaints. Tolerating oral diet.  Brief hospital course: Pt. with type II DM, anxiety, restless leg syndrome; admitted on 04/02/2017, presented with complaint of abdominal pain, was found to have acute pancreatitis as well as pancreatic mass. GI consulted. Currently further plan is further workup of the pancreatitis.  Assessment and Plan: 1. Acute gallstone pancreatitis. Presents with complaints of abdominal pain lipase was elevated to 7000. Patient was kept nothing by mouth given IV fluids. When necessary nausea and pain medication. CT abdomen was performed which showed dilated CBD and pancreatic mass. Repeat lipase is significantly better. Abdominal pain and symptoms totally resolved. MRCP positive for CBD stone as well as gallstone. Underwent ERCP with stone removal. Tolerating oral diet. Scheduled for possible cholecystectomy tomorrow.  2. Pancreatic mass. Ruled out MRCP ordered by GI. Negative for any pancreatic mass. CEA and CA-19-9 he also ordered.  3. Type 2 diabetes mellitus. Continue sensitive sliding scale.  4. Elevated LFT. Getting better, due to CBD stone  Diet: Regular diet, nothing by mouth after midnight DVT Prophylaxis: subcutaneous Heparin  Advance goals of care discussion: full code  Family Communication: no family was present at bedside, at the time of interview.  Disposition:  Discharge to home.  Consultants: gastroenterology  Procedures: none  Antibiotics: Anti-infectives    Start     Dose/Rate Route Frequency Ordered Stop   04/05/17 1500  cefTRIAXone (ROCEPHIN) 2 g in dextrose 5 % 50 mL IVPB     2 g 100 mL/hr over 30 Minutes Intravenous Every 24 hours 04/05/17 1415     04/05/17 0800   Ampicillin-Sulbactam (UNASYN) 3 g in sodium chloride 0.9 % 100 mL IVPB     3 g 200 mL/hr over 30 Minutes Intravenous To Endoscopy 04/05/17 0756 04/05/17 1125   04/02/17 0545  vancomycin (VANCOCIN) IVPB 1000 mg/200 mL premix     1,000 mg 200 mL/hr over 60 Minutes Intravenous  Once 04/02/17 0536 04/02/17 0858   04/02/17 0545  piperacillin-tazobactam (ZOSYN) IVPB 3.375 g     3.375 g 100 mL/hr over 30 Minutes Intravenous  Once 04/02/17 0536 04/02/17 0714       Objective: Physical Exam: Vitals:   04/05/17 1145 04/05/17 1150 04/05/17 1155 04/05/17 1620  BP:   139/78 (!) 148/88  Pulse: 80 74 75 71  Resp: 16 15 17 18   Temp:    97.6 F (36.4 C)  TempSrc:    Oral  SpO2: 95% 96% 96% 98%  Weight:      Height:        Intake/Output Summary (Last 24 hours) at 04/05/17 1830 Last data filed at 04/05/17 1103  Gross per 24 hour  Intake           8027.5 ml  Output                0 ml  Net           8027.5 ml   Filed Weights   04/02/17 1736 04/03/17 0500 04/05/17 0949  Weight: 66.3 kg (146 lb 2.6 oz) 66.1 kg (145 lb 11.6 oz) 65.8 kg (145 lb)   General: Alert, Awake and Oriented to Time, Place and Person. Appear in mild distress, affect appropriate Eyes: PERRL, Conjunctiva normal ENT:  Oral Mucosa clear moist. Neck: no JVD, no Abnormal Mass Or lumps Cardiovascular: S1 and S2 Present, no Murmur, Peripheral Pulses Present Respiratory: normal respiratory effort, Bilateral Air entry equal and Decreased, no use of accessory muscle, Clear to Auscultation, no Crackles, no wheezes Abdomen: Bowel Sound present, Soft and no tenderness, no hernia Skin: no redness, no Rash, no induration Extremities: no Pedal edema, no calf tenderness Neurologic: Grossly no focal neuro deficit. Bilaterally Equal motor strength  Data Reviewed: CBC:  Recent Labs Lab 04/02/17 0450 04/02/17 1841 04/04/17 0450 04/05/17 0401  WBC 13.5* 12.7* 8.4 8.1  NEUTROABS 9.0*  --  5.3  --   HGB 13.5 12.8 11.2* 11.4*  HCT  39.8 37.6 33.3* 33.7*  MCV 88.4 88.1 88.3 86.6  PLT 290 234 244 315   Basic Metabolic Panel:  Recent Labs Lab 04/02/17 0450 04/02/17 1841 04/03/17 0349 04/04/17 0450 04/05/17 0401 04/05/17 1506  NA 138  --  141 142 141 139  K 4.2  --  4.2 3.9 3.7 3.5  CL 99*  --  107 113* 111 109  CO2 28  --  24 22 23  20*  GLUCOSE 204*  --  105* 155* 136* 265*  BUN 18  --  11 5* 6 6  CREATININE 0.77 0.71 0.59 0.53 0.53 0.61  CALCIUM 10.0  --  8.9 8.2* 8.3* 7.8*  MG  --   --   --  1.2*  --   --     Liver Function Tests:  Recent Labs Lab 04/02/17 0450 04/03/17 0349 04/04/17 0450 04/05/17 0401 04/05/17 1506  AST 696* 350* 117* 59* 61*  ALT 673* 596* 322* 239* 228*  ALKPHOS 443* 412* 287* 246* 267*  BILITOT 1.3* 1.9* 0.8 0.6 0.3  PROT 7.2 6.5 5.7* 5.8* 6.5  ALBUMIN 3.9 3.4* 3.0* 3.1* 3.3*    Recent Labs Lab 04/02/17 0450 04/03/17 0349 04/05/17 0401  LIPASE 7,620* 145* 49   No results for input(s): AMMONIA in the last 168 hours. Coagulation Profile: No results for input(s): INR, PROTIME in the last 168 hours. Cardiac Enzymes:  Recent Labs Lab 04/02/17 0450  TROPONINI <0.03   BNP (last 3 results) No results for input(s): PROBNP in the last 8760 hours. CBG:  Recent Labs Lab 04/04/17 1700 04/04/17 2201 04/05/17 0800 04/05/17 1306 04/05/17 1709  GLUCAP 173* 210* 142* 236* 299*   Studies: Dg Ercp Biliary & Pancreatic Ducts  Result Date: 04/05/2017 CLINICAL DATA:  Bile duct stone. EXAM: ERCP TECHNIQUE: Multiple spot images obtained with the fluoroscopic device and submitted for interpretation post-procedure. FLUOROSCOPY TIME:  Fluoroscopy Time:  1 minutes and 27 seconds Number of Acquired Spot Images: 11 COMPARISON:  MRCP 04/03/2017 FINDINGS: Opacification and cannulation of the common bile duct. Filling defects in common bile duct are compatible with stones. Evidence for a balloon sweep for stone removal. IMPRESSION: Choledocholithiasis and stone removal. These images  were submitted for radiologic interpretation only. Please see the procedural report for the amount of contrast and the fluoroscopy time utilized. Electronically Signed   By: Markus Daft M.D.   On: 04/05/2017 11:35    Scheduled Meds: . FLUoxetine  40 mg Oral Daily  . fluticasone  2 spray Each Nare Daily  . indomethacin  100 mg Rectal Once  . insulin aspart  0-5 Units Subcutaneous QHS  . insulin aspart  0-9 Units Subcutaneous TID WC   Continuous Infusions: . sodium chloride 150 mL/hr at 04/05/17 1301  . cefTRIAXone (ROCEPHIN)  IV 2 g (04/05/17  1600)   PRN Meds: hydrALAZINE, morphine injection, ondansetron (ZOFRAN) IV  Time spent: 35 minutes  Author: Berle Mull, MD [facility] Pager: [phone] 04/05/2017 6:30 PM  If 7PM-7AM, please contact night-coverage at [email or web address], password [facility]

## 2017-04-05 NOTE — Interval H&P Note (Signed)
History and Physical Interval Note:  04/05/2017 10:22 AM  Tonya Tapia  has presented today for surgery, with the diagnosis of CBD stones  The various methods of treatment have been discussed with the patient and family. After consideration of risks, benefits and other options for treatment, the patient has consented to  Procedure(s): ENDOSCOPIC RETROGRADE CHOLANGIOPANCREATOGRAPHY (ERCP) (N/A) as a surgical intervention .  The patient's history has been reviewed, patient examined, no change in status, stable for surgery.  I have reviewed the patient's chart and labs.  Questions were answered to the patient's satisfaction.     Pricilla Riffle. Fuller Plan

## 2017-04-06 ENCOUNTER — Encounter (HOSPITAL_COMMUNITY)
Admission: EM | Disposition: A | Discharge status: Home / Self Care | Payer: Self-pay | Source: Home / Self Care | Attending: Internal Medicine

## 2017-04-06 ENCOUNTER — Inpatient Hospital Stay (HOSPITAL_COMMUNITY): Payer: Medicare Other | Admitting: Anesthesiology

## 2017-04-06 ENCOUNTER — Encounter (HOSPITAL_COMMUNITY): Payer: Self-pay

## 2017-04-06 ENCOUNTER — Inpatient Hospital Stay (HOSPITAL_COMMUNITY): Payer: Medicare Other

## 2017-04-06 HISTORY — PX: CHOLECYSTECTOMY: SHX55

## 2017-04-06 LAB — CBC
HCT: 34.1 % — ABNORMAL LOW (ref 36.0–46.0)
Hemoglobin: 11.5 g/dL — ABNORMAL LOW (ref 12.0–15.0)
MCH: 29.1 pg (ref 26.0–34.0)
MCHC: 33.7 g/dL (ref 30.0–36.0)
MCV: 86.3 fL (ref 78.0–100.0)
PLATELETS: 233 10*3/uL (ref 150–400)
RBC: 3.95 MIL/uL (ref 3.87–5.11)
RDW: 13 % (ref 11.5–15.5)
WBC: 8 10*3/uL (ref 4.0–10.5)

## 2017-04-06 LAB — COMPREHENSIVE METABOLIC PANEL
ALBUMIN: 3.1 g/dL — AB (ref 3.5–5.0)
ALK PHOS: 237 U/L — AB (ref 38–126)
ALT: 190 U/L — ABNORMAL HIGH (ref 14–54)
ANION GAP: 6 (ref 5–15)
AST: 38 U/L (ref 15–41)
BUN: 10 mg/dL (ref 6–20)
CALCIUM: 8.6 mg/dL — AB (ref 8.9–10.3)
CO2: 25 mmol/L (ref 22–32)
Chloride: 110 mmol/L (ref 101–111)
Creatinine, Ser: 0.61 mg/dL (ref 0.44–1.00)
GFR calc non Af Amer: >60 mL/min (ref >60)
GLUCOSE: 237 mg/dL — AB (ref 65–99)
POTASSIUM: 4.1 mmol/L (ref 3.5–5.1)
SODIUM: 141 mmol/L (ref 135–145)
Total Bilirubin: 0.5 mg/dL (ref 0.3–1.2)
Total Protein: 6.2 g/dL — ABNORMAL LOW (ref 6.5–8.1)

## 2017-04-06 LAB — GLUCOSE, CAPILLARY
GLUCOSE-CAPILLARY: 102 mg/dL — AB (ref 65–99)
GLUCOSE-CAPILLARY: 281 mg/dL — AB (ref 65–99)
Glucose-Capillary: 178 mg/dL — ABNORMAL HIGH (ref 65–99)
Glucose-Capillary: 258 mg/dL — ABNORMAL HIGH (ref 65–99)
Glucose-Capillary: 256 mg/dL — ABNORMAL HIGH (ref 65–99)

## 2017-04-06 LAB — LIPASE, BLOOD: Lipase: 43 U/L (ref 11–51)

## 2017-04-06 LAB — MRSA PCR SCREENING: MRSA BY PCR: NEGATIVE

## 2017-04-06 LAB — HEMOGLOBIN A1C
HEMOGLOBIN A1C: 6.5 % — AB (ref 4.8–5.6)
MEAN PLASMA GLUCOSE: 139.85 mg/dL

## 2017-04-06 LAB — MAGNESIUM: Magnesium: 1.4 mg/dL — ABNORMAL LOW (ref 1.7–2.4)

## 2017-04-06 SURGERY — LAPAROSCOPIC CHOLECYSTECTOMY
Anesthesia: General

## 2017-04-06 MED ORDER — HYDROMORPHONE HCL-NACL 0.5-0.9 MG/ML-% IV SOSY: 0.2500 mg | PREFILLED_SYRINGE | INTRAVENOUS | Status: DC | PRN

## 2017-04-06 MED ORDER — SODIUM CHLORIDE 0.9 % IJ SOLN
INTRAMUSCULAR | Status: AC
  Filled 2017-04-06: qty 10

## 2017-04-06 MED ORDER — 0.9 % SODIUM CHLORIDE (POUR BTL) OPTIME
TOPICAL | Status: DC | PRN
  Administered 2017-04-06 (×2): 1000 mL

## 2017-04-06 MED ORDER — BUPIVACAINE-EPINEPHRINE 0.25% -1:200000 IJ SOLN
INTRAMUSCULAR | Status: DC | PRN
  Administered 2017-04-06: 26 mL

## 2017-04-06 MED ORDER — DEXAMETHASONE SODIUM PHOSPHATE 10 MG/ML IJ SOLN
INTRAMUSCULAR | Status: AC
  Filled 2017-04-06: qty 1

## 2017-04-06 MED ORDER — INSULIN ASPART 100 UNIT/ML ~~LOC~~ SOLN
5.0000 [IU] | Freq: Once | SUBCUTANEOUS | Status: AC
  Administered 2017-04-06: 5 [IU] via SUBCUTANEOUS

## 2017-04-06 MED ORDER — ROCURONIUM BROMIDE 50 MG/5ML IV SOSY
PREFILLED_SYRINGE | INTRAVENOUS | Status: AC
  Filled 2017-04-06: qty 5

## 2017-04-06 MED ORDER — HYDROMORPHONE HCL-NACL 0.5-0.9 MG/ML-% IV SOSY
1.0000 mg | PREFILLED_SYRINGE | INTRAVENOUS | Status: DC | PRN
  Administered 2017-04-06 – 2017-04-08 (×8): 1 mg via INTRAVENOUS
  Filled 2017-04-06 (×8): qty 2

## 2017-04-06 MED ORDER — CEFAZOLIN SODIUM-DEXTROSE 2-4 GM/100ML-% IV SOLN
2.0000 g | Freq: Once | INTRAVENOUS | Status: AC
  Administered 2017-04-06: 2 g via INTRAVENOUS

## 2017-04-06 MED ORDER — HYDROMORPHONE HCL-NACL 0.5-0.9 MG/ML-% IV SOSY
PREFILLED_SYRINGE | INTRAVENOUS | Status: AC
  Filled 2017-04-06: qty 2

## 2017-04-06 MED ORDER — SUGAMMADEX SODIUM 200 MG/2ML IV SOLN
INTRAVENOUS | Status: AC
  Filled 2017-04-06: qty 2

## 2017-04-06 MED ORDER — PROPOFOL 10 MG/ML IV BOLUS
INTRAVENOUS | Status: AC
  Filled 2017-04-06: qty 20

## 2017-04-06 MED ORDER — IOPAMIDOL (ISOVUE-300) INJECTION 61%
INTRAVENOUS | Status: DC | PRN
  Administered 2017-04-06: 7 mL

## 2017-04-06 MED ORDER — INSULIN ASPART 100 UNIT/ML ~~LOC~~ SOLN
SUBCUTANEOUS | Status: AC
  Filled 2017-04-06: qty 1

## 2017-04-06 MED ORDER — LIDOCAINE 2% (20 MG/ML) 5 ML SYRINGE
INTRAMUSCULAR | Status: DC | PRN
  Administered 2017-04-06: 60 mg via INTRAVENOUS

## 2017-04-06 MED ORDER — PROMETHAZINE HCL 25 MG/ML IJ SOLN: 6.2500 mg | INTRAMUSCULAR | Status: DC | PRN

## 2017-04-06 MED ORDER — ONDANSETRON HCL 4 MG/2ML IJ SOLN
4.0000 mg | Freq: Four times a day (QID) | INTRAMUSCULAR | Status: DC | PRN
  Administered 2017-04-06 – 2017-04-07 (×3): 4 mg via INTRAVENOUS
  Filled 2017-04-06 (×3): qty 2

## 2017-04-06 MED ORDER — EPHEDRINE SULFATE-NACL 50-0.9 MG/10ML-% IV SOSY
PREFILLED_SYRINGE | INTRAVENOUS | Status: DC | PRN
  Administered 2017-04-06 (×2): 5 mg via INTRAVENOUS

## 2017-04-06 MED ORDER — ACETAMINOPHEN 650 MG RE SUPP: 650.0000 mg | Freq: Four times a day (QID) | RECTAL | Status: DC | PRN

## 2017-04-06 MED ORDER — ONDANSETRON 4 MG PO TBDP: 4.0000 mg | ORAL_TABLET | Freq: Four times a day (QID) | ORAL | Status: DC | PRN

## 2017-04-06 MED ORDER — KCL IN DEXTROSE-NACL 20-5-0.45 MEQ/L-%-% IV SOLN
INTRAVENOUS | Status: DC
  Administered 2017-04-06 – 2017-04-09 (×5): via INTRAVENOUS
  Filled 2017-04-06 (×6): qty 1000

## 2017-04-06 MED ORDER — LACTATED RINGERS IV SOLN
INTRAVENOUS | Status: DC
  Administered 2017-04-06: 1000 mL via INTRAVENOUS
  Administered 2017-04-06: 13:00:00 via INTRAVENOUS

## 2017-04-06 MED ORDER — HYDROMORPHONE HCL 1 MG/ML IJ SOLN
INTRAMUSCULAR | Status: DC | PRN
  Administered 2017-04-06 (×5): .4 mg via INTRAVENOUS

## 2017-04-06 MED ORDER — FENTANYL CITRATE (PF) 100 MCG/2ML IJ SOLN
INTRAMUSCULAR | Status: AC
  Filled 2017-04-06: qty 2

## 2017-04-06 MED ORDER — MAGNESIUM SULFATE 2 GM/50ML IV SOLN
2.0000 g | Freq: Once | INTRAVENOUS | Status: AC
  Administered 2017-04-06: 2 g via INTRAVENOUS
  Filled 2017-04-06: qty 50

## 2017-04-06 MED ORDER — LACTATED RINGERS IR SOLN
Status: DC | PRN
  Administered 2017-04-06: 1000 mL

## 2017-04-06 MED ORDER — KETOROLAC TROMETHAMINE 30 MG/ML IJ SOLN
INTRAMUSCULAR | Status: AC
  Filled 2017-04-06: qty 1

## 2017-04-06 MED ORDER — LIDOCAINE 2% (20 MG/ML) 5 ML SYRINGE
INTRAMUSCULAR | Status: AC
  Filled 2017-04-06: qty 5

## 2017-04-06 MED ORDER — ONDANSETRON HCL 4 MG/2ML IJ SOLN
INTRAMUSCULAR | Status: AC
  Filled 2017-04-06: qty 2

## 2017-04-06 MED ORDER — ONDANSETRON HCL 4 MG/2ML IJ SOLN
INTRAMUSCULAR | Status: DC | PRN
  Administered 2017-04-06: 4 mg via INTRAVENOUS

## 2017-04-06 MED ORDER — KETOROLAC TROMETHAMINE 30 MG/ML IJ SOLN: 30.0000 mg | Freq: Once | INTRAMUSCULAR | Status: DC | PRN

## 2017-04-06 MED ORDER — MIDAZOLAM HCL 2 MG/2ML IJ SOLN
INTRAMUSCULAR | Status: AC
  Filled 2017-04-06: qty 2

## 2017-04-06 MED ORDER — ACETAMINOPHEN 325 MG PO TABS
650.0000 mg | ORAL_TABLET | Freq: Four times a day (QID) | ORAL | Status: DC | PRN
  Administered 2017-04-09: 650 mg via ORAL
  Filled 2017-04-06: qty 2

## 2017-04-06 MED ORDER — EPHEDRINE 5 MG/ML INJ
INTRAVENOUS | Status: AC
  Filled 2017-04-06: qty 10

## 2017-04-06 MED ORDER — IOPAMIDOL (ISOVUE-300) INJECTION 61%
INTRAVENOUS | Status: AC
  Filled 2017-04-06: qty 50

## 2017-04-06 MED ORDER — PROPOFOL 10 MG/ML IV BOLUS
INTRAVENOUS | Status: DC | PRN
  Administered 2017-04-06: 120 mg via INTRAVENOUS

## 2017-04-06 MED ORDER — ROCURONIUM BROMIDE 50 MG/5ML IV SOSY
PREFILLED_SYRINGE | INTRAVENOUS | Status: DC | PRN
  Administered 2017-04-06: 30 mg via INTRAVENOUS
  Administered 2017-04-06 (×2): 10 mg via INTRAVENOUS

## 2017-04-06 MED ORDER — HYDROMORPHONE HCL 2 MG/ML IJ SOLN
INTRAMUSCULAR | Status: AC
  Filled 2017-04-06: qty 1

## 2017-04-06 MED ORDER — DEXAMETHASONE SODIUM PHOSPHATE 10 MG/ML IJ SOLN
INTRAMUSCULAR | Status: DC | PRN
  Administered 2017-04-06: 10 mg via INTRAVENOUS

## 2017-04-06 MED ORDER — HYDROCODONE-ACETAMINOPHEN 5-325 MG PO TABS
1.0000 | ORAL_TABLET | ORAL | Status: DC | PRN
  Administered 2017-04-08 (×2): 2 via ORAL
  Filled 2017-04-06 (×2): qty 2

## 2017-04-06 MED ORDER — MIDAZOLAM HCL 2 MG/2ML IJ SOLN
INTRAMUSCULAR | Status: DC | PRN
  Administered 2017-04-06: 2 mg via INTRAVENOUS

## 2017-04-06 MED ORDER — BUPIVACAINE-EPINEPHRINE (PF) 0.25% -1:200000 IJ SOLN
INTRAMUSCULAR | Status: AC
  Filled 2017-04-06: qty 30

## 2017-04-06 MED ORDER — FENTANYL CITRATE (PF) 100 MCG/2ML IJ SOLN
INTRAMUSCULAR | Status: DC | PRN
  Administered 2017-04-06 (×2): 100 ug via INTRAVENOUS

## 2017-04-06 MED ORDER — SUGAMMADEX SODIUM 200 MG/2ML IV SOLN
INTRAVENOUS | Status: DC | PRN
  Administered 2017-04-06: 150 mg via INTRAVENOUS

## 2017-04-06 SURGICAL SUPPLY — 36 items
APPLIER CLIP ROT 10 11.4 M/L (STAPLE) ×3
CABLE HIGH FREQUENCY MONO STRZ (ELECTRODE) ×3 IMPLANT
CHLORAPREP W/TINT 26ML (MISCELLANEOUS) ×3 IMPLANT
CLIP APPLIE ROT 10 11.4 M/L (STAPLE) ×1 IMPLANT
CLOSURE WOUND 1/2 X4 (GAUZE/BANDAGES/DRESSINGS) ×1
COVER MAYO STAND STRL (DRAPES) ×3 IMPLANT
COVER SURGICAL LIGHT HANDLE (MISCELLANEOUS) ×3 IMPLANT
DECANTER SPIKE VIAL GLASS SM (MISCELLANEOUS) ×3 IMPLANT
DRAIN CHANNEL 19F RND (DRAIN) ×3 IMPLANT
DRAPE C-ARM 42X120 X-RAY (DRAPES) ×3 IMPLANT
DRSG TEGADERM 2-3/8X2-3/4 SM (GAUZE/BANDAGES/DRESSINGS) ×3 IMPLANT
ELECT REM PT RETURN 15FT ADLT (MISCELLANEOUS) ×3 IMPLANT
EVACUATOR SILICONE 100CC (DRAIN) ×3 IMPLANT
GAUZE SPONGE 2X2 8PLY STRL LF (GAUZE/BANDAGES/DRESSINGS) ×1 IMPLANT
GLOVE SURG ORTHO 8.0 STRL STRW (GLOVE) ×3 IMPLANT
GOWN STRL REUS W/TWL XL LVL3 (GOWN DISPOSABLE) ×6 IMPLANT
HEMOSTAT SURGICEL 4X8 (HEMOSTASIS) IMPLANT
KIT BASIN OR (CUSTOM PROCEDURE TRAY) ×3 IMPLANT
POUCH SPECIMEN RETRIEVAL 10MM (ENDOMECHANICALS) ×3 IMPLANT
SCISSORS LAP 5X35 DISP (ENDOMECHANICALS) ×3 IMPLANT
SET CHOLANGIOGRAPH MIX (MISCELLANEOUS) ×3 IMPLANT
SET IRRIG TUBING LAPAROSCOPIC (IRRIGATION / IRRIGATOR) ×3 IMPLANT
SLEEVE XCEL OPT CAN 5 100 (ENDOMECHANICALS) ×3 IMPLANT
SPONGE DRAIN TRACH 4X4 STRL 2S (GAUZE/BANDAGES/DRESSINGS) ×3 IMPLANT
SPONGE GAUZE 2X2 STER 10/PKG (GAUZE/BANDAGES/DRESSINGS) ×2
STRIP CLOSURE SKIN 1/2X4 (GAUZE/BANDAGES/DRESSINGS) ×2 IMPLANT
SUT ETHILON 2 0 PS N (SUTURE) ×3 IMPLANT
SUT VIC AB 4-0 PS2 27 (SUTURE) ×3 IMPLANT
SYR 20CC LL (SYRINGE) ×6 IMPLANT
TAPE CLOTH 4X10 WHT NS (GAUZE/BANDAGES/DRESSINGS) IMPLANT
TOWEL OR 17X26 10 PK STRL BLUE (TOWEL DISPOSABLE) ×3 IMPLANT
TRAY LAPAROSCOPIC (CUSTOM PROCEDURE TRAY) ×3 IMPLANT
TROCAR BLADELESS OPT 5 100 (ENDOMECHANICALS) ×3 IMPLANT
TROCAR XCEL BLUNT TIP 100MML (ENDOMECHANICALS) ×3 IMPLANT
TROCAR XCEL NON-BLD 11X100MML (ENDOMECHANICALS) ×3 IMPLANT
TUBING INSUF HEATED (TUBING) ×3 IMPLANT

## 2017-04-06 NOTE — Progress Notes (Signed)
General Surgery [facility]  Post op check.  Discussed operative findings with patient and husband - acute cholecystitis with cholelithiasis and empyema of gallbladder.  Await final path to rule out neoplasm.  Continue IV abx's for now.  Check labs in [place], [place] [facility] Office: [phone]

## 2017-04-06 NOTE — Anesthesia Postprocedure Evaluation (Signed)
Anesthesia Post Note  Patient: Tonya Tapia  Procedure(s) Performed: LAPAROSCOPIC CHOLECYSTECTOMY WITH INTRAOPERATIVE CHOLANGIOGRAM (N/A )     Patient location during evaluation: PACU Anesthesia Type: General Level of consciousness: awake and alert Pain management: pain level controlled Vital Signs Assessment: post-procedure vital signs reviewed and stable Respiratory status: spontaneous breathing, nonlabored ventilation, respiratory function stable and patient connected to nasal cannula oxygen Cardiovascular status: blood pressure returned to baseline and stable Postop Assessment: no apparent nausea or vomiting Anesthetic complications: no    Last Vitals:  Vitals:   04/06/17 1415 04/06/17 1430  BP: (!) 150/71 (!) 141/75  Pulse: 95 94  Resp: 16 15  Temp:    SpO2: 97% 95%    Last Pain:  Vitals:   04/06/17 1430  TempSrc:   PainSc: 3                  [name]

## 2017-04-06 NOTE — Progress Notes (Signed)
[facility] Progress Note  Patient: Tonya Tapia BHA:[id number]   PCP: Brunetta Jeans, PA-C DOB: [birth year]   DOA: 04/02/2017   DOS: 04/06/2017   Date of Service: the patient was seen and examined on 04/06/2017  Subjective: Seen after after cholecystectomy. Pain is well-controlled. No nausea no vomiting. Tolerated procedure well.  Brief hospital course: Pt. with type II DM, anxiety, restless leg syndrome; admitted on 04/02/2017, presented with complaint of abdominal pain, was found to have acute pancreatitis as well as pancreatic mass. GI consulted.MRCP was positive, underwent ERCP tolerated it well. Underwent laparoscopic cholecystectomy, seen to have empyema of the gallbladder. Now has JP drain.  Currently further plan is monitor post-op recovery.   Assessment and Plan: 1. Acute gallstone pancreatitis. Empyema of the gall-bladder.  Presents with complaints of abdominal pain lipase was elevated to 7000. Patient was kept nothing by mouth given IV fluids. When necessary nausea and pain medication. CT abdomen was performed which showed dilated CBD and pancreatic mass. Repeat lipase is significantly better. Abdominal pain and symptoms totally resolved. MRCP positive for CBD stone as well as gallstone. Underwent ERCP with stone removal on 10/01. Tolerating oral diet. Underwent lap cholecystectomy 10/02. Seen to have empyema of the gall bladder. Now has JP drain. On ceftriaxone. Management per General surgery   2. Pancreatic mass. Ruled out MRCP ordered by GI. Negative for any pancreatic mass. CEA normal nd CA-19-9 mildly elevated.  Gallbladder sent for pathology   3. Type 2 diabetes mellitus. Continue sensitive sliding scale.  4. Elevated LFT. Getting better, due to CBD stone  Diet: per General surgery  DVT Prophylaxis: subcutaneous Heparin  Advance goals of care discussion: full code  Family Communication: no family was present at bedside, at the time of interview.    Disposition:  Discharge to home.  Consultants: gastroenterology  Procedures: none  Antibiotics: Anti-infectives    Start     Dose/Rate Route Frequency Ordered Stop   04/06/17 1145  ceFAZolin (ANCEF) IVPB 2g/100 mL premix     2 g 200 mL/hr over 30 Minutes Intravenous  Once 04/06/17 1142 04/06/17 1208   04/05/17 1500  cefTRIAXone (ROCEPHIN) 2 g in dextrose 5 % 50 mL IVPB     2 g 100 mL/hr over 30 Minutes Intravenous Every 24 hours 04/05/17 1415     04/05/17 0800  Ampicillin-Sulbactam (UNASYN) 3 g in sodium chloride 0.9 % 100 mL IVPB     3 g 200 mL/hr over 30 Minutes Intravenous To Endoscopy 04/05/17 0756 04/05/17 1125   04/02/17 0545  vancomycin (VANCOCIN) IVPB 1000 mg/200 mL premix     1,000 mg 200 mL/hr over 60 Minutes Intravenous  Once 04/02/17 0536 04/02/17 0858   04/02/17 0545  piperacillin-tazobactam (ZOSYN) IVPB 3.375 g     3.375 g 100 mL/hr over 30 Minutes Intravenous  Once 04/02/17 0536 04/02/17 0714       Objective: Physical Exam: Vitals:   04/06/17 1456 04/06/17 1600 04/06/17 1705 04/06/17 1807  BP: (!) 150/71 (!) 144/75 120/64 125/66  Pulse: 84 78 77 74  Resp: 16 16 18 16   Temp: 97.6 F (36.4 C) (!) 97.5 F (36.4 C) (!) 97.5 F (36.4 C) (!) 97 F (36.1 C)  TempSrc:      SpO2: 95% 99% 96% 98%  Weight:      Height:        Intake/Output Summary (Last 24 hours) at 04/06/17 1819 Last data filed at 04/06/17 1434  Gross per 24 hour  Intake  5530 ml  Output              105 ml  Net             5425 ml   Filed Weights   04/03/17 0500 04/05/17 0949 04/06/17 1038  Weight: 66.1 kg (145 lb 11.6 oz) 65.8 kg (145 lb) 65.8 kg (145 lb)   General: Alert, Awake and Oriented to Time, Place and Person. Appear in mild distress, affect appropriate Eyes: PERRL, Conjunctiva normal ENT: Oral Mucosa clear moist. Neck: no JVD, no Abnormal Mass Or lumps Cardiovascular: S1 and S2 Present, no Murmur, Peripheral Pulses Present Respiratory: normal respiratory  effort, Bilateral Air entry equal and Decreased, no use of accessory muscle, Clear to Auscultation, no Crackles, no wheezes Abdomen: Bowel Sound present, Soft and RUQ tenderness, no hernia Skin: no redness, no Rash, no induration Extremities: no Pedal edema, no calf tenderness Neurologic: Grossly no focal neuro deficit. Bilaterally Equal motor strength  Data Reviewed: CBC:  Recent Labs Lab 04/02/17 0450 04/02/17 1841 04/04/17 0450 04/05/17 0401 04/06/17 0406  WBC 13.5* 12.7* 8.4 8.1 8.0  NEUTROABS 9.0*  --  5.3  --   --   HGB 13.5 12.8 11.2* 11.4* 11.5*  HCT 39.8 37.6 33.3* 33.7* 34.1*  MCV 88.4 88.1 88.3 86.6 86.3  PLT 290 234 244 244 161   Basic Metabolic Panel:  Recent Labs Lab 04/03/17 0349 04/04/17 0450 04/05/17 0401 04/05/17 1506 04/06/17 0406  NA 141 142 141 139 141  K 4.2 3.9 3.7 3.5 4.1  CL 107 113* 111 109 110  CO2 24 22 23  20* 25  GLUCOSE 105* 155* 136* 265* 237*  BUN 11 5* 6 6 10   CREATININE 0.59 0.53 0.53 0.61 0.61  CALCIUM 8.9 8.2* 8.3* 7.8* 8.6*  MG  --  1.2*  --   --  1.4*    Liver Function Tests:  Recent Labs Lab 04/03/17 0349 04/04/17 0450 04/05/17 0401 04/05/17 1506 04/06/17 0406  AST 350* 117* 59* 61* 38  ALT 596* 322* 239* 228* 190*  ALKPHOS 412* 287* 246* 267* 237*  BILITOT 1.9* 0.8 0.6 0.3 0.5  PROT 6.5 5.7* 5.8* 6.5 6.2*  ALBUMIN 3.4* 3.0* 3.1* 3.3* 3.1*    Recent Labs Lab 04/02/17 0450 04/03/17 0349 04/05/17 0401 04/06/17 0406  LIPASE 7,620* 145* 49 43   No results for input(s): AMMONIA in the last 168 hours. Coagulation Profile: No results for input(s): INR, PROTIME in the last 168 hours. Cardiac Enzymes:  Recent Labs Lab 04/02/17 0450  TROPONINI <0.03   BNP (last 3 results) No results for input(s): PROBNP in the last 8760 hours. CBG:  Recent Labs Lab 04/05/17 2203 04/06/17 0754 04/06/17 1103 04/06/17 1405 04/06/17 1727  GLUCAP 268* 178* 102* 258* 256*   Studies: Dg Cholangiogram Operative  Result  Date: 04/06/2017 CLINICAL DATA:  Cholelithiasis and choledocholithiasis EXAM: INTRAOPERATIVE CHOLANGIOGRAM TECHNIQUE: Cholangiographic images from the C-arm fluoroscopic device were submitted for interpretation post-operatively. Please see the procedural report for the amount of contrast and the fluoroscopy time utilized. COMPARISON:  04/03/2017, 02/03/2017 FINDINGS: Intraoperative cholangiogram performed during the laparoscopic procedure. Cholecystectomy performed. Injection of the residual cystic duct demonstrates mild biliary dilatation without obstruction. Contrast drains easily into the duodenum. Common hepatic duct is incompletely opacified, favor mixing artifact. IMPRESSION: Mild biliary dilatation without obstruction. Electronically Signed   By: Jerilynn Mages.  Shick M.D.   On: 04/06/2017 13:16    Scheduled Meds: . FLUoxetine  40 mg Oral Daily  . fluticasone  2 spray Each Nare Daily  . insulin aspart      . insulin aspart  0-5 Units Subcutaneous QHS  . insulin aspart  0-9 Units Subcutaneous TID WC   Continuous Infusions: . cefTRIAXone (ROCEPHIN)  IV 2 g (04/05/17 1600)  . dextrose 5 % and 0.45 % NaCl with KCl 20 mEq/L 75 mL/hr at 04/06/17 1611   PRN Meds: acetaminophen **OR** acetaminophen, HYDROcodone-acetaminophen, HYDROmorphone (DILAUDID) injection, morphine injection, ondansetron **OR** ondansetron (ZOFRAN) IV  Time spent: 35 minutes  Author: Berle Mull, MD [facility] Pager: [phone] 04/06/2017 6:19 PM  If 7PM-7AM, please contact night-coverage at [email or web address], password [facility]

## 2017-04-06 NOTE — Interval H&P Note (Signed)
History and Physical Interval Note:  04/06/2017 11:37 AM  Tonya Tapia  has presented today for surgery, with the diagnosis of gallstone  Pancreatitis.  The various methods of treatment have been discussed with the patient and family. After consideration of risks, benefits and other options for treatment, the patient has consented to    Procedure(s): LAPAROSCOPIC CHOLECYSTECTOMY (N/A) as a surgical intervention .    The patient's history has been reviewed, patient examined, no change in status, stable for surgery.  I have reviewed the patient's chart and labs.  Questions were answered to the patient's satisfaction.    Armandina Gemma, [place] Surgery Office: [place]

## 2017-04-06 NOTE — Anesthesia Procedure Notes (Signed)
Procedure Name: Intubation Date/Time: 04/06/2017 11:53 AM Performed by: Danley Danker L Patient Re-evaluated:Patient Re-evaluated prior to induction Oxygen Delivery Method: Circle system utilized Preoxygenation: Pre-oxygenation with 100% oxygen Induction Type: IV induction Ventilation: Mask ventilation without difficulty and Oral airway inserted - appropriate to patient size Laryngoscope Size: Miller and 2 Grade View: Grade I Tube type: Oral Tube size: 7.5 mm Number of attempts: 1 Airway Equipment and Method: Stylet Placement Confirmation: ETT inserted through vocal cords under direct vision,  positive ETCO2 and breath sounds checked- equal and bilateral Secured at: 20 cm Tube secured with: Tape Dental Injury: Teeth and Oropharynx as per pre-operative assessment

## 2017-04-06 NOTE — Progress Notes (Addendum)
    Progress Note   Subjective   No complaints. No abdominal pain. Feels well.    Objective  Vital signs in last 24 hours: Temp:  [97.6 F (36.4 C)-98.2 F (36.8 C)] 97.6 F (36.4 C) (10/02 0529) Pulse Rate:  [61-90] 61 (10/02 0529) Resp:  [10-18] 16 (10/02 0529) BP: (130-148)/(65-88) 130/65 (10/02 0529) SpO2:  [95 %-100 %] 97 % (10/02 0529) Weight:  [145 lb (65.8 kg)] 145 lb (65.8 kg) (10/01 0949) Last BM Date: 04/03/17  General: Alert, well-developed, in NAD Heart:  Regular rate and rhythm; no murmurs Chest: Clear to ascultation bilaterally Abdomen:  Soft, nontender and nondistended. Normal bowel sounds, without guarding, and without rebound.   Extremities:  Without edema. Neurologic:  Alert and  oriented x4; grossly normal neurologically. Psych:  Alert and cooperative. Normal mood and affect.  Intake/Output from previous day: 10/01 0701 - 10/02 0700 In: 5280 [P.O.:480; I.V.:4750; IV Piggyback:50] Out: -  Intake/Output this shift: No intake/output data recorded.  Lab Results:  Recent Labs  04/04/17 0450 04/05/17 0401 04/06/17 0406  WBC 8.4 8.1 8.0  HGB 11.2* 11.4* 11.5*  HCT 33.3* 33.7* 34.1*  PLT 244 244 233   BMET  Recent Labs  04/05/17 0401 04/05/17 1506 04/06/17 0406  NA 141 139 141  K 3.7 3.5 4.1  CL 111 109 110  CO2 23 20* 25  GLUCOSE 136* 265* 237*  BUN 6 6 10   CREATININE 0.53 0.61 0.61  CALCIUM 8.3* 7.8* 8.6*   LFT  Recent Labs  04/06/17 0406  PROT 6.2*  ALBUMIN 3.1*  AST 38  ALT 190*  ALKPHOS 237*  BILITOT 0.5    Studies/Results: Dg Ercp Biliary & Pancreatic Ducts  Result Date: 04/05/2017 CLINICAL DATA:  Bile duct stone. EXAM: ERCP TECHNIQUE: Multiple spot images obtained with the fluoroscopic device and submitted for interpretation post-procedure. FLUOROSCOPY TIME:  Fluoroscopy Time:  1 minutes and 27 seconds Number of Acquired Spot Images: 11 COMPARISON:  MRCP 04/03/2017 FINDINGS: Opacification and cannulation of the common  bile duct. Filling defects in common bile duct are compatible with stones. Evidence for a balloon sweep for stone removal. IMPRESSION: Choledocholithiasis and stone removal. These images were submitted for radiologic interpretation only. Please see the procedural report for the amount of contrast and the fluoroscopy time utilized. Electronically Signed   By: Markus Daft M.D.   On: 04/05/2017 11:35    Assessment & Recommendations:  1. 2 CBD stones removed at ERCP yesterday. Stable post ERCP. Advance diet post cholecystectomy per CCS. LFTs trending downward. Repeat LFTs in 6 weeks.   2. Resolved acute biliary pancreatitis. Advance diet post cholecystectomy as tolerated per CCS   3. Cholelithiasis. For cholecystectomy today.   GI signing off. Outpatient GI follow up with Dr. Carlean Purl as needed. Outpatient LFTs in 6 weeks.           LOS: 4 days   Norberto Sorenson T. Fuller Plan  04/06/2017, 8:45 AM

## 2017-04-06 NOTE — Transfer of Care (Signed)
Immediate Anesthesia Transfer of Care Note  Patient: Tonya Tapia  Procedure(s) Performed: LAPAROSCOPIC CHOLECYSTECTOMY WITH INTRAOPERATIVE CHOLANGIOGRAM (N/A )  Patient Location: PACU  Anesthesia Type:General  Level of Consciousness: awake, alert  and oriented  Airway & Oxygen Therapy: Patient Spontanous Breathing and Patient connected to face mask oxygen  Post-op Assessment: Report given to RN and Post -op Vital signs reviewed and stable  Post vital signs: Reviewed and stable  Last Vitals:  Vitals:   04/06/17 0529 04/06/17 0830  BP: 130/65 126/74  Pulse: 61 72  Resp: 16 18  Temp: 36.4 C (!) 36.4 C  SpO2: 97% 100%    Last Pain:  Vitals:   04/06/17 1038  TempSrc:   PainSc: 0-No pain      Patients Stated Pain Goal: 4 (33/58/25 1898)  Complications: No apparent anesthesia complications

## 2017-04-06 NOTE — H&P (View-Only) (Signed)
Day of Surgery    CC:  Abdominal pain  Subjective: S/pERCP this AM, no pain and she feels pretty good  Objective: Vital signs in last 24 hours: Temp:  [97.6 F (36.4 C)-98.2 F (36.8 C)] 97.6 F (36.4 C) (10/01 1122) Pulse Rate:  [70-90] 75 (10/01 1155) Resp:  [10-18] 17 (10/01 1155) BP: (115-145)/(76-88) 139/78 (10/01 1155) SpO2:  [95 %-100 %] 96 % (10/01 1155) Weight:  [65.8 kg (145 lb)] 65.8 kg (145 lb) (10/01 0949) Last BM Date: 04/03/17 1320 PO yesterday 7000 IV Voided x 8 13 liters Positive Afebrile, VSS Alk phos better, AST>ALT improvoing Lipase down to 49 this AM  ERCP earlier today  Intake/Output from previous day: 09/30 0701 - 10/01 0700 In: 8347.5 [P.O.:1320; I.V.:7027.5] Out: -  Intake/Output this shift: Total I/O In: 1000 [I.V.:1000] Out: -   General appearance: alert, cooperative and no distress Resp: clear to auscultation bilaterally GI: soft, denies tenderness now.  + BS  Lab Results:   Recent Labs  04/04/17 0450 04/05/17 0401  WBC 8.4 8.1  HGB 11.2* 11.4*  HCT 33.3* 33.7*  PLT 244 244    BMET  Recent Labs  04/04/17 0450 04/05/17 0401  NA 142 141  K 3.9 3.7  CL 113* 111  CO2 22 23  GLUCOSE 155* 136*  BUN 5* 6  CREATININE 0.53 0.53  CALCIUM 8.2* 8.3*   PT/INR No results for input(s): LABPROT, INR in the last 72 hours.   Recent Labs Lab 04/02/17 0450 04/03/17 0349 04/04/17 0450 04/05/17 0401  AST 696* 350* 117* 59*  ALT 673* 596* 322* 239*  ALKPHOS 443* 412* 287* 246*  BILITOT 1.3* 1.9* 0.8 0.6  PROT 7.2 6.5 5.7* 5.8*  ALBUMIN 3.9 3.4* 3.0* 3.1*     Lipase     Component Value Date/Time   LIPASE 49 04/05/2017 0401     Medications: . FLUoxetine  40 mg Oral Daily  . fluticasone  2 spray Each Nare Daily  . indomethacin  100 mg Rectal Once  . insulin aspart  0-5 Units Subcutaneous QHS  . insulin aspart  0-9 Units Subcutaneous TID WC   . sodium chloride 150 mL/hr at 04/05/17 1301   Anti-infectives    Start      Dose/Rate Route Frequency Ordered Stop   04/05/17 0800  Ampicillin-Sulbactam (UNASYN) 3 g in sodium chloride 0.9 % 100 mL IVPB     3 g 200 mL/hr over 30 Minutes Intravenous To Endoscopy 04/05/17 0756 04/05/17 1125   04/02/17 0545  vancomycin (VANCOCIN) IVPB 1000 mg/200 mL premix     1,000 mg 200 mL/hr over 60 Minutes Intravenous  Once 04/02/17 0536 04/02/17 0858   04/02/17 0545  piperacillin-tazobactam (ZOSYN) IVPB 3.375 g     3.375 g 100 mL/hr over 30 Minutes Intravenous  Once 04/02/17 0536 04/02/17 9211      Assessment/Plan Pancreatitis/pancreatic edema at the head of the pancrease Cholelithiasis/choledocholithiais S/p ERCP with stone removal, and biliary sphincterotomy, 04/05/17, Dr. Lucio Edward Type II diabetes FEN:  NPO/IV fluids HE:[id number] 9/2/-9/29,  Unasyn 04/05/17 DVT:  SCD  Plan:  Clears tonight, NPO after MN and if she has no issues with post ERCP pancreatitis try to get her on for surgery tomorrow if schedule allows.       LOS: 3 days    Kiauna Zywicki 04/05/2017 [phone]

## 2017-04-06 NOTE — Op Note (Signed)
Procedure Note  Pre-operative Diagnosis:  Chronic cholecystitis, cholelithiasis, biliary pancreatitis  Post-operative Diagnosis:  Acute cholecystitis, cholelithiasis, empyema of the gallbladder  Surgeon:  Earnstine Regal, MD, FACS  Assistant:  none   Procedure:  Laparoscopic cholecystectomy with intra-operative cholangiography  Anesthesia:  General  Estimated Blood Loss:  minimal  Drains: 19Fr Blake drain RUQ         Specimen: Gallbladder to pathology  Indications:  Patient is a 66 yo WF with biliary pancreatitis status post ERCP.  For lap cholecystectomy prior to discharge.  Procedure Details:  The patient was seen in the pre-op holding area. The risks, benefits, complications, treatment options, and expected outcomes were previously discussed with the patient. The patient agreed with the proposed plan and has signed the informed consent form.  The patient was brought to the Operating Room, identified as Tonya Tapia and the procedure verified as laparoscopic cholecystectomy with intraoperative cholangiography. A "time out" was completed and the above information confirmed.  The patient was placed in the supine position. Following induction of general anesthesia, the abdomen was prepped and draped in the usual aseptic fashion.  An incision was made in the skin near the umbilicus. The midline fascia was incised and the peritoneal cavity was entered and a Hasson canula was introduced under direct vision.  The Hasson canula was secured with a 0-Vicryl pursestring suture. Pneumoperitoneum was established with carbon dioxide. Additional trocars were introduced under direct vision along the right costal margin in the midline, mid-clavicular line, and anterior axillary line.   The gallbladder was identified and the fundus grasped and retracted cephalad. Dense adhesions were taken down bluntly and the electrocautery was utilized as needed, taking care not to injure any adjacent structures.  The gallbladder was densely adherent to the first portion of the duodenum and was separated with sharp dissection using the scissors.  The gallbladder was entered and purulent fluid was present in the lumen with multiple stones.  The infundibulum was grasped and retracted laterally, exposing the peritoneum overlying the triangle of Calot. The peritoneum was incised and structures exposed with blunt dissection. The cystic duct was clearly identified, bluntly dissected circumferentially, and clipped at the neck of the gallbladder.  An incision was made in the cystic duct and the cholangiogram catheter introduced. The catheter was secured using an ligaclip.  Real-time cholangiography was performed using C-arm fluoroscopy.  There was rapid filling of a normal caliber common bile duct.  There was reflux of contrast into the left and right hepatic ductal systems.  There was free flow distally into the duodenum without filling defect or obstruction.  The catheter was removed from the peritoneal cavity.  The cystic duct was then ligated with surgical clips and divided. The cystic artery was identified, dissected circumferentially, ligated with ligaclips, and divided.  The gallbladder was dissected away from the liver bed using the electrocautery for hemostasis. The gallbladder was completely removed from the liver and placed into an endocatch bag. The gallbladder was removed in the endocatch bag through the umbilical port site and submitted to pathology for review.  The right upper quadrant was irrigated and the gallbladder bed was inspected. Hemostasis was achieved with the electrocautery.  A 19Fr Blake drain was placed in the gallbladder bed and beneath the liver.  Drain was exited through the lateral trocar site and secured to the skin with a 3-0 Nylon suture.  Pneumoperitoneum was released after viewing removal of the trocars with good hemostasis noted. The umbilical wound was irrigated  and the fascia was then  closed with the pursestring suture.  Local anesthetic was infiltrated at all port sites. The skin incisions were closed with 4-0 Monocril subcuticular sutures and steri-strips and dressings were applied.  Instrument, sponge, and needle counts were correct at the conclusion of the case.  The patient was awakened from anesthesia and brought to the recovery room in stable condition.  The patient tolerated the procedure well.   Earnstine Regal, MD, [facility] Office: [phone]

## 2017-04-06 NOTE — Anesthesia Preprocedure Evaluation (Signed)
Anesthesia Evaluation  Patient identified by MRN, date of birth, ID band Patient awake    Reviewed: Allergy & Precautions, NPO status , Patient's Chart, lab work & pertinent test results  Airway Mallampati: II  TM Distance: >3 FB Neck ROM: Full    Dental no notable dental hx.    Pulmonary neg pulmonary ROS,    Pulmonary exam normal breath sounds clear to auscultation       Cardiovascular Normal cardiovascular exam Rhythm:Regular Rate:Normal     Neuro/Psych negative neurological ROS  negative psych ROS   GI/Hepatic negative GI ROS, Neg liver ROS,   Endo/Other  diabetes  Renal/GU negative Renal ROS  negative genitourinary   Musculoskeletal negative musculoskeletal ROS (+)   Abdominal   Peds negative pediatric ROS (+)  Hematology negative hematology ROS (+)   Anesthesia Other Findings   Reproductive/Obstetrics negative OB ROS                             Anesthesia Physical Anesthesia Plan  ASA: II  Anesthesia Plan: General   Post-op Pain Management:    Induction: Intravenous  PONV Risk Score and Plan: 2 and Ondansetron, Treatment may vary due to age or medical condition and Midazolam  Airway Management Planned: Oral ETT  Additional Equipment:   Intra-op Plan:   Post-operative Plan: Extubation in OR  Informed Consent: I have reviewed the patients History and Physical, chart, labs and discussed the procedure including the risks, benefits and alternatives for the proposed anesthesia with the patient or authorized representative who has indicated his/her understanding and acceptance.   Dental advisory given  Plan Discussed with: CRNA and Surgeon  Anesthesia Plan Comments:         Anesthesia Quick Evaluation

## 2017-04-07 DIAGNOSIS — K810 Acute cholecystitis: Secondary | ICD-10-CM

## 2017-04-07 DIAGNOSIS — E119 Type 2 diabetes mellitus without complications: Secondary | ICD-10-CM

## 2017-04-07 DIAGNOSIS — K8044 Calculus of bile duct with chronic cholecystitis without obstruction: Secondary | ICD-10-CM

## 2017-04-07 DIAGNOSIS — Z9049 Acquired absence of other specified parts of digestive tract: Secondary | ICD-10-CM

## 2017-04-07 LAB — CBC WITH DIFFERENTIAL/PLATELET
Basophils Absolute: 0 10*3/uL (ref 0.0–0.1)
Basophils Relative: 0 %
Eosinophils Absolute: 0 10*3/uL (ref 0.0–0.7)
Eosinophils Relative: 0 %
HEMATOCRIT: 32.3 % — AB (ref 36.0–46.0)
HEMOGLOBIN: 10.7 g/dL — AB (ref 12.0–15.0)
LYMPHS ABS: 1.4 10*3/uL (ref 0.7–4.0)
LYMPHS PCT: 9 %
MCH: 29.7 pg (ref 26.0–34.0)
MCHC: 33.1 g/dL (ref 30.0–36.0)
MCV: 89.7 fL (ref 78.0–100.0)
MONO ABS: 1.7 10*3/uL — AB (ref 0.1–1.0)
MONOS PCT: 12 %
NEUTROS ABS: 11.6 10*3/uL — AB (ref 1.7–7.7)
NEUTROS PCT: 79 %
PLATELETS: 243 10*3/uL (ref 150–400)
RBC: 3.6 MIL/uL — ABNORMAL LOW (ref 3.87–5.11)
RDW: 13.8 % (ref 11.5–15.5)
WBC: 14.6 10*3/uL — ABNORMAL HIGH (ref 4.0–10.5)

## 2017-04-07 LAB — MAGNESIUM: MAGNESIUM: 1.5 mg/dL — AB (ref 1.7–2.4)

## 2017-04-07 LAB — COMPREHENSIVE METABOLIC PANEL
ALBUMIN: 3.4 g/dL — AB (ref 3.5–5.0)
ALK PHOS: 206 U/L — AB (ref 38–126)
ALT: 207 U/L — ABNORMAL HIGH (ref 14–54)
ANION GAP: 8 (ref 5–15)
AST: 97 U/L — ABNORMAL HIGH (ref 15–41)
BUN: 7 mg/dL (ref 6–20)
CHLORIDE: 104 mmol/L (ref 101–111)
CO2: 26 mmol/L (ref 22–32)
Calcium: 8.2 mg/dL — ABNORMAL LOW (ref 8.9–10.3)
Creatinine, Ser: 0.61 mg/dL (ref 0.44–1.00)
GFR calc non Af Amer: >60 mL/min (ref >60)
Glucose, Bld: 280 mg/dL — ABNORMAL HIGH (ref 65–99)
Potassium: 4.1 mmol/L (ref 3.5–5.1)
SODIUM: 138 mmol/L (ref 135–145)
Total Bilirubin: 0.4 mg/dL (ref 0.3–1.2)
Total Protein: 6.3 g/dL — ABNORMAL LOW (ref 6.5–8.1)

## 2017-04-07 LAB — GLUCOSE, CAPILLARY
GLUCOSE-CAPILLARY: 220 mg/dL — AB (ref 65–99)
GLUCOSE-CAPILLARY: 200 mg/dL — AB (ref 65–99)
Glucose-Capillary: 264 mg/dL — ABNORMAL HIGH (ref 65–99)
Glucose-Capillary: 175 mg/dL — ABNORMAL HIGH (ref 65–99)

## 2017-04-07 MED ORDER — MAGNESIUM SULFATE 4 GM/100ML IV SOLN
4.0000 g | Freq: Once | INTRAVENOUS | Status: AC
  Administered 2017-04-07: 4 g via INTRAVENOUS
  Filled 2017-04-07: qty 100

## 2017-04-07 MED ORDER — ENOXAPARIN SODIUM 40 MG/0.4ML ~~LOC~~ SOLN
40.0000 mg | SUBCUTANEOUS | Status: DC
  Administered 2017-04-07 – 2017-04-09 (×3): 40 mg via SUBCUTANEOUS
  Filled 2017-04-07 (×3): qty 0.4

## 2017-04-07 MED ORDER — INSULIN GLARGINE 100 UNIT/ML ~~LOC~~ SOLN
10.0000 [IU] | Freq: Every day | SUBCUTANEOUS | Status: DC
  Administered 2017-04-07: 10 [IU] via SUBCUTANEOUS
  Filled 2017-04-07 (×2): qty 0.1

## 2017-04-07 NOTE — Progress Notes (Signed)
[facility]/Trauma Progress Note  1 Day Post-Op   Assessment/Plan Anxiety DM  Pancreatitis/pancreatic edema at the head of the pancrease Cholelithiasis/choledocholithiais - S/p ERCP with stone removal, and biliary sphincterotomy, 04/05/17, Dr. Lucio Edward - S/P Laparoscopic cholecystectomy with intra-operative cholangiography, Dr. Harlow Asa, 10/02  FEN: clears VTE: SCD's, lovenox ID: Rocephin Foley: none Follow up: Dr. Harlow Asa, 2 weeks   DISPO: keep on clears until flatus and nausea resolves. Continue IV abx and anticipate discharge in 2 days. No signs of bilious drainage in JP drain.     LOS: 5 days    Subjective:  CC: abdominal pain, nausea and vomiting  Pt states one episode of vomiting last night and again this am. She is nauseated and took medicine for it. No flatus or BM. No drinking much. Pain worse with deep breath in RUQ.   Objective: Vital signs in last 24 hours: Temp:  [97 F (36.1 C)-98 F (36.7 C)] 97.5 F (36.4 C) (10/03 0516) Pulse Rate:  [68-100] 68 (10/03 0516) Resp:  [14-19] 14 (10/03 0516) BP: (120-163)/(64-93) 138/93 (10/03 0516) SpO2:  [95 %-100 %] 95 % (10/03 0516) Weight:  [145 lb (65.8 kg)] 145 lb (65.8 kg) (10/02 1038) Last BM Date: 04/05/17  Intake/Output from previous day: 10/02 0701 - 10/03 0700 In: 1791.3 [P.O.:360; I.V.:1431.3] Out: 440 [Urine:45; Emesis/NG output:250; Drains:95; Blood:50] Intake/Output this shift: No intake/output data recorded.  PE: Gen:  Alert, NAD, pleasant, cooperative Card:  RRR, no M/G/R heard Pulm:  CTA, no W/R/R, effort normal Abd: Soft, not distended, +BS, incisions C/D/I, drain with minimal serosanguinous drainage, TTP in RUQ, no guarding Skin: no rashes noted, warm and dry   Anti-infectives: Anti-infectives    Start     Dose/Rate Route Frequency Ordered Stop   04/06/17 1145  ceFAZolin (ANCEF) IVPB 2g/100 mL premix     2 g 200 mL/hr over 30 Minutes Intravenous  Once 04/06/17 1142  04/06/17 1208   04/05/17 1500  cefTRIAXone (ROCEPHIN) 2 g in dextrose 5 % 50 mL IVPB     2 g 100 mL/hr over 30 Minutes Intravenous Every 24 hours 04/05/17 1415     04/05/17 0800  Ampicillin-Sulbactam (UNASYN) 3 g in sodium chloride 0.9 % 100 mL IVPB     3 g 200 mL/hr over 30 Minutes Intravenous To Endoscopy 04/05/17 0756 04/05/17 1125   04/02/17 0545  vancomycin (VANCOCIN) IVPB 1000 mg/200 mL premix     1,000 mg 200 mL/hr over 60 Minutes Intravenous  Once 04/02/17 0536 04/02/17 0858   04/02/17 0545  piperacillin-tazobactam (ZOSYN) IVPB 3.375 g     3.375 g 100 mL/hr over 30 Minutes Intravenous  Once 04/02/17 0536 04/02/17 0714      Lab Results:   Recent Labs  04/06/17 0406 04/07/17 0357  WBC 8.0 14.6*  HGB 11.5* 10.7*  HCT 34.1* 32.3*  PLT 233 243   BMET  Recent Labs  04/06/17 0406 04/07/17 0357  NA 141 138  K 4.1 4.1  CL 110 104  CO2 25 26  GLUCOSE 237* 280*  BUN 10 7  CREATININE 0.61 0.61  CALCIUM 8.6* 8.2*   PT/INR No results for input(s): LABPROT, INR in the last 72 hours. CMP     Component Value Date/Time   NA 138 04/07/2017 0357   K 4.1 04/07/2017 0357   CL 104 04/07/2017 0357   CO2 26 04/07/2017 0357   GLUCOSE 280 (H) 04/07/2017 0357   BUN 7 04/07/2017 0357   CREATININE 0.61 04/07/2017 0357  CALCIUM 8.2 (L) 04/07/2017 0357   PROT 6.3 (L) 04/07/2017 0357   ALBUMIN 3.4 (L) 04/07/2017 0357   AST 97 (H) 04/07/2017 0357   ALT 207 (H) 04/07/2017 0357   ALKPHOS 206 (H) 04/07/2017 0357   BILITOT 0.4 04/07/2017 0357   GFRNONAA >60 04/07/2017 0357   GFRAA >60 04/07/2017 0357   Lipase     Component Value Date/Time   LIPASE 43 04/06/2017 0406    Studies/Results: Dg Cholangiogram Operative  Result Date: 04/06/2017 CLINICAL DATA:  Cholelithiasis and choledocholithiasis EXAM: INTRAOPERATIVE CHOLANGIOGRAM TECHNIQUE: Cholangiographic images from the C-arm fluoroscopic device were submitted for interpretation post-operatively. Please see the procedural  report for the amount of contrast and the fluoroscopy time utilized. COMPARISON:  04/03/2017, 02/03/2017 FINDINGS: Intraoperative cholangiogram performed during the laparoscopic procedure. Cholecystectomy performed. Injection of the residual cystic duct demonstrates mild biliary dilatation without obstruction. Contrast drains easily into the duodenum. Common hepatic duct is incompletely opacified, favor mixing artifact. IMPRESSION: Mild biliary dilatation without obstruction. Electronically Signed   By: Jerilynn Mages.  Shick M.D.   On: 04/06/2017 13:16   Dg Ercp Biliary & Pancreatic Ducts  Result Date: 04/05/2017 CLINICAL DATA:  Bile duct stone. EXAM: ERCP TECHNIQUE: Multiple spot images obtained with the fluoroscopic device and submitted for interpretation post-procedure. FLUOROSCOPY TIME:  Fluoroscopy Time:  1 minutes and 27 seconds Number of Acquired Spot Images: 11 COMPARISON:  MRCP 04/03/2017 FINDINGS: Opacification and cannulation of the common bile duct. Filling defects in common bile duct are compatible with stones. Evidence for a balloon sweep for stone removal. IMPRESSION: Choledocholithiasis and stone removal. These images were submitted for radiologic interpretation only. Please see the procedural report for the amount of contrast and the fluoroscopy time utilized. Electronically Signed   By: Markus Daft M.D.   On: 04/05/2017 11:35      Kalman Drape , [facility] 04/07/2017, 8:53 AM Pager: [phone] Consults: [phone] Mon-Fri 7:00 am-4:30 pm Sat-Sun 7:00 am-11:30 am

## 2017-04-07 NOTE — Progress Notes (Signed)
PROGRESS NOTE    Tonya Tapia  PQZ:[id number] DOB: 07/09/1950 DOA: 04/02/2017 PCP: Brunetta Jeans, PA-C    Brief Narrative:  Pt. with type II DM, anxiety, restless leg syndrome; admitted on 04/02/2017, presented with complaint of abdominal pain, was found to have acute pancreatitis as well as pancreatic mass. GI consulted.MRCP was positive, underwent ERCP tolerated it well. Underwent laparoscopic cholecystectomy, seen to have empyema of the gallbladder. Now has JP drain.  Currently further plan is monitor post-op recovery.     Assessment & Plan:   Principal Problem:   Pancreatitis Active Problems:   History of partial thyroidectomy   Type 2 diabetes mellitus without complication, without long-term current use of insulin (HCC)   Abnormal LFTs   Pancreatic mass   Bile duct stone   Hypomagnesemia   Empyema of gallbladder   Choledocholithiasis with chronic cholecystitis  #1 acute gallstone pancreatitis/empyema of gallbladder/choledocholithiasis Patient presented with abdominal pain noted to have a lipase level elevated to 7000. Patient initially placed on bowel rest hydrated with IV fluids placed on antiemetics pain management. CT abdomen and pelvis performed showed a dilated common bile duct and concerning for pancreatic mass. MRCP done was positive for, bile duct stone as well as gallstones. Patient status post ERCP with stone removal 04/05/2017 per Dr. Fuller Plan. Patient subsequently underwent laparoscopic cholecystectomy 04/06/2017 per Dr.Gerkin and noted to have an impacted humeral the gallbladder. Patient status post JP drain placement. Patient currently on empiric IV Rocephin. Surgical pathology negative for malignancy. Patient currently on clear liquids. Per general surgery.  #2 pancreatic mass ruled out. On admission was concerning for pancreatic mass. Patient status post MRCP which was negative for pancreatic mass. CEA was normal. CA-19-9 mildly elevated. Surgical pathology  negative for malignancy.  #3 type 2 diabetes Hemoglobin A1c 6.5. CBGs have ranged from 200-264. Place on Lantus 10 units daily. Sliding scale insulin.  #4 hypomagnesemia Replete.  #5 transaminitis Likely secondary to problem #1. Trending down. Follow.   DVT prophylaxis: Lovenox Code Status: Full Family Communication: Updated patient and husband at bedside. Disposition Plan: Home when medically improved, tolerating oral intake, per general surgery.    Consultants:   Gastroenterology: Dr.Danis III 04/03/2017  Gen. surgery Dr. Windle Guard 04/02/2017  Procedures:   Chest x-ray 04/02/2017  CT abdomen and pelvis 04/02/2017  MRCP 04/03/2017.  Laparoscopic cholecystectomy with intraoperative cholangiography per Dr. Harlow Asa 04/06/2017  ERCP with stone removal by biliary sphincterotomy and balloon extraction per Dr. Fuller Plan 04/05/2017  Antimicrobials:   IV Rocephin 04/05/2017  IV Unasyn 04/05/2017 1 dose   Subjective: Patient with some complaints of nausea and emesis. Patient still with some abdominal pain. No chest pain. No shortness of breath. patient on clear liquids. Patient denies any flatus or bowel movement.  Objective: Vitals:   04/06/17 1807 04/06/17 2044 04/07/17 0516 04/07/17 1412  BP: 125/66 132/64 (!) 138/93 124/61  Pulse: 74 68 68 73  Resp: 16 16 14 16   Temp: (!) 97 F (36.1 C) 98 F (36.7 C) (!) 97.5 F (36.4 C) (!) 97.4 F (36.3 C)  TempSrc:  Oral Oral Oral  SpO2: 98% 98% 95% 95%  Weight:      Height:        Intake/Output Summary (Last 24 hours) at 04/07/17 2012 Last data filed at 04/07/17 0517  Gross per 24 hour  Intake              240 ml  Output  225 ml  Net               15 ml   Filed Weights   04/03/17 0500 04/05/17 0949 04/06/17 1038  Weight: 66.1 kg (145 lb 11.6 oz) 65.8 kg (145 lb) 65.8 kg (145 lb)    Examination:  General exam: Appears calm and comfortable  Respiratory system: Clear to auscultation. Respiratory effort  normal. Cardiovascular system: S1 & S2 heard, RRR. No JVD, murmurs, rubs, gallops or clicks. No pedal edema. Gastrointestinal system: Abdomen is nondistended, soft and some tenderness to palpation in the upper abdomen/epigastric region. Positive bowel sounds. JP drain with serosanguineous drainage noted. Central nervous system: Alert and oriented. No focal neurological deficits. Extremities: Symmetric 5 x 5 power. Skin: No rashes, lesions or ulcers Psychiatry: Judgement and insight appear normal. Mood & affect appropriate.     Data Reviewed: I have personally reviewed following labs and imaging studies  CBC:  Recent Labs Lab 04/02/17 0450 04/02/17 1841 04/04/17 0450 04/05/17 0401 04/06/17 0406 04/07/17 0357  WBC 13.5* 12.7* 8.4 8.1 8.0 14.6*  NEUTROABS 9.0*  --  5.3  --   --  11.6*  HGB 13.5 12.8 11.2* 11.4* 11.5* 10.7*  HCT 39.8 37.6 33.3* 33.7* 34.1* 32.3*  MCV 88.4 88.1 88.3 86.6 86.3 89.7  PLT 290 234 244 244 233 683   Basic Metabolic Panel:  Recent Labs Lab 04/04/17 0450 04/05/17 0401 04/05/17 1506 04/06/17 0406 04/07/17 0357  NA 142 141 139 141 138  K 3.9 3.7 3.5 4.1 4.1  CL 113* 111 109 110 104  CO2 22 23 20* 25 26  GLUCOSE 155* 136* 265* 237* 280*  BUN 5* 6 6 10 7   CREATININE 0.53 0.53 0.61 0.61 0.61  CALCIUM 8.2* 8.3* 7.8* 8.6* 8.2*  MG 1.2*  --   --  1.4* 1.5*   GFR: Estimated Creatinine Clearance: 64.5 mL/min (by C-G formula based on SCr of 0.61 mg/dL). Liver Function Tests:  Recent Labs Lab 04/04/17 0450 04/05/17 0401 04/05/17 1506 04/06/17 0406 04/07/17 0357  AST 117* 59* 61* 38 97*  ALT 322* 239* 228* 190* 207*  ALKPHOS 287* 246* 267* 237* 206*  BILITOT 0.8 0.6 0.3 0.5 0.4  PROT 5.7* 5.8* 6.5 6.2* 6.3*  ALBUMIN 3.0* 3.1* 3.3* 3.1* 3.4*    Recent Labs Lab 04/02/17 0450 04/03/17 0349 04/05/17 0401 04/06/17 0406  LIPASE 7,620* 145* 49 43   No results for input(s): AMMONIA in the last 168 hours. Coagulation Profile: No results for  input(s): INR, PROTIME in the last 168 hours. Cardiac Enzymes:  Recent Labs Lab 04/02/17 0450  TROPONINI <0.03   BNP (last 3 results) No results for input(s): PROBNP in the last 8760 hours. HbA1C:  Recent Labs  04/06/17 0406  HGBA1C 6.5*   CBG:  Recent Labs Lab 04/06/17 1727 04/06/17 2035 04/07/17 0752 04/07/17 1140 04/07/17 1719  GLUCAP 256* 281* 264* 220* 200*   Lipid Profile: No results for input(s): CHOL, HDL, LDLCALC, TRIG, CHOLHDL, LDLDIRECT in the last 72 hours. Thyroid Function Tests: No results for input(s): TSH, T4TOTAL, FREET4, T3FREE, THYROIDAB in the last 72 hours. Anemia Panel: No results for input(s): VITAMINB12, FOLATE, FERRITIN, TIBC, IRON, RETICCTPCT in the last 72 hours. Sepsis Labs: No results for input(s): PROCALCITON, LATICACIDVEN in the last 168 hours.  Recent Results (from the past 240 hour(s))  MRSA PCR Screening     Status: None   Collection Time: 04/06/17  7:07 AM  Result Value Ref Range Status   MRSA  by PCR NEGATIVE NEGATIVE Final    Comment:        The GeneXpert MRSA Assay (FDA approved for NASAL specimens only), is one component of a comprehensive MRSA colonization surveillance program. It is not intended to diagnose MRSA infection nor to guide or monitor treatment for MRSA infections.          Radiology Studies: Dg Cholangiogram Operative  Result Date: 04/06/2017 CLINICAL DATA:  Cholelithiasis and choledocholithiasis EXAM: INTRAOPERATIVE CHOLANGIOGRAM TECHNIQUE: Cholangiographic images from the C-arm fluoroscopic device were submitted for interpretation post-operatively. Please see the procedural report for the amount of contrast and the fluoroscopy time utilized. COMPARISON:  04/03/2017, 02/03/2017 FINDINGS: Intraoperative cholangiogram performed during the laparoscopic procedure. Cholecystectomy performed. Injection of the residual cystic duct demonstrates mild biliary dilatation without obstruction. Contrast drains easily  into the duodenum. Common hepatic duct is incompletely opacified, favor mixing artifact. IMPRESSION: Mild biliary dilatation without obstruction. Electronically Signed   By: Jerilynn Mages.  Shick M.D.   On: 04/06/2017 13:16        Scheduled Meds: . enoxaparin (LOVENOX) injection  40 mg Subcutaneous Q24H  . FLUoxetine  40 mg Oral Daily  . fluticasone  2 spray Each Nare Daily  . insulin aspart  0-5 Units Subcutaneous QHS  . insulin aspart  0-9 Units Subcutaneous TID WC  . insulin glargine  10 Units Subcutaneous Daily   Continuous Infusions: . cefTRIAXone (ROCEPHIN)  IV Stopped (04/07/17 1704)  . dextrose 5 % and 0.45 % NaCl with KCl 20 mEq/L 75 mL/hr at 04/07/17 0553     LOS: 5 days    Time spent: 32 minutes    THOMPSON,DANIEL, MD [facility] Pager [phone]  If 7PM-7AM, please contact night-coverage [email or web address] Password [email or web address] 04/07/2017, 8:12 PM

## 2017-04-07 NOTE — Care Management Important Message (Signed)
Important Message  Patient Details  Name: BOWEN GOYAL MRN: [id number] Date of Birth: 1950-12-05   Medicare Important Message Given:  Yes    Kerin Salen 04/07/2017, 1:19 PMImportant Message  Patient Details  Name: BRIDGITTE FELICETTI MRN: [id number] Date of Birth: 12-02-1950   Medicare Important Message Given:  Yes    Kerin Salen 04/07/2017, 1:19 PM

## 2017-04-08 DIAGNOSIS — Z9889 Other specified postprocedural states: Secondary | ICD-10-CM

## 2017-04-08 LAB — CBC WITH DIFFERENTIAL/PLATELET
BASOS ABS: 0 10*3/uL (ref 0.0–0.1)
BASOS PCT: 0 %
EOS ABS: 0 10*3/uL (ref 0.0–0.7)
EOS PCT: 0 %
HCT: 31.2 % — ABNORMAL LOW (ref 36.0–46.0)
Hemoglobin: 10.6 g/dL — ABNORMAL LOW (ref 12.0–15.0)
Lymphocytes Relative: 12 %
Lymphs Abs: 1.2 10*3/uL (ref 0.7–4.0)
MCH: 29.9 pg (ref 26.0–34.0)
MCHC: 34 g/dL (ref 30.0–36.0)
MCV: 87.9 fL (ref 78.0–100.0)
MONO ABS: 1.1 10*3/uL — AB (ref 0.1–1.0)
Monocytes Relative: 11 %
Neutro Abs: 7.9 10*3/uL — ABNORMAL HIGH (ref 1.7–7.7)
Neutrophils Relative %: 77 %
Platelets: 225 10*3/uL (ref 150–400)
RBC: 3.55 MIL/uL — AB (ref 3.87–5.11)
RDW: 13.4 % (ref 11.5–15.5)
WBC: 10.3 10*3/uL (ref 4.0–10.5)

## 2017-04-08 LAB — COMPREHENSIVE METABOLIC PANEL
ALBUMIN: 3.3 g/dL — AB (ref 3.5–5.0)
ALT: 174 U/L — ABNORMAL HIGH (ref 14–54)
ANION GAP: 7 (ref 5–15)
AST: 69 U/L — AB (ref 15–41)
Alkaline Phosphatase: 193 U/L — ABNORMAL HIGH (ref 38–126)
BILIRUBIN TOTAL: 0.5 mg/dL (ref 0.3–1.2)
BUN: 6 mg/dL (ref 6–20)
CALCIUM: 8.3 mg/dL — AB (ref 8.9–10.3)
CO2: 27 mmol/L (ref 22–32)
CREATININE: 0.52 mg/dL (ref 0.44–1.00)
Chloride: 101 mmol/L (ref 101–111)
GFR calc Af Amer: >60 mL/min (ref >60)
GFR calc non Af Amer: >60 mL/min (ref >60)
GLUCOSE: 220 mg/dL — AB (ref 65–99)
Potassium: 4 mmol/L (ref 3.5–5.1)
Sodium: 135 mmol/L (ref 135–145)
TOTAL PROTEIN: 6.3 g/dL — AB (ref 6.5–8.1)

## 2017-04-08 LAB — MAGNESIUM: MAGNESIUM: 1.9 mg/dL (ref 1.7–2.4)

## 2017-04-08 LAB — GLUCOSE, CAPILLARY
GLUCOSE-CAPILLARY: 203 mg/dL — AB (ref 65–99)
GLUCOSE-CAPILLARY: 149 mg/dL — AB (ref 65–99)
Glucose-Capillary: 200 mg/dL — ABNORMAL HIGH (ref 65–99)
Glucose-Capillary: 124 mg/dL — ABNORMAL HIGH (ref 65–99)

## 2017-04-08 MED ORDER — INSULIN GLARGINE 100 UNIT/ML ~~LOC~~ SOLN
14.0000 [IU] | Freq: Every day | SUBCUTANEOUS | Status: DC
  Administered 2017-04-08 – 2017-04-09 (×2): 14 [IU] via SUBCUTANEOUS
  Filled 2017-04-08 (×2): qty 0.14

## 2017-04-08 NOTE — Progress Notes (Signed)
PROGRESS NOTE    Tonya Tapia  QIH:[id number] DOB: August 05, 1950 DOA: 04/02/2017 PCP: Brunetta Jeans, PA-C    Brief Narrative:  Pt. with type II DM, anxiety, restless leg syndrome; admitted on 04/02/2017, presented with complaint of abdominal pain, was found to have acute pancreatitis as well as pancreatic mass. GI consulted.MRCP was positive, underwent ERCP tolerated it well. Underwent laparoscopic cholecystectomy, seen to have empyema of the gallbladder. Now has JP drain.  Currently further plan is monitor post-op recovery.     Assessment & Plan:   Principal Problem:   Pancreatitis Active Problems:   History of partial thyroidectomy   Type 2 diabetes mellitus without complication, without long-term current use of insulin (HCC)   Abnormal LFTs   Pancreatic mass   Bile duct stone   Hypomagnesemia   Empyema of gallbladder   Choledocholithiasis with chronic cholecystitis  #1 acute gallstone pancreatitis/empyema of gallbladder/choledocholithiasis Patient presented with abdominal pain noted to have a lipase level elevated to 7000. Patient initially placed on bowel rest hydrated with IV fluids placed on antiemetics pain management. CT abdomen and pelvis performed showed a dilated common bile duct and concerning for pancreatic mass. MRCP done was positive for, bile duct stone as well as gallstones. Patient status post ERCP with stone removal 04/05/2017 per Dr. Fuller Plan. Patient subsequently underwent laparoscopic cholecystectomy 04/06/2017 per Dr.Gerkin and noted to have an empyema of the gallbladder. Patient status post JP drain placement. Patient currently on empiric IV Rocephin. Surgical pathology negative for malignancy. Patient tolerating clear liquids. Diet has been advanced to full liquid diet. General surgery.   #2 pancreatic mass ruled out. On admission was concerning for pancreatic mass. Patient status post MRCP which was negative for pancreatic mass. CEA was normal. CA-19-9  mildly elevated. Surgical pathology negative for malignancy.  #3 type 2 diabetes Hemoglobin A1c 6.5. CBGs have ranged from 200-203. Increase Lantus to 14 units daily. Sliding scale insulin.  #4 hypomagnesemia Repleted. Magnesium at 1.9.  #5 transaminitis Likely secondary to problem #1. Trending down. Follow.   DVT prophylaxis: Lovenox Code Status: Full Family Communication: Updated patient. No family at bedside. Disposition Plan: Home when medically improved, tolerating oral intake, per general surgery.    Consultants:   Gastroenterology: Dr.Danis III 04/03/2017  Gen. surgery Dr. Windle Guard 04/02/2017  Procedures:   Chest x-ray 04/02/2017  CT abdomen and pelvis 04/02/2017  MRCP 04/03/2017.  Laparoscopic cholecystectomy with intraoperative cholangiography per Dr. Harlow Asa 04/06/2017  ERCP with stone removal by biliary sphincterotomy and balloon extraction per Dr. Fuller Plan 04/05/2017  Antimicrobials:   IV Rocephin 04/05/2017  IV Unasyn 04/05/2017 1 dose   Subjective: Patient states improvement with nausea and emesis. Patient tolerated clear liquids today. No chest pain. No shortness of breath.   Objective: Vitals:   04/07/17 1412 04/07/17 2100 04/08/17 0508 04/08/17 1514  BP: 124/61 (!) 148/70 138/76 (!) 132/40  Pulse: 73 63 69 85  Resp: 16 14 16 16   Temp: (!) 97.4 F (36.3 C) 97.7 F (36.5 C) 97.9 F (36.6 C) (!) 97.5 F (36.4 C)  TempSrc: Oral Oral Oral Oral  SpO2: 95% 94% 95% 98%  Weight:      Height:        Intake/Output Summary (Last 24 hours) at 04/08/17 1744 Last data filed at 04/08/17 1305  Gross per 24 hour  Intake              720 ml  Output  90 ml  Net              630 ml   Filed Weights   04/03/17 0500 04/05/17 0949 04/06/17 1038  Weight: 66.1 kg (145 lb 11.6 oz) 65.8 kg (145 lb) 65.8 kg (145 lb)    Examination:  General exam: Appears calm and comfortable  Respiratory system: Clear to auscultation. No wheezes, no crackles,  no rhonchi.  Cardiovascular system: S1 & S2 heard, RRR. No JVD, murmurs, rubs, gallops or clicks. No pedal edema. Gastrointestinal system: Abdomen is nondistended, soft and some tenderness to palpation at the incision sites otherwise improved abdominal pain. Positive bowel sounds. JP drain with serosanguineous drainage noted. Central nervous system: Alert and oriented. No focal neurological deficits. Extremities: Symmetric 5 x 5 power. Skin: No rashes, lesions or ulcers Psychiatry: Judgement and insight appear normal. Mood & affect appropriate.     Data Reviewed: I have personally reviewed following labs and imaging studies  CBC:  Recent Labs Lab 04/02/17 0450  04/04/17 0450 04/05/17 0401 04/06/17 0406 04/07/17 0357 04/08/17 0348  WBC 13.5*  < > 8.4 8.1 8.0 14.6* 10.3  NEUTROABS 9.0*  --  5.3  --   --  11.6* 7.9*  HGB 13.5  < > 11.2* 11.4* 11.5* 10.7* 10.6*  HCT 39.8  < > 33.3* 33.7* 34.1* 32.3* 31.2*  MCV 88.4  < > 88.3 86.6 86.3 89.7 87.9  PLT 290  < > 244 244 233 243 225  < > = values in this interval not displayed. Basic Metabolic Panel:  Recent Labs Lab 04/04/17 0450 04/05/17 0401 04/05/17 1506 04/06/17 0406 04/07/17 0357 04/08/17 0348  NA 142 141 139 141 138 135  K 3.9 3.7 3.5 4.1 4.1 4.0  CL 113* 111 109 110 104 101  CO2 22 23 20* 25 26 27   GLUCOSE 155* 136* 265* 237* 280* 220*  BUN 5* 6 6 10 7 6   CREATININE 0.53 0.53 0.61 0.61 0.61 0.52  CALCIUM 8.2* 8.3* 7.8* 8.6* 8.2* 8.3*  MG 1.2*  --   --  1.4* 1.5* 1.9   GFR: Estimated Creatinine Clearance: 64.5 mL/min (by C-G formula based on SCr of 0.52 mg/dL). Liver Function Tests:  Recent Labs Lab 04/05/17 0401 04/05/17 1506 04/06/17 0406 04/07/17 0357 04/08/17 0348  AST 59* 61* 38 97* 69*  ALT 239* 228* 190* 207* 174*  ALKPHOS 246* 267* 237* 206* 193*  BILITOT 0.6 0.3 0.5 0.4 0.5  PROT 5.8* 6.5 6.2* 6.3* 6.3*  ALBUMIN 3.1* 3.3* 3.1* 3.4* 3.3*    Recent Labs Lab 04/02/17 0450 04/03/17 0349  04/05/17 0401 04/06/17 0406  LIPASE 7,620* 145* 49 43   No results for input(s): AMMONIA in the last 168 hours. Coagulation Profile: No results for input(s): INR, PROTIME in the last 168 hours. Cardiac Enzymes:  Recent Labs Lab 04/02/17 0450  TROPONINI <0.03   BNP (last 3 results) No results for input(s): PROBNP in the last 8760 hours. HbA1C:  Recent Labs  04/06/17 0406  HGBA1C 6.5*   CBG:  Recent Labs Lab 04/07/17 1140 04/07/17 1719 04/07/17 2052 04/08/17 0758 04/08/17 1203  GLUCAP 220* 200* 175* 203* 200*   Lipid Profile: No results for input(s): CHOL, HDL, LDLCALC, TRIG, CHOLHDL, LDLDIRECT in the last 72 hours. Thyroid Function Tests: No results for input(s): TSH, T4TOTAL, FREET4, T3FREE, THYROIDAB in the last 72 hours. Anemia Panel: No results for input(s): VITAMINB12, FOLATE, FERRITIN, TIBC, IRON, RETICCTPCT in the last 72 hours. Sepsis Labs: No results for input(s):  PROCALCITON, LATICACIDVEN in the last 168 hours.  Recent Results (from the past 240 hour(s))  MRSA PCR Screening     Status: None   Collection Time: 04/06/17  7:07 AM  Result Value Ref Range Status   MRSA by PCR NEGATIVE NEGATIVE Final    Comment:        The GeneXpert MRSA Assay (FDA approved for NASAL specimens only), is one component of a comprehensive MRSA colonization surveillance program. It is not intended to diagnose MRSA infection nor to guide or monitor treatment for MRSA infections.          Radiology Studies: No results found.      Scheduled Meds: . enoxaparin (LOVENOX) injection  40 mg Subcutaneous Q24H  . FLUoxetine  40 mg Oral Daily  . fluticasone  2 spray Each Nare Daily  . insulin aspart  0-5 Units Subcutaneous QHS  . insulin aspart  0-9 Units Subcutaneous TID WC  . insulin glargine  14 Units Subcutaneous Daily   Continuous Infusions: . cefTRIAXone (ROCEPHIN)  IV 2 g (04/08/17 1518)  . dextrose 5 % and 0.45 % NaCl with KCl 20 mEq/L 75 mL/hr at  04/08/17 1305     LOS: 6 days    Time spent: 67 minutes    Ricky Doan, MD [facility] Pager [phone]  If 7PM-7AM, please contact night-coverage [email or web address] Password [email or web address] 04/08/2017, 5:44 PM

## 2017-04-08 NOTE — Progress Notes (Signed)
[facility]/Trauma Progress Note  2 Days Post-Op   Assessment/Plan Anxiety DM  Pancreatitis/pancreatic edema at the head of the pancrease Cholelithiasis/choledocholithiais - S/p ERCP with stone removal, and biliary sphincterotomy, 04/05/17, Dr. Lucio Edward - S/P Laparoscopic cholecystectomy with intra-operative cholangiography, Dr. Harlow Asa, 10/02  FEN: clears VTE: SCD's, lovenox ID: Rocephin Foley: none Follow up: Dr. Harlow Asa, 2 weeks   DISPO: keep on clears until flatus and nausea resolves. Continue IV abx and anticipate discharge in 1-2 days. No signs of bilious drainage in JP drain.     LOS: 6 days    Subjective:  CC: abdominal soreness  Pt still having intermittent mild nausea but no vomiting. Lots of belching no flatus. Feels better today. Tolerating some clears. Has not had much PO intake. Pain well controlled  Objective: Vital signs in last 24 hours: Temp:  [97.4 F (36.3 C)-97.9 F (36.6 C)] 97.9 F (36.6 C) (10/04 0508) Pulse Rate:  [63-73] 69 (10/04 0508) Resp:  [14-16] 16 (10/04 0508) BP: (124-148)/(61-76) 138/76 (10/04 0508) SpO2:  [94 %-95 %] 95 % (10/04 0508) Last BM Date: 04/08/17  Intake/Output from previous day: 10/03 0701 - 10/04 0700 In: 600 [P.O.:600] Out: 45 [Drains:45] Intake/Output this shift: No intake/output data recorded.  PE: Gen:  Alert, NAD, pleasant, cooperative Card:  RRR, no M/G/R heard Pulm:  CTA, no W/R/R, effort normal Abd: Soft, not distended, +BS, incisions C/D/I, drain with minimal serosanguinous drainage, mild generalized TTP, no guarding Skin: no rashes noted, warm and dry   Anti-infectives: Anti-infectives    Start     Dose/Rate Route Frequency Ordered Stop   04/06/17 1145  ceFAZolin (ANCEF) IVPB 2g/100 mL premix     2 g 200 mL/hr over 30 Minutes Intravenous  Once 04/06/17 1142 04/06/17 1208   04/05/17 1500  cefTRIAXone (ROCEPHIN) 2 g in dextrose 5 % 50 mL IVPB     2 g 100 mL/hr over 30 Minutes  Intravenous Every 24 hours 04/05/17 1415     04/05/17 0800  Ampicillin-Sulbactam (UNASYN) 3 g in sodium chloride 0.9 % 100 mL IVPB     3 g 200 mL/hr over 30 Minutes Intravenous To Endoscopy 04/05/17 0756 04/05/17 1125   04/02/17 0545  vancomycin (VANCOCIN) IVPB 1000 mg/200 mL premix     1,000 mg 200 mL/hr over 60 Minutes Intravenous  Once 04/02/17 0536 04/02/17 0858   04/02/17 0545  piperacillin-tazobactam (ZOSYN) IVPB 3.375 g     3.375 g 100 mL/hr over 30 Minutes Intravenous  Once 04/02/17 0536 04/02/17 0714      Lab Results:   Recent Labs  04/07/17 0357 04/08/17 0348  WBC 14.6* 10.3  HGB 10.7* 10.6*  HCT 32.3* 31.2*  PLT 243 225   BMET  Recent Labs  04/07/17 0357 04/08/17 0348  NA 138 135  K 4.1 4.0  CL 104 101  CO2 26 27  GLUCOSE 280* 220*  BUN 7 6  CREATININE 0.61 0.52  CALCIUM 8.2* 8.3*   PT/INR No results for input(s): LABPROT, INR in the last 72 hours. CMP     Component Value Date/Time   NA 135 04/08/2017 0348   K 4.0 04/08/2017 0348   CL 101 04/08/2017 0348   CO2 27 04/08/2017 0348   GLUCOSE 220 (H) 04/08/2017 0348   BUN 6 04/08/2017 0348   CREATININE 0.52 04/08/2017 0348   CALCIUM 8.3 (L) 04/08/2017 0348   PROT 6.3 (L) 04/08/2017 0348   ALBUMIN 3.3 (L) 04/08/2017 0348   AST 69 (H) 04/08/2017  0348   ALT 174 (H) 04/08/2017 0348   ALKPHOS 193 (H) 04/08/2017 0348   BILITOT 0.5 04/08/2017 0348   GFRNONAA >60 04/08/2017 0348   GFRAA >60 04/08/2017 0348   Lipase     Component Value Date/Time   LIPASE 43 04/06/2017 0406    Studies/Results: Dg Cholangiogram Operative  Result Date: 04/06/2017 CLINICAL DATA:  Cholelithiasis and choledocholithiasis EXAM: INTRAOPERATIVE CHOLANGIOGRAM TECHNIQUE: Cholangiographic images from the C-arm fluoroscopic device were submitted for interpretation post-operatively. Please see the procedural report for the amount of contrast and the fluoroscopy time utilized. COMPARISON:  04/03/2017, 02/03/2017 FINDINGS:  Intraoperative cholangiogram performed during the laparoscopic procedure. Cholecystectomy performed. Injection of the residual cystic duct demonstrates mild biliary dilatation without obstruction. Contrast drains easily into the duodenum. Common hepatic duct is incompletely opacified, favor mixing artifact. IMPRESSION: Mild biliary dilatation without obstruction. Electronically Signed   By: Jerilynn Mages.  Shick M.D.   On: 04/06/2017 13:16      Kalman Drape , [facility] 04/08/2017, 9:05 AM Pager: [phone] Consults: [phone] Mon-Fri 7:00 am-4:30 pm Sat-Sun 7:00 am-11:30 am

## 2017-04-09 ENCOUNTER — Other Ambulatory Visit: Payer: Self-pay

## 2017-04-09 DIAGNOSIS — R7989 Other specified abnormal findings of blood chemistry: Secondary | ICD-10-CM

## 2017-04-09 DIAGNOSIS — R945 Abnormal results of liver function studies: Principal | ICD-10-CM

## 2017-04-09 LAB — COMPREHENSIVE METABOLIC PANEL
ALBUMIN: 2.9 g/dL — AB (ref 3.5–5.0)
ALK PHOS: 154 U/L — AB (ref 38–126)
ALT: 118 U/L — ABNORMAL HIGH (ref 14–54)
ANION GAP: 7 (ref 5–15)
AST: 34 U/L (ref 15–41)
BILIRUBIN TOTAL: 0.4 mg/dL (ref 0.3–1.2)
BUN: <5 mg/dL — ABNORMAL LOW (ref 6–20)
CALCIUM: 8.4 mg/dL — AB (ref 8.9–10.3)
CO2: 28 mmol/L (ref 22–32)
Chloride: 107 mmol/L (ref 101–111)
Creatinine, Ser: 0.6 mg/dL (ref 0.44–1.00)
GLUCOSE: 124 mg/dL — AB (ref 65–99)
POTASSIUM: 3.8 mmol/L (ref 3.5–5.1)
SODIUM: 142 mmol/L (ref 135–145)
TOTAL PROTEIN: 5.6 g/dL — AB (ref 6.5–8.1)

## 2017-04-09 LAB — CBC
HEMATOCRIT: 31.7 % — AB (ref 36.0–46.0)
HEMOGLOBIN: 10.1 g/dL — AB (ref 12.0–15.0)
MCH: 28.9 pg (ref 26.0–34.0)
MCHC: 31.9 g/dL (ref 30.0–36.0)
MCV: 90.6 fL (ref 78.0–100.0)
Platelets: 257 10*3/uL (ref 150–400)
RBC: 3.5 MIL/uL — AB (ref 3.87–5.11)
RDW: 13.6 % (ref 11.5–15.5)
WBC: 7.9 10*3/uL (ref 4.0–10.5)

## 2017-04-09 LAB — GLUCOSE, CAPILLARY
GLUCOSE-CAPILLARY: 233 mg/dL — AB (ref 65–99)
GLUCOSE-CAPILLARY: 113 mg/dL — AB (ref 65–99)

## 2017-04-09 MED ORDER — FLUTICASONE PROPIONATE 50 MCG/ACT NA SUSP: 2.0000 | Freq: Every day | NASAL | 0 refills | Status: DC

## 2017-04-09 MED ORDER — AMOXICILLIN-POT CLAVULANATE 875-125 MG PO TABS
1.0000 | ORAL_TABLET | Freq: Two times a day (BID) | ORAL | Status: DC
  Administered 2017-04-09: 1 via ORAL
  Filled 2017-04-09: qty 1

## 2017-04-09 MED ORDER — AMOXICILLIN-POT CLAVULANATE 875-125 MG PO TABS
1.0000 | ORAL_TABLET | Freq: Two times a day (BID) | ORAL | 0 refills | Status: AC
Start: 2017-04-09 — End: 2017-04-14

## 2017-04-09 MED ORDER — ONDANSETRON 4 MG PO TBDP: 4.0000 mg | ORAL_TABLET | Freq: Four times a day (QID) | ORAL | 0 refills | Status: DC | PRN

## 2017-04-09 MED ORDER — HYDROCODONE-ACETAMINOPHEN 5-325 MG PO TABS: 1.0000 | ORAL_TABLET | ORAL | 0 refills | Status: DC | PRN

## 2017-04-09 NOTE — Care Management Note (Signed)
Case Management Note  Patient Details  Name: Tonya Tapia MRN: [id number] Date of Birth: 1951-04-26  Subjective/Objective:          66 yo admitted with Pancreatitis. Laparoscopic cholecystectomy with intra-operative cholangiography on 10/2.           Action/Plan: From home with spouse.   Expected Discharge Date:   (unknown)               Expected Discharge Plan:  Home/Self Care  In-House Referral:     Discharge planning Services  CM Consult  Post Acute Care Choice:    Choice offered to:     DME Arranged:    DME Agency:     HH Arranged:    HH Agency:     Status of Service:  In process, will continue to follow  If discussed at Long Length of Stay Meetings, dates discussed:    Additional CommentsLynnell Catalan, RN 04/09/2017, 11:39 AM [phone]

## 2017-04-09 NOTE — Discharge Instructions (Signed)
°  [facility] ° °LAPAROSCOPIC SURGERY:  POST-OP INSTRUCTIONS ° °Always review your discharge instruction sheet given to you by the facility where your surgery was performed. ° °A prescription for pain medication may be given to you upon discharge.  Take your pain medication as prescribed.  If narcotic pain medicine is not needed, then you may take acetaminophen (Tylenol) or ibuprofen (Advil) as needed. ° °Take your usually prescribed medications unless otherwise directed. ° °If you need a refill on your pain medication, please contact your pharmacy.  They will contact our office to request authorization. Prescriptions will not be filled after 5 P.M. or on weekends. ° °You should follow a light diet the first few days after arrival home, such as soup and crackers or toast.  Be sure to include plenty of fluids daily. ° °Most patients will experience some swelling and bruising in the area of the incisions.  Ice packs will help.  Swelling and bruising can take several days to resolve.  ° °It is common to experience some constipation after surgery.  Increasing fluid intake and taking a stool softener (such as Colace) will usually help or prevent this problem from occurring.  A mild laxative (Milk of Magnesia or Miralax) should be taken according to package instructions if there has been no bowel movement after 48 hours. ° °You will have steri-strips and a gauze dressing over your incisions.  You may remove the gauze bandage on the second day after surgery, and you may shower at that time.  Leave your steri-strips (small skin tapes) in place directly over the incision.  These strips should remain on the skin for 5-7 days and then be removed.  You may get them wet in the shower and pat them dry. ° °Any sutures or staples will be removed at the office during your follow-up visit. ° °ACTIVITIES:  You may resume regular (light) daily activities beginning the next day - such as daily self-care, walking,  climbing stairs - gradually increasing activities as tolerated.  You may have sexual intercourse when it is comfortable.  Refrain from any heavy lifting or straining until approved by your doctor. ° °You may drive when you are no longer taking prescription pain medication, you can comfortably wear a seatbelt, and you can safely maneuver your car and apply brakes. ° °You should see your doctor in the office for a follow-up appointment approximately 2-3 weeks after your surgery.  Make sure that you call for this appointment within a day or two after you arrive home to insure a convenient appointment time. ° °WHEN TO CALL YOUR DOCTOR: °1. Fever over 101.0 °2. Inability to urinate °3. Continued bleeding from incision °4. Increased pain, redness, or drainage from the incision °5. Increasing abdominal pain ° °The clinic staff is available to answer your questions during regular business hours.  Please don’t hesitate to call and ask to speak to one of the nurses for clinical concerns.  If you have a medical emergency, go to the nearest emergency room or call 911.  A surgeon from [facility] is always on call for the hospital. ° °Keyshawn Hellwig M. Miosotis Wetsel, MD, FACS °[facility] °Office: [phone] °Toll Free:  [phone] °FAX [phone] ° °Website: [email or web address] °

## 2017-04-09 NOTE — Discharge Summary (Signed)
Physician Discharge Summary  Tonya Tapia JSH:[id number] DOB: 1951-01-01 DOA: 04/02/2017  PCP: Brunetta Jeans, PA-C  Admit date: 04/02/2017 Discharge date: 04/09/2017  Time spent: 60 minutes  Recommendations for Outpatient Follow-up:  1. Follow-up with Brunetta Jeans, PA-C in 2 weeks. On follow-up patient will need a comprehensive metabolic profile done to follow-up on electrolytes, renal function and LFTs. 2. Follow-up with [facility] in 2 weeks for wound recheck.   Discharge Diagnoses:  Principal Problem:   Acute gallstone pancreatitis Active Problems:   Empyema of gallbladder   Choledocholithiasis with chronic cholecystitis   History of partial thyroidectomy   Type 2 diabetes mellitus without complication, without long-term current use of insulin (HCC)   Abnormal LFTs   Bile duct stone   Hypomagnesemia   Discharge Condition: Stable and improved  Diet recommendation: Carb modified diet  Filed Weights   04/03/17 0500 04/05/17 0949 04/06/17 1038  Weight: 66.1 kg (145 lb 11.6 oz) 65.8 kg (145 lb) 65.8 kg (145 lb)    History of present illness:  Per Dr.Chiu Tonya Tapia is a 66 y.o. female with medical history significant of diabetes, anxiety who  presented with progressively worsening epigastric discomfort described as "heartburn" that had been noticed for the past several months. Historically, symptoms were relieved with Alka-Seltzer. On the evening of admission at approximately 6:00 PM, patient noted marketed epigastric burning sensation that did not improve with Alka-Seltzer. Symptoms started after eating dinner. Patient ultimately presented to [facility] or patient was noted have a presenting lipase of over 7000 with elevated liver function tests including an elevated alkaline phosphatase. Patient was found to have evidence of a thickened gallbladder. General surgery was consulted. Hospitalist service was consulted for  admission.  Hospital Course:  #1 acute gallstone pancreatitis/empyema of gallbladder/choledocholithiasis Patient presented with abdominal pain noted to have a lipase level elevated to 7620. Patient initially placed on bowel rest hydrated with IV fluids placed on antiemetics and pain management. CT abdomen and pelvis performed showed a dilated common bile duct and concerning for pancreatic mass. MRCP done was positive for, bile duct stone as well as gallstones. Patient status post ERCP with stone removal 04/05/2017 per Dr. Fuller Plan. Patient subsequently underwent laparoscopic cholecystectomy 04/06/2017 per Dr.Gerkin and noted to have an empyema of the gallbladder. Patient was placed empirically on IV Rocephin perioperatively which was continued during the hospitalization. LFTs trended down as well as lipase levels which were down to 43 from 7620 by day of discharge. Patient status post JP drain placement which was discontinued on day of discharge by general surgery. Patient improved clinically was initially started on clear liquids which she tolerated and diet slowly advanced to a regular diet which she tolerated.. Surgical pathology negative for malignancy. Patient will be discharged home on 5 more days of oral Augmentin to complete a course of antibiotic treatment. Patient will follow-up with general surgery in the outpatient setting.   #2 pancreatic mass ruled out. On admission there was concerning for pancreatic mass. Patient status post MRCP which was negative for pancreatic mass. CEA was normal. CA-19-9 mildly elevated. Surgical pathology negative for malignancy.  #3 type 2 diabetes Hemoglobin A1c 6.5. CBGs during the hospitalization ranging in the 200s and a such patient was placed on Lantus 14 units daily as well as sliding scale insulin with better blood sugar control. Patient's oral hypoglycemic agents were held and will be resumed on discharge.   #4 hypomagnesemia Repleted. Magnesium at  1.9.  #5  transaminitis Likely secondary to problem #1. Trended down during the hospitalization after treatment for problem #1. Outpatient follow-up.  Procedures:  Chest x-ray 04/02/2017  CT abdomen and pelvis 04/02/2017  MRCP 04/03/2017.  Laparoscopic cholecystectomy with intraoperative cholangiography per Dr. Gerrit Friends 04/06/2017  ERCP with stone removal by biliary sphincterotomy and balloon extraction per Dr. Russella Dar 04/05/2017   Consultations:  Gastroenterology: Dr.Danis III 04/03/2017  Gen. surgery Dr. Doylene Canard 04/02/2017   Discharge Exam: Vitals:   04/08/17 2143 04/09/17 0558  BP: 134/71 140/67  Pulse: 65 (!) 59  Resp: 16 16  Temp: 98.3 F (36.8 C) 97.6 F (36.4 C)  SpO2: 97% 97%    General: NAD Cardiovascular: RRR Respiratory: CTAB GI:  ABDOMEN IS SOFT, DECREASED TENDERNESS TO PALPATION, INCISION SITES CLEAN DRY AND INTACT, POSITIVE BOWEL SOUNDS.  Discharge Instructions   Discharge Instructions    Diet Carb Modified    Complete by:  As directed    Soft diet.   Increase activity slowly    Complete by:  As directed      Current Discharge Medication List    START taking these medications   Details  amoxicillin-clavulanate (AUGMENTIN) 875-125 MG tablet Take 1 tablet by mouth every 12 (twelve) hours. Take for 5 days then stop. Qty: 10 tablet, Refills: 0    HYDROcodone-acetaminophen (NORCO/VICODIN) 5-325 MG tablet Take 1-2 tablets by mouth every 4 (four) hours as needed for moderate pain. Qty: 15 tablet, Refills: 0    ondansetron (ZOFRAN-ODT) 4 MG disintegrating tablet Take 1 tablet (4 mg total) by mouth every 6 (six) hours as needed for nausea. Qty: 20 tablet, Refills: 0      CONTINUE these medications which have CHANGED   Details  fluticasone (FLONASE) 50 MCG/ACT nasal spray Place 2 sprays into both nostrils daily. Qty: 16 g, Refills: 0      CONTINUE these medications which have NOT CHANGED   Details  atorvastatin (LIPITOR) 10 MG tablet TAKE 1  TABLET(10 MG) BY MOUTH AT BEDTIME Qty: 90 tablet, Refills: 1    Calcium Carb-Cholecalciferol (CALCIUM-VITAMIN D) 600-400 MG-UNIT TABS Take 2 each by mouth daily.    FLUoxetine (PROZAC) 40 MG capsule TAKE 1 CAPSULE(40 MG) BY MOUTH DAILY Qty: 90 capsule, Refills: 0    metFORMIN (GLUCOPHAGE-XR) 500 MG 24 hr tablet TAKE 2 TABLETS(1000 MG) BY MOUTH TWICE DAILY WITH A MEAL Qty: 360 tablet, Refills: 1    OVER THE COUNTER MEDICATION Multiple Vitamins-Minerals-Take 1 tablet by mouth daily.    ramipril (ALTACE) 2.5 MG capsule TAKE 1 CAPSULE(2.5 MG) BY MOUTH DAILY Qty: 90 capsule, Refills: 0    VICTOZA 18 MG/3ML SOPN ADMINISTER 1.2 MG UNDER THE SKIN DAILY Qty: 6 mL, Refills: 3    ALPRAZolam (XANAX) 0.25 MG tablet TAKE 1 TABLET BY MOUTH EVERY 12 HOURS AS NEEDED FOR ANXIETY Qty: 30 tablet, Refills: 0    Blood Glucose Monitoring Suppl (TRUETRACK SMART SYSTEM) KIT 1 kit by Does not apply route daily. Dx: E11.9 Qty: 1 each, Refills: 0   Associated Diagnoses: Type 2 diabetes mellitus without complication, without long-term current use of insulin (HCC)    glucose blood (TRUETRACK TEST) test strip Use as instructed daily Dx: E11.9 Qty: 100 each, Refills: 12   Associated Diagnoses: Type 2 diabetes mellitus without complication, without long-term current use of insulin (HCC)    Insulin Pen Needle 32G X 4 MM MISC 1 Stick by Does not apply route daily. Dx: E11.9 Qty: 100 each, Refills: 3       No Known  Allergies Follow-up [facility], [place]. Schedule an appointment as soon as possible for a visit in 2 week(s).   Specialty:  General Surgery Why:  for wound check Contact information: [address] [place] [phone]       Brunetta Jeans, [place]. Schedule an appointment as soon as possible for a visit in 2 week(s).   Specialty:  Family Medicine Contact information: [address] [place]  [address] [phone]            The results of significant diagnostics from this hospitalization (including imaging, microbiology, ancillary and laboratory) are listed below for reference.    Significant Diagnostic Studies: Dg Chest 2 View  Result Date: 04/02/2017 CLINICAL DATA:  Chest pain. EXAM: CHEST  2 VIEW COMPARISON:  09/24/2008 FINDINGS: The cardiomediastinal contours are normal. The lungs are clear. Pulmonary vasculature is normal. No consolidation, pleural effusion, or pneumothorax. No acute osseous abnormalities are seen. Right thyroidectomy surgical clips noted at the thoracic inlet. IMPRESSION: No acute pulmonary process. Electronically Signed   By: Jeb Levering M.D.   On: 04/02/2017 06:03   Dg Cholangiogram Operative  Result Date: 04/06/2017 CLINICAL DATA:  Cholelithiasis and choledocholithiasis EXAM: INTRAOPERATIVE CHOLANGIOGRAM TECHNIQUE: Cholangiographic images from the C-arm fluoroscopic device were submitted for interpretation post-operatively. Please see the procedural report for the amount of contrast and the fluoroscopy time utilized. COMPARISON:  04/03/2017, 02/03/2017 FINDINGS: Intraoperative cholangiogram performed during the laparoscopic procedure. Cholecystectomy performed. Injection of the residual cystic duct demonstrates mild biliary dilatation without obstruction. Contrast drains easily into the duodenum. Common hepatic duct is incompletely opacified, favor mixing artifact. IMPRESSION: Mild biliary dilatation without obstruction. Electronically Signed   By: Jerilynn Mages.  Shick M.D.   On: 04/06/2017 13:16   Ct Abdomen Pelvis W Contrast  Result Date: 04/02/2017 CLINICAL DATA:  Epigastric abdominal pain. EXAM: CT ABDOMEN AND PELVIS WITH CONTRAST TECHNIQUE: Multidetector CT imaging of the abdomen and pelvis was performed using the standard protocol following bolus administration of intravenous contrast. CONTRAST:  176m ISOVUE-300 IOPAMIDOL (ISOVUE-300) INJECTION 61%  COMPARISON:  None. FINDINGS: Lower chest: The lung bases are clear. Hepatobiliary: Intra and extrahepatic biliary ductal dilatation, common bile duct dilated to the duodenal insertion and measures 11 mm. The gallbladder is distended with calcifications at the fundus, stones versus wall calcification. Question mild pericholecystic soft tissue stranding. No discrete focal hepatic lesion. Pancreas: Questionable ill-defined low density at the pancreatic head, best appreciated on delayed phase imaging image 24 series 7. Mild proximal pancreatic ductal dilatation measures 4 mm. Mild indistinctness of peripancreatic fat planes. No peripancreatic fluid collection. Spleen: Normal in size without focal abnormality. Adrenals/Urinary Tract: No adrenal nodule. No hydronephrosis or perinephric edema. Homogeneous renal enhancement with symmetric excretion on delayed phase imaging. Urinary bladder is physiologically distended without wall thickening. Stomach/Bowel: Enteric contrast in the distal esophagus with small hiatal hernia. Stomach is mildly distended. No small bowel obstruction, inflammation or wall thickening. Proximal appendix is normal, distal appendix obscured by adjacent small bowel. Small to moderate colonic stool burden without colonic wall thickening. Minimal sigmoid diverticulosis without acute inflammation. Vascular/Lymphatic: Mild aortic atherosclerosis without aneurysm. Questionable portacaval node measuring 9 mm. No retroperitoneal adenopathy. Reproductive: Uterus and bilateral adnexa are unremarkable. Other: No ascites or free air.  No intra-abdominal abscess. Musculoskeletal: Degenerative change in the lower lumbar spine. There are no acute or suspicious osseous abnormalities. IMPRESSION: 1. Findings suspicious for ill-defined pancreatic head mass. Intra and extrahepatic biliary ductal dilatation  and mild proximal pancreatic ductal dilatation. Gallbladder distention with probable wall calcification about the  fundus. Recommend pancreatic protocol MRI/MRCP. 2. Probable portacaval node. 3.  Aortic Atherosclerosis (ICD10-I70.0). Electronically Signed   By: Jeb Levering M.D.   On: 04/02/2017 06:56   Dg Ercp Biliary & Pancreatic Ducts  Result Date: 04/05/2017 CLINICAL DATA:  Bile duct stone. EXAM: ERCP TECHNIQUE: Multiple spot images obtained with the fluoroscopic device and submitted for interpretation post-procedure. FLUOROSCOPY TIME:  Fluoroscopy Time:  1 minutes and 27 seconds Number of Acquired Spot Images: 11 COMPARISON:  MRCP 04/03/2017 FINDINGS: Opacification and cannulation of the common bile duct. Filling defects in common bile duct are compatible with stones. Evidence for a balloon sweep for stone removal. IMPRESSION: Choledocholithiasis and stone removal. These images were submitted for radiologic interpretation only. Please see the procedural report for the amount of contrast and the fluoroscopy time utilized. Electronically Signed   By: Markus Daft M.D.   On: 04/05/2017 11:35   Mr Abdomen Mrcp Moise Boring Contast  Result Date: 04/03/2017 CLINICAL DATA:  Pancreatitis. EXAM: MRI ABDOMEN WITHOUT AND WITH CONTRAST (INCLUDING MRCP) TECHNIQUE: Multiplanar multisequence MR imaging of the abdomen was performed both before and after the administration of intravenous contrast. Heavily T2-weighted images of the biliary and pancreatic ducts were obtained, and three-dimensional MRCP images were rendered by post processing. CONTRAST:  56m MULTIHANCE GADOBENATE DIMEGLUMINE 529 MG/ML IV SOLN COMPARISON:  CT 04/02/2017 FINDINGS: Lower chest: No acute findings. Hepatobiliary: No suspicious liver abnormality identified. Mild hepatic steatosis. No enhancing liver abnormalities. The gallbladder is distended. Multiple stones are identified within the gallbladder. A large stone within the neck of the gallbladder measures 1.5 cm peer the common bile duct measure 1 cm in maximum diameter. There is mild intrahepatic biliary  dilatation. Two stones within the common bile duct are noted. The largest is identified distally measuring 6 mm. Pancreas: Mild edema involving the head of pancreas is noted. No pancreatic mass. No pancreatic ductal dilatation. Spleen:  Within normal limits in size and appearance. Adrenals/Urinary Tract: The adrenal glands are normal. No kidney mass identified. No evidence of hydronephrosis. Stomach/Bowel: Visualized portions within the abdomen are unremarkable. Vascular/Lymphatic: No pathologically enlarged lymph nodes identified. No abdominal aortic aneurysm demonstrated. Other:  None. Musculoskeletal: No suspicious bone lesions identified. IMPRESSION: 1. Gallstones and choledocholithiasis. 2 stones are identified within the common bile duct resulting in CBD and intrahepatic biliary dilatation. 2. Distended gallbladder with mild gallbladder wall thickening. Cannot rule out cholecystitis. 3. Mild inflammation of the head of pancreas compatible with focal pancreatitis. Electronically Signed   By: TKerby MoorsM.D.   On: 04/03/2017 19:13    Microbiology: Recent Results (from the past 240 hour(s))  MRSA PCR Screening     Status: None   Collection Time: 04/06/17  7:07 AM  Result Value Ref Range Status   MRSA by PCR NEGATIVE NEGATIVE Final    Comment:        The GeneXpert MRSA Assay (FDA approved for NASAL specimens only), is one component of a comprehensive MRSA colonization surveillance program. It is not intended to diagnose MRSA infection nor to guide or monitor treatment for MRSA infections.      Labs: Basic Metabolic Panel:  Recent Labs Lab 04/04/17 0450  04/05/17 1506 04/06/17 0406 04/07/17 0357 04/08/17 0348 04/09/17 0342  NA 142  < > 139 141 138 135 142  K 3.9  < > 3.5 4.1 4.1 4.0 3.8  CL 113*  < > 109 110 104 101  107  CO2 22  < > 20* '25 26 27 28  '$ GLUCOSE 155*  < > 265* 237* 280* 220* 124*  BUN 5*  < > '6 10 7 6 '$ <5*  CREATININE 0.53  < > 0.61 0.61 0.61 0.52 0.60  CALCIUM  8.2*  < > 7.8* 8.6* 8.2* 8.3* 8.4*  MG 1.2*  --   --  1.4* 1.5* 1.9  --   < > = values in this interval not displayed. Liver Function Tests:  Recent Labs Lab 04/05/17 1506 04/06/17 0406 04/07/17 0357 04/08/17 0348 04/09/17 0342  AST 61* 38 97* 69* 34  ALT 228* 190* 207* 174* 118*  ALKPHOS 267* 237* 206* 193* 154*  BILITOT 0.3 0.5 0.4 0.5 0.4  PROT 6.5 6.2* 6.3* 6.3* 5.6*  ALBUMIN 3.3* 3.1* 3.4* 3.3* 2.9*    Recent Labs Lab 04/03/17 0349 04/05/17 0401 04/06/17 0406  LIPASE 145* 49 43   No results for input(s): AMMONIA in the last 168 hours. CBC:  Recent Labs Lab 04/04/17 0450 04/05/17 0401 04/06/17 0406 04/07/17 0357 04/08/17 0348 04/09/17 0342  WBC 8.4 8.1 8.0 14.6* 10.3 7.9  NEUTROABS 5.3  --   --  11.6* 7.9*  --   HGB 11.2* 11.4* 11.5* 10.7* 10.6* 10.1*  HCT 33.3* 33.7* 34.1* 32.3* 31.2* 31.7*  MCV 88.3 86.6 86.3 89.7 87.9 90.6  PLT 244 244 233 243 225 257   Cardiac Enzymes: No results for input(s): CKTOTAL, CKMB, CKMBINDEX, TROPONINI in the last 168 hours. BNP: BNP (last 3 results) No results for input(s): BNP in the last 8760 hours.  ProBNP (last 3 results) No results for input(s): PROBNP in the last 8760 hours.  CBG:  Recent Labs Lab 04/08/17 1203 04/08/17 1745 04/08/17 2136 04/09/17 0832 04/09/17 1247  GLUCAP 200* 149* 124* 233* 113*       Signed:  THOMPSON,DANIEL MD.  [facility] 04/09/2017, 2:05 PM

## 2017-04-09 NOTE — Progress Notes (Signed)
Pt verbalized understandings of dc instructions  And scripts. D/C home with a family mamber

## 2017-04-09 NOTE — Final Consult Note (Signed)
Consultant Final Sign-Off Note    Assessment/Final recommendations  Tonya Tapia is a 66 y.o. female followed by me for acute cholecystitis, cholelithiasis, biliary pancreatitis.   Wound care (if applicable): Leave steri-strips 5 days.  May shower.   Diet at discharge: ad lib   Activity at discharge: ad lib   Follow-up appointment:  [facility] office 2 weeks - call [phone]   Pending results:  Unresulted Labs    None       Medication recommendations:   Other recommendations:    Thank you for allowing [place] to participate in the care of your patient!  Please consult [place] again if you have further needs for your patient.  Classie Weng M 04/09/2017 7:47 AM    Subjective     Objective  Vital signs in last 24 hours: Temp:  [97.5 F (36.4 C)-98.3 F (36.8 C)] 97.6 F (36.4 C) (10/05 0558) Pulse Rate:  [59-85] 59 (10/05 0558) Resp:  [16] 16 (10/05 0558) BP: (132-140)/(40-71) 140/67 (10/05 0558) SpO2:  [97 %-98 %] 97 % (10/05 0558)  General: Abd - soft without distension; JP drain removed and dressing placed   Pertinent labs and Studies:  Recent Labs  04/07/17 0357 04/08/17 0348 04/09/17 0342  WBC 14.6* 10.3 7.9  HGB 10.7* 10.6* 10.1*  HCT 32.3* 31.2* 31.7*   BMET  Recent Labs  04/08/17 0348 04/09/17 0342  NA 135 142  K 4.0 3.8  CL 101 107  CO2 27 28  GLUCOSE 220* 124*  BUN 6 <5*  CREATININE 0.52 0.60  CALCIUM 8.3* 8.4*   No results for input(s): LABURIN in the last 72 hours. Results for orders placed or performed during the hospital encounter of 04/02/17  MRSA PCR Screening     Status: None   Collection Time: 04/06/17  7:07 AM  Result Value Ref Range Status   MRSA by PCR NEGATIVE NEGATIVE Final    Comment:        The GeneXpert MRSA Assay (FDA approved for NASAL specimens only), is one component of a comprehensive MRSA colonization surveillance program. It is not intended to diagnose MRSA infection nor to guide or monitor treatment  for MRSA infections.     Imaging: No results found.

## 2017-04-12 ENCOUNTER — Telehealth: Payer: Self-pay

## 2017-04-12 NOTE — Telephone Encounter (Signed)
Unable to reach patient at time of TCM Call. Left message for patient to return call when available.  

## 2017-04-13 NOTE — Telephone Encounter (Signed)
Transition Care Management Follow-up Telephone Call   Date discharged? 04/09/17   How have you been since you were released from the hospital? "taking a while to get my strength back, but I'm doing good". Patient reports eating well, occasional nausea.    Do you understand why you were in the hospital? yes   Do you understand the discharge instructions? yes   Where were you discharged to? Home. Lives with husband.   Items Reviewed:  Medications reviewed: yes  Allergies reviewed: yes  Dietary changes reviewed: yes  Referrals reviewed: yes   Functional Questionnaire:   Activities of Daily Living (ADLs):   She states they are independent in the following: ambulation, bathing and hygiene, feeding, continence, grooming, toileting and dressing States they require assistance with the following: None.    Any transportation issues/concerns?: no   Any patient concerns? no   Confirmed importance and date/time of follow-up visits scheduled yes  Provider Appointment booked with PCP on 04/22/17 @ 11 am.   Confirmed with patient if condition begins to worsen call PCP or go to the ER.  Patient was given the office number and encouraged to call back with question or concerns.  : yes

## 2017-04-22 ENCOUNTER — Ambulatory Visit (INDEPENDENT_AMBULATORY_CARE_PROVIDER_SITE_OTHER): Payer: Medicare Other | Admitting: Physician Assistant

## 2017-04-22 ENCOUNTER — Encounter: Payer: Self-pay | Admitting: Physician Assistant

## 2017-04-22 VITALS — BP 110/60 | HR 78 | Temp 97.7°F | Resp 14 | Ht 64.0 in | Wt 142.0 lb

## 2017-04-22 DIAGNOSIS — K851 Biliary acute pancreatitis without necrosis or infection: Secondary | ICD-10-CM | POA: Diagnosis not present

## 2017-04-22 DIAGNOSIS — E1169 Type 2 diabetes mellitus with other specified complication: Secondary | ICD-10-CM | POA: Diagnosis not present

## 2017-04-22 DIAGNOSIS — Z23 Encounter for immunization: Secondary | ICD-10-CM | POA: Diagnosis not present

## 2017-04-22 DIAGNOSIS — E785 Hyperlipidemia, unspecified: Secondary | ICD-10-CM | POA: Diagnosis not present

## 2017-04-22 DIAGNOSIS — Z9049 Acquired absence of other specified parts of digestive tract: Secondary | ICD-10-CM

## 2017-04-22 LAB — LIPID PANEL
Cholesterol: 192 mg/dL (ref 0–200)
HDL: 62.3 mg/dL (ref >39.00)
LDL CALC: 104 mg/dL — AB (ref 0–99)
NonHDL: 129.67
Total CHOL/HDL Ratio: 3
Triglycerides: 127 mg/dL (ref 0.0–149.0)
VLDL: 25.4 mg/dL (ref 0.0–40.0)

## 2017-04-22 LAB — COMPREHENSIVE METABOLIC PANEL
ALK PHOS: 106 U/L (ref 39–117)
ALT: 13 U/L (ref 0–35)
AST: 14 U/L (ref 0–37)
Albumin: 4.2 g/dL (ref 3.5–5.2)
BUN: 17 mg/dL (ref 6–23)
CHLORIDE: 100 meq/L (ref 96–112)
CO2: 30 mEq/L (ref 19–32)
Calcium: 10.5 mg/dL (ref 8.4–10.5)
Creatinine, Ser: 0.75 mg/dL (ref 0.40–1.20)
GFR: 82.09 mL/min (ref >60.00)
GLUCOSE: 123 mg/dL — AB (ref 70–99)
POTASSIUM: 4.2 meq/L (ref 3.5–5.1)
SODIUM: 139 meq/L (ref 135–145)
TOTAL PROTEIN: 7 g/dL (ref 6.0–8.3)
Total Bilirubin: 0.4 mg/dL (ref 0.2–1.2)

## 2017-04-22 NOTE — Progress Notes (Signed)
Pre visit review using our clinic review tool, if applicable. No additional management support is needed unless otherwise documented below in the visit note. 

## 2017-04-22 NOTE — Progress Notes (Signed)
Patient presents to clinic today for TCM visit. Patient presented to ER on 04/02/17 with complaints of epigastric pain, nausea and vomiting. Labs obtained revealing lipase at 7000 with elevated liver function and ALK Phos.  US obtained revealing thickened gallbladder -- general surgery consulted and recommended admission. CT abdomen pelvis performed showed a dilated common bile duct and concerning for pancreatic mass. MRCP positive for bile duct stone and gallstones. ERCP with stone removal and laparoscopic cholecystectomy 04/06/17. Noted empyema of gallbladder. Patient was started on IV Rocephin perioperatively. JP drain placed. Labs returned to more normal range. JP drain discontinued the day of discharge. Patient discharged on 5 more days of Augmentin. Was instructed to follow-up with General Surgery and PCP.  Since discharge, patient endorses doing very well overall. Is taking all medications as directed. Denies abdominal pain, nausea/vomiting. Endorses regular bladder/bowel habits. Is keeping well-hydrated. Has follow-up with General Surgery this afternoon. Fasting sugars are averaging 100-150.  Patient due for Pneumovax and flu shot. Is also due for foot exam. Eye examination and other HM parameters are up-to-date.  Past Medical History:  Diagnosis Date  . Anxiety   . Diabetes mellitus without complication (HCC)    per pt. borderline diabetic  . Fibrocystic disease of breast   . Mood swings     Current Outpatient Prescriptions on File Prior to Visit  Medication Sig Dispense Refill  . ALPRAZolam (XANAX) 0.25 MG tablet TAKE 1 TABLET BY MOUTH EVERY 12 HOURS AS NEEDED FOR ANXIETY 30 tablet 0  . atorvastatin (LIPITOR) 10 MG tablet TAKE 1 TABLET(10 MG) BY MOUTH AT BEDTIME (Patient taking differently: Take 10 mg by mouth every morning. TAKE 1 TABLET(10 MG) BY MOUTH AT BEDTIME) 90 tablet 1  . Blood Glucose Monitoring Suppl ([place]) KIT 1 kit by Does not apply route daily. Dx: E11.9  1 each 0  . Calcium Carb-Cholecalciferol (CALCIUM-VITAMIN D) 600-400 MG-UNIT TABS Take 2 each by mouth daily.    Marland Kitchen FLUoxetine (PROZAC) 40 MG capsule TAKE 1 CAPSULE(40 MG) BY MOUTH DAILY 90 capsule 0  . glucose blood (TRUETRACK TEST) test strip Use as instructed daily Dx: E11.9 100 each 12  . Insulin Pen Needle 32G X 4 MM MISC 1 Stick by Does not apply route daily. Dx: E11.9 100 each 3  . metFORMIN (GLUCOPHAGE-XR) 500 MG 24 hr tablet TAKE 2 TABLETS(1000 MG) BY MOUTH TWICE DAILY WITH A MEAL 360 tablet 1  . ondansetron (ZOFRAN-ODT) 4 MG disintegrating tablet Take 1 tablet (4 mg total) by mouth every 6 (six) hours as needed for nausea. 20 tablet 0  . OVER THE COUNTER MEDICATION Multiple Vitamins-Minerals-Take 1 tablet by mouth daily.    . ramipril (ALTACE) 2.5 MG capsule TAKE 1 CAPSULE(2.5 MG) BY MOUTH DAILY 90 capsule 0  . VICTOZA 18 MG/3ML SOPN ADMINISTER 1.2 MG UNDER THE SKIN DAILY 6 mL 3   No current facility-administered medications on file prior to visit.     No Known Allergies  Family History  Problem Relation Age of Onset  . Stroke Mother   . Heart attack Father   . Alzheimer's disease Sister        #1-Deceased  . Breast cancer Sister        #1  . Healthy Sister        #2    Social History   Social History  . Marital status: Married    Spouse name: N/A  . Number of children: 1  . Years of education: N/A  Occupational History  . Statistician    Social History Main Topics  . Smoking status: Never Smoker  . Smokeless tobacco: Never Used  . Alcohol use 0.0 oz/week     Comment: occas.  . Drug use: No  . Sexual activity: Yes    Partners: Male   Other Topics Concern  . None   Social History Narrative  . None   Review of Systems - See HPI.  All other ROS are negative.  BP 110/60   Pulse 78   Temp 97.7 F (36.5 C) (Oral)   Resp 14   Ht '5\' 4"'$  (1.626 m)   Wt 142 lb (64.4 kg)   SpO2 98%   BMI 24.37 kg/m   Physical Exam  Constitutional: She is oriented  to person, place, and time and well-developed, well-nourished, and in no distress.  HENT:  Head: Normocephalic and atraumatic.  Eyes: Conjunctivae are normal.  Neck: Neck supple.  Cardiovascular: Normal rate, regular rhythm, normal heart sounds and intact distal pulses.   Pulmonary/Chest: Effort normal and breath sounds normal. No respiratory distress. She has no wheezes. She has no rales. She exhibits no tenderness.  Abdominal: Soft. Bowel sounds are normal. She exhibits no distension and no mass. There is no tenderness. There is no rebound and no guarding.  Neurological: She is alert and oriented to person, place, and time.  Skin: Skin is warm and dry. No rash noted.  Psychiatric: Affect normal.  Vitals reviewed.  Diabetic Foot Exam - Simple   Simple Foot Form Diabetic Foot exam was performed with the following findings:  Yes 04/22/2017 11:53 AM  Visual Inspection No deformities, no ulcerations, no other skin breakdown bilaterally:  Yes Sensation Testing Intact to touch and monofilament testing bilaterally:  Yes Pulse Check Posterior Tibialis and Dorsalis pulse intact bilaterally:  Yes Comments      Recent Results (from the past 2160 hour(s))  CBC with Differential     Status: Abnormal   Collection Time: 04/02/17  4:50 AM  Result Value Ref Range   WBC 13.5 (H) 4.0 - 10.5 K/uL   RBC 4.50 3.87 - 5.11 MIL/uL   Hemoglobin 13.5 12.0 - 15.0 g/dL   HCT 39.8 36.0 - 46.0 %   MCV 88.4 78.0 - 100.0 fL   MCH 30.0 26.0 - 34.0 pg   MCHC 33.9 30.0 - 36.0 g/dL   RDW 12.8 11.5 - 15.5 %   Platelets 290 150 - 400 K/uL   Neutrophils Relative % 66 %   Neutro Abs 9.0 (H) 1.7 - 7.7 K/uL   Lymphocytes Relative 19 %   Lymphs Abs 2.6 0.7 - 4.0 K/uL   Monocytes Relative 10 %   Monocytes Absolute 1.3 (H) 0.1 - 1.0 K/uL   Eosinophils Relative 4 %   Eosinophils Absolute 0.5 0.0 - 0.7 K/uL   Basophils Relative 1 %   Basophils Absolute 0.1 0.0 - 0.1 K/uL  Comprehensive metabolic panel     Status:  Abnormal   Collection Time: 04/02/17  4:50 AM  Result Value Ref Range   Sodium 138 135 - 145 mmol/L   Potassium 4.2 3.5 - 5.1 mmol/L   Chloride 99 (L) 101 - 111 mmol/L   CO2 28 22 - 32 mmol/L   Glucose, Bld 204 (H) 65 - 99 mg/dL   BUN 18 6 - 20 mg/dL   Creatinine, Ser 0.77 0.44 - 1.00 mg/dL   Calcium 10.0 8.9 - 10.3 mg/dL   Total Protein 7.2 6.5 -  8.1 g/dL   Albumin 3.9 3.5 - 5.0 g/dL   AST 696 (H) 15 - 41 U/L   ALT 673 (H) 14 - 54 U/L   Alkaline Phosphatase 443 (H) 38 - 126 U/L   Total Bilirubin 1.3 (H) 0.3 - 1.2 mg/dL   GFR calc non Af Amer >60 >60 mL/min   GFR calc Af Amer >60 >60 mL/min    Comment: (NOTE) The eGFR has been calculated using the CKD EPI equation. This calculation has not been validated in all clinical situations. eGFR's persistently <60 mL/min signify possible Chronic Kidney Disease.    Anion gap 11 5 - 15  Troponin I     Status: None   Collection Time: 04/02/17  4:50 AM  Result Value Ref Range   Troponin I <0.03 <0.03 ng/mL  Lipase, blood     Status: Abnormal   Collection Time: 04/02/17  4:50 AM  Result Value Ref Range   Lipase 7,620 (H) 11 - 51 U/L    Comment: RESULTS CONFIRMED BY MANUAL DILUTION RESULT CALLED TO, READ BACK BY AND VERIFIED WITH: DR PALUMBO AT 0610 ON [id number] BY CHERESNOWSKY,T   CBG monitoring, ED     Status: Abnormal   Collection Time: 04/02/17  7:47 AM  Result Value Ref Range   Glucose-Capillary 174 (H) 65 - 99 mg/dL   Comment 1 Notify RN   CBG monitoring, ED     Status: Abnormal   Collection Time: 04/02/17  1:09 PM  Result Value Ref Range   Glucose-Capillary 152 (H) 65 - 99 mg/dL  Glucose, capillary     Status: Abnormal   Collection Time: 04/02/17  6:36 PM  Result Value Ref Range   Glucose-Capillary 142 (H) 65 - 99 mg/dL  CBC     Status: Abnormal   Collection Time: 04/02/17  6:41 PM  Result Value Ref Range   WBC 12.7 (H) 4.0 - 10.5 K/uL   RBC 4.27 3.87 - 5.11 MIL/uL   Hemoglobin 12.8 12.0 - 15.0 g/dL   HCT 37.6 36.0 -  46.0 %   MCV 88.1 78.0 - 100.0 fL   MCH 30.0 26.0 - 34.0 pg   MCHC 34.0 30.0 - 36.0 g/dL   RDW 13.3 11.5 - 15.5 %   Platelets 234 150 - 400 K/uL  Creatinine, serum     Status: None   Collection Time: 04/02/17  6:41 PM  Result Value Ref Range   Creatinine, Ser 0.71 0.44 - 1.00 mg/dL   GFR calc non Af Amer >60 >60 mL/min   GFR calc Af Amer >60 >60 mL/min    Comment: (NOTE) The eGFR has been calculated using the CKD EPI equation. This calculation has not been validated in all clinical situations. eGFR's persistently <60 mL/min signify possible Chronic Kidney Disease.   Glucose, capillary     Status: Abnormal   Collection Time: 04/02/17  8:37 PM  Result Value Ref Range   Glucose-Capillary 113 (H) 65 - 99 mg/dL   Comment 1 Notify RN   Glucose, capillary     Status: Abnormal   Collection Time: 04/03/17 12:08 AM  Result Value Ref Range   Glucose-Capillary 113 (H) 65 - 99 mg/dL  Comprehensive metabolic panel     Status: Abnormal   Collection Time: 04/03/17  3:49 AM  Result Value Ref Range   Sodium 141 135 - 145 mmol/L   Potassium 4.2 3.5 - 5.1 mmol/L   Chloride 107 101 - 111 mmol/L   CO2 24 22 -  32 mmol/L   Glucose, Bld 105 (H) 65 - 99 mg/dL   BUN 11 6 - 20 mg/dL   Creatinine, Ser 0.59 0.44 - 1.00 mg/dL   Calcium 8.9 8.9 - 10.3 mg/dL   Total Protein 6.5 6.5 - 8.1 g/dL   Albumin 3.4 (L) 3.5 - 5.0 g/dL   AST 350 (H) 15 - 41 U/L   ALT 596 (H) 14 - 54 U/L   Alkaline Phosphatase 412 (H) 38 - 126 U/L   Total Bilirubin 1.9 (H) 0.3 - 1.2 mg/dL   GFR calc non Af Amer >60 >60 mL/min   GFR calc Af Amer >60 >60 mL/min    Comment: (NOTE) The eGFR has been calculated using the CKD EPI equation. This calculation has not been validated in all clinical situations. eGFR's persistently <60 mL/min signify possible Chronic Kidney Disease.    Anion gap 10 5 - 15  Lipase, blood     Status: Abnormal   Collection Time: 04/03/17  3:49 AM  Result Value Ref Range   Lipase 145 (H) 11 - 51 U/L    Glucose, capillary     Status: None   Collection Time: 04/03/17  4:06 AM  Result Value Ref Range   Glucose-Capillary 95 65 - 99 mg/dL   Comment 1 Notify RN   Glucose, capillary     Status: Abnormal   Collection Time: 04/03/17  7:54 AM  Result Value Ref Range   Glucose-Capillary 116 (H) 65 - 99 mg/dL   Comment 1 Notify RN    Comment 2 Document in Chart   CEA     Status: None   Collection Time: 04/03/17 10:29 AM  Result Value Ref Range   CEA 2.2 0.0 - 4.7 ng/mL    Comment: (NOTE)       Roche ECLIA methodology       Nonsmokers  <3.9                                     Smokers     <5.6 Performed At: [facility] [place], [place] [id number] Lindon Romp MD IH:[id number]   Cancer antigen 19-9     Status: Abnormal   Collection Time: 04/03/17 10:29 AM  Result Value Ref Range   CA 19-9 88 (H) 0 - 35 U/mL    Comment: (NOTE) Roche ECLIA methodology Performed At: [facility] [address] [place], [place] [id number] Lindon Romp MD [id number]   Glucose, capillary     Status: Abnormal   Collection Time: 04/03/17 11:39 AM  Result Value Ref Range   Glucose-Capillary 138 (H) 65 - 99 mg/dL   Comment 1 Notify RN    Comment 2 Document in Chart   Glucose, capillary     Status: Abnormal   Collection Time: 04/03/17  5:15 PM  Result Value Ref Range   Glucose-Capillary 113 (H) 65 - 99 mg/dL   Comment 1 Notify RN    Comment 2 Document in Chart   Glucose, capillary     Status: Abnormal   Collection Time: 04/03/17  9:02 PM  Result Value Ref Range   Glucose-Capillary 189 (H) 65 - 99 mg/dL   Comment 1 Notify RN    Comment 2 Document in Chart   Comprehensive metabolic panel     Status: Abnormal   Collection Time: 04/04/17  4:50 AM  Result Value Ref Range  Sodium 142 135 - 145 mmol/L   Potassium 3.9 3.5 - 5.1 mmol/L   Chloride 113 (H) 101 - 111 mmol/L   CO2 22 22 - 32 mmol/L   Glucose, Bld 155 (H) 65 - 99 mg/dL   BUN 5 (L) 6 - 20 mg/dL    Creatinine, Ser 0.53 0.44 - 1.00 mg/dL   Calcium 8.2 (L) 8.9 - 10.3 mg/dL   Total Protein 5.7 (L) 6.5 - 8.1 g/dL   Albumin 3.0 (L) 3.5 - 5.0 g/dL   AST 117 (H) 15 - 41 U/L   ALT 322 (H) 14 - 54 U/L   Alkaline Phosphatase 287 (H) 38 - 126 U/L   Total Bilirubin 0.8 0.3 - 1.2 mg/dL   GFR calc non Af Amer >60 >60 mL/min   GFR calc Af Amer >60 >60 mL/min    Comment: (NOTE) The eGFR has been calculated using the CKD EPI equation. This calculation has not been validated in all clinical situations. eGFR's persistently <60 mL/min signify possible Chronic Kidney Disease.    Anion gap 7 5 - 15  CBC with Differential/Platelet     Status: Abnormal   Collection Time: 04/04/17  4:50 AM  Result Value Ref Range   WBC 8.4 4.0 - 10.5 K/uL   RBC 3.77 (L) 3.87 - 5.11 MIL/uL   Hemoglobin 11.2 (L) 12.0 - 15.0 g/dL   HCT 33.3 (L) 36.0 - 46.0 %   MCV 88.3 78.0 - 100.0 fL   MCH 29.7 26.0 - 34.0 pg   MCHC 33.6 30.0 - 36.0 g/dL   RDW 13.3 11.5 - 15.5 %   Platelets 244 150 - 400 K/uL   Neutrophils Relative % 63 %   Neutro Abs 5.3 1.7 - 7.7 K/uL   Lymphocytes Relative 18 %   Lymphs Abs 1.5 0.7 - 4.0 K/uL   Monocytes Relative 11 %   Monocytes Absolute 0.9 0.1 - 1.0 K/uL   Eosinophils Relative 7 %   Eosinophils Absolute 0.6 0.0 - 0.7 K/uL   Basophils Relative 1 %   Basophils Absolute 0.1 0.0 - 0.1 K/uL  Magnesium     Status: Abnormal   Collection Time: 04/04/17  4:50 AM  Result Value Ref Range   Magnesium 1.2 (L) 1.7 - 2.4 mg/dL  Glucose, capillary     Status: Abnormal   Collection Time: 04/04/17  7:35 AM  Result Value Ref Range   Glucose-Capillary 222 (H) 65 - 99 mg/dL   Comment 1 Notify RN    Comment 2 Document in Chart   Glucose, capillary     Status: Abnormal   Collection Time: 04/04/17 11:48 AM  Result Value Ref Range   Glucose-Capillary 181 (H) 65 - 99 mg/dL   Comment 1 Notify RN    Comment 2 Document in Chart   Glucose, capillary     Status: Abnormal   Collection Time: 04/04/17  5:00  PM  Result Value Ref Range   Glucose-Capillary 173 (H) 65 - 99 mg/dL   Comment 1 Notify RN    Comment 2 Document in Chart   Glucose, capillary     Status: Abnormal   Collection Time: 04/04/17 10:01 PM  Result Value Ref Range   Glucose-Capillary 210 (H) 65 - 99 mg/dL  CBC     Status: Abnormal   Collection Time: 04/05/17  4:01 AM  Result Value Ref Range   WBC 8.1 4.0 - 10.5 K/uL   RBC 3.89 3.87 - 5.11 MIL/uL   Hemoglobin 11.4 (  L) 12.0 - 15.0 g/dL   HCT 33.7 (L) 36.0 - 46.0 %   MCV 86.6 78.0 - 100.0 fL   MCH 29.3 26.0 - 34.0 pg   MCHC 33.8 30.0 - 36.0 g/dL   RDW 13.2 11.5 - 15.5 %   Platelets 244 150 - 400 K/uL  Comprehensive metabolic panel     Status: Abnormal   Collection Time: 04/05/17  4:01 AM  Result Value Ref Range   Sodium 141 135 - 145 mmol/L   Potassium 3.7 3.5 - 5.1 mmol/L   Chloride 111 101 - 111 mmol/L   CO2 23 22 - 32 mmol/L   Glucose, Bld 136 (H) 65 - 99 mg/dL   BUN 6 6 - 20 mg/dL   Creatinine, Ser 0.53 0.44 - 1.00 mg/dL   Calcium 8.3 (L) 8.9 - 10.3 mg/dL   Total Protein 5.8 (L) 6.5 - 8.1 g/dL   Albumin 3.1 (L) 3.5 - 5.0 g/dL   AST 59 (H) 15 - 41 U/L   ALT 239 (H) 14 - 54 U/L   Alkaline Phosphatase 246 (H) 38 - 126 U/L   Total Bilirubin 0.6 0.3 - 1.2 mg/dL   GFR calc non Af Amer >60 >60 mL/min   GFR calc Af Amer >60 >60 mL/min    Comment: (NOTE) The eGFR has been calculated using the CKD EPI equation. This calculation has not been validated in all clinical situations. eGFR's persistently <60 mL/min signify possible Chronic Kidney Disease.    Anion gap 7 5 - 15  Lipase, blood     Status: None   Collection Time: 04/05/17  4:01 AM  Result Value Ref Range   Lipase 49 11 - 51 U/L  Glucose, capillary     Status: Abnormal   Collection Time: 04/05/17  8:00 AM  Result Value Ref Range   Glucose-Capillary 142 (H) 65 - 99 mg/dL  Glucose, capillary     Status: Abnormal   Collection Time: 04/05/17  1:06 PM  Result Value Ref Range   Glucose-Capillary 236 (H) 65  - 99 mg/dL  Comprehensive metabolic panel     Status: Abnormal   Collection Time: 04/05/17  3:06 PM  Result Value Ref Range   Sodium 139 135 - 145 mmol/L   Potassium 3.5 3.5 - 5.1 mmol/L   Chloride 109 101 - 111 mmol/L   CO2 20 (L) 22 - 32 mmol/L   Glucose, Bld 265 (H) 65 - 99 mg/dL   BUN 6 6 - 20 mg/dL   Creatinine, Ser 0.61 0.44 - 1.00 mg/dL   Calcium 7.8 (L) 8.9 - 10.3 mg/dL   Total Protein 6.5 6.5 - 8.1 g/dL   Albumin 3.3 (L) 3.5 - 5.0 g/dL   AST 61 (H) 15 - 41 U/L   ALT 228 (H) 14 - 54 U/L   Alkaline Phosphatase 267 (H) 38 - 126 U/L   Total Bilirubin 0.3 0.3 - 1.2 mg/dL   GFR calc non Af Amer >60 >60 mL/min   GFR calc Af Amer >60 >60 mL/min    Comment: (NOTE) The eGFR has been calculated using the CKD EPI equation. This calculation has not been validated in all clinical situations. eGFR's persistently <60 mL/min signify possible Chronic Kidney Disease.    Anion gap 10 5 - 15  Glucose, capillary     Status: Abnormal   Collection Time: 04/05/17  5:09 PM  Result Value Ref Range   Glucose-Capillary 299 (H) 65 - 99 mg/dL  Glucose, capillary  Status: Abnormal   Collection Time: 04/05/17 10:03 PM  Result Value Ref Range   Glucose-Capillary 268 (H) 65 - 99 mg/dL  CBC     Status: Abnormal   Collection Time: 04/06/17  4:06 AM  Result Value Ref Range   WBC 8.0 4.0 - 10.5 K/uL   RBC 3.95 3.87 - 5.11 MIL/uL   Hemoglobin 11.5 (L) 12.0 - 15.0 g/dL   HCT 34.1 (L) 36.0 - 46.0 %   MCV 86.3 78.0 - 100.0 fL   MCH 29.1 26.0 - 34.0 pg   MCHC 33.7 30.0 - 36.0 g/dL   RDW 13.0 11.5 - 15.5 %   Platelets 233 150 - 400 K/uL  Lipase, blood     Status: None   Collection Time: 04/06/17  4:06 AM  Result Value Ref Range   Lipase 43 11 - 51 U/L  Comprehensive metabolic panel     Status: Abnormal   Collection Time: 04/06/17  4:06 AM  Result Value Ref Range   Sodium 141 135 - 145 mmol/L   Potassium 4.1 3.5 - 5.1 mmol/L   Chloride 110 101 - 111 mmol/L   CO2 25 22 - 32 mmol/L   Glucose,  Bld 237 (H) 65 - 99 mg/dL   BUN 10 6 - 20 mg/dL   Creatinine, Ser 0.61 0.44 - 1.00 mg/dL   Calcium 8.6 (L) 8.9 - 10.3 mg/dL   Total Protein 6.2 (L) 6.5 - 8.1 g/dL   Albumin 3.1 (L) 3.5 - 5.0 g/dL   AST 38 15 - 41 U/L   ALT 190 (H) 14 - 54 U/L   Alkaline Phosphatase 237 (H) 38 - 126 U/L   Total Bilirubin 0.5 0.3 - 1.2 mg/dL   GFR calc non Af Amer >60 >60 mL/min   GFR calc Af Amer >60 >60 mL/min    Comment: (NOTE) The eGFR has been calculated using the CKD EPI equation. This calculation has not been validated in all clinical situations. eGFR's persistently <60 mL/min signify possible Chronic Kidney Disease.    Anion gap 6 5 - 15  Magnesium     Status: Abnormal   Collection Time: 04/06/17  4:06 AM  Result Value Ref Range   Magnesium 1.4 (L) 1.7 - 2.4 mg/dL  Hemoglobin A1c     Status: Abnormal   Collection Time: 04/06/17  4:06 AM  Result Value Ref Range   Hgb A1c MFr Bld 6.5 (H) 4.8 - 5.6 %    Comment: (NOTE) Pre diabetes:          5.7%-6.4% Diabetes:              >6.4% Glycemic control for   <7.0% adults with diabetes    Mean Plasma Glucose 139.85 mg/dL    Comment: Performed at [facility] [address], [place], [place] [address]  MRSA PCR Screening     Status: None   Collection Time: 04/06/17  7:07 AM  Result Value Ref Range   MRSA by PCR NEGATIVE NEGATIVE    Comment:        The GeneXpert MRSA Assay (FDA approved for NASAL specimens only), is one component of a comprehensive MRSA colonization surveillance program. It is not intended to diagnose MRSA infection nor to guide or monitor treatment for MRSA infections.   Glucose, capillary     Status: Abnormal   Collection Time: 04/06/17  7:54 AM  Result Value Ref Range   Glucose-Capillary 178 (H) 65 - 99 mg/dL   Comment  1 Notify RN    Comment 2 Document in Chart   Glucose, capillary     Status: Abnormal   Collection Time: 04/06/17 11:03 AM  Result Value Ref Range   Glucose-Capillary 102 (H) 65 - 99 mg/dL     Comment 1 Document in Chart   Glucose, capillary     Status: Abnormal   Collection Time: 04/06/17  2:05 PM  Result Value Ref Range   Glucose-Capillary 258 (H) 65 - 99 mg/dL   Comment 1 Notify RN    Comment 2 Document in Chart   Glucose, capillary     Status: Abnormal   Collection Time: 04/06/17  5:27 PM  Result Value Ref Range   Glucose-Capillary 256 (H) 65 - 99 mg/dL   Comment 1 Notify RN    Comment 2 Document in Chart   Glucose, capillary     Status: Abnormal   Collection Time: 04/06/17  8:35 PM  Result Value Ref Range   Glucose-Capillary 281 (H) 65 - 99 mg/dL   Comment 1 Notify RN   Comprehensive metabolic panel     Status: Abnormal   Collection Time: 04/07/17  3:57 AM  Result Value Ref Range   Sodium 138 135 - 145 mmol/L   Potassium 4.1 3.5 - 5.1 mmol/L   Chloride 104 101 - 111 mmol/L   CO2 26 22 - 32 mmol/L   Glucose, Bld 280 (H) 65 - 99 mg/dL   BUN 7 6 - 20 mg/dL   Creatinine, Ser 0.61 0.44 - 1.00 mg/dL   Calcium 8.2 (L) 8.9 - 10.3 mg/dL   Total Protein 6.3 (L) 6.5 - 8.1 g/dL   Albumin 3.4 (L) 3.5 - 5.0 g/dL   AST 97 (H) 15 - 41 U/L   ALT 207 (H) 14 - 54 U/L   Alkaline Phosphatase 206 (H) 38 - 126 U/L   Total Bilirubin 0.4 0.3 - 1.2 mg/dL   GFR calc non Af Amer >60 >60 mL/min   GFR calc Af Amer >60 >60 mL/min    Comment: (NOTE) The eGFR has been calculated using the CKD EPI equation. This calculation has not been validated in all clinical situations. eGFR's persistently <60 mL/min signify possible Chronic Kidney Disease.    Anion gap 8 5 - 15  CBC with Differential/Platelet     Status: Abnormal   Collection Time: 04/07/17  3:57 AM  Result Value Ref Range   WBC 14.6 (H) 4.0 - 10.5 K/uL   RBC 3.60 (L) 3.87 - 5.11 MIL/uL   Hemoglobin 10.7 (L) 12.0 - 15.0 g/dL   HCT 32.3 (L) 36.0 - 46.0 %   MCV 89.7 78.0 - 100.0 fL   MCH 29.7 26.0 - 34.0 pg   MCHC 33.1 30.0 - 36.0 g/dL   RDW 13.8 11.5 - 15.5 %   Platelets 243 150 - 400 K/uL   Neutrophils Relative % 79 %    Neutro Abs 11.6 (H) 1.7 - 7.7 K/uL   Lymphocytes Relative 9 %   Lymphs Abs 1.4 0.7 - 4.0 K/uL   Monocytes Relative 12 %   Monocytes Absolute 1.7 (H) 0.1 - 1.0 K/uL   Eosinophils Relative 0 %   Eosinophils Absolute 0.0 0.0 - 0.7 K/uL   Basophils Relative 0 %   Basophils Absolute 0.0 0.0 - 0.1 K/uL  Magnesium     Status: Abnormal   Collection Time: 04/07/17  3:57 AM  Result Value Ref Range   Magnesium 1.5 (L) 1.7 - 2.4 mg/dL  Glucose, capillary     Status: Abnormal   Collection Time: 04/07/17  7:52 AM  Result Value Ref Range   Glucose-Capillary 264 (H) 65 - 99 mg/dL   Comment 1 Notify RN    Comment 2 Document in Chart   Glucose, capillary     Status: Abnormal   Collection Time: 04/07/17 11:40 AM  Result Value Ref Range   Glucose-Capillary 220 (H) 65 - 99 mg/dL   Comment 1 Notify RN    Comment 2 Document in Chart   Glucose, capillary     Status: Abnormal   Collection Time: 04/07/17  5:19 PM  Result Value Ref Range   Glucose-Capillary 200 (H) 65 - 99 mg/dL   Comment 1 Notify RN    Comment 2 Document in Chart   Glucose, capillary     Status: Abnormal   Collection Time: 04/07/17  8:52 PM  Result Value Ref Range   Glucose-Capillary 175 (H) 65 - 99 mg/dL   Comment 1 Notify RN   Magnesium     Status: None   Collection Time: 04/08/17  3:48 AM  Result Value Ref Range   Magnesium 1.9 1.7 - 2.4 mg/dL  Comprehensive metabolic panel     Status: Abnormal   Collection Time: 04/08/17  3:48 AM  Result Value Ref Range   Sodium 135 135 - 145 mmol/L   Potassium 4.0 3.5 - 5.1 mmol/L   Chloride 101 101 - 111 mmol/L   CO2 27 22 - 32 mmol/L   Glucose, Bld 220 (H) 65 - 99 mg/dL   BUN 6 6 - 20 mg/dL   Creatinine, Ser 0.52 0.44 - 1.00 mg/dL   Calcium 8.3 (L) 8.9 - 10.3 mg/dL   Total Protein 6.3 (L) 6.5 - 8.1 g/dL   Albumin 3.3 (L) 3.5 - 5.0 g/dL   AST 69 (H) 15 - 41 U/L   ALT 174 (H) 14 - 54 U/L   Alkaline Phosphatase 193 (H) 38 - 126 U/L   Total Bilirubin 0.5 0.3 - 1.2 mg/dL   GFR calc  non Af Amer >60 >60 mL/min   GFR calc Af Amer >60 >60 mL/min    Comment: (NOTE) The eGFR has been calculated using the CKD EPI equation. This calculation has not been validated in all clinical situations. eGFR's persistently <60 mL/min signify possible Chronic Kidney Disease.    Anion gap 7 5 - 15  CBC with Differential/Platelet     Status: Abnormal   Collection Time: 04/08/17  3:48 AM  Result Value Ref Range   WBC 10.3 4.0 - 10.5 K/uL   RBC 3.55 (L) 3.87 - 5.11 MIL/uL   Hemoglobin 10.6 (L) 12.0 - 15.0 g/dL   HCT 31.2 (L) 36.0 - 46.0 %   MCV 87.9 78.0 - 100.0 fL   MCH 29.9 26.0 - 34.0 pg   MCHC 34.0 30.0 - 36.0 g/dL   RDW 13.4 11.5 - 15.5 %   Platelets 225 150 - 400 K/uL   Neutrophils Relative % 77 %   Neutro Abs 7.9 (H) 1.7 - 7.7 K/uL   Lymphocytes Relative 12 %   Lymphs Abs 1.2 0.7 - 4.0 K/uL   Monocytes Relative 11 %   Monocytes Absolute 1.1 (H) 0.1 - 1.0 K/uL   Eosinophils Relative 0 %   Eosinophils Absolute 0.0 0.0 - 0.7 K/uL   Basophils Relative 0 %   Basophils Absolute 0.0 0.0 - 0.1 K/uL  Glucose, capillary     Status: Abnormal  Collection Time: 04/08/17  7:58 AM  Result Value Ref Range   Glucose-Capillary 203 (H) 65 - 99 mg/dL  Glucose, capillary     Status: Abnormal   Collection Time: 04/08/17 12:03 PM  Result Value Ref Range   Glucose-Capillary 200 (H) 65 - 99 mg/dL  Glucose, capillary     Status: Abnormal   Collection Time: 04/08/17  5:45 PM  Result Value Ref Range   Glucose-Capillary 149 (H) 65 - 99 mg/dL  Glucose, capillary     Status: Abnormal   Collection Time: 04/08/17  9:36 PM  Result Value Ref Range   Glucose-Capillary 124 (H) 65 - 99 mg/dL   Comment 1 Notify RN   Comprehensive metabolic panel     Status: Abnormal   Collection Time: 04/09/17  3:42 AM  Result Value Ref Range   Sodium 142 135 - 145 mmol/L    Comment: DELTA CHECK NOTED REPEATED TO VERIFY    Potassium 3.8 3.5 - 5.1 mmol/L   Chloride 107 101 - 111 mmol/L   CO2 28 22 - 32 mmol/L     Glucose, Bld 124 (H) 65 - 99 mg/dL   BUN <5 (L) 6 - 20 mg/dL   Creatinine, Ser 0.60 0.44 - 1.00 mg/dL   Calcium 8.4 (L) 8.9 - 10.3 mg/dL   Total Protein 5.6 (L) 6.5 - 8.1 g/dL   Albumin 2.9 (L) 3.5 - 5.0 g/dL   AST 34 15 - 41 U/L   ALT 118 (H) 14 - 54 U/L   Alkaline Phosphatase 154 (H) 38 - 126 U/L   Total Bilirubin 0.4 0.3 - 1.2 mg/dL   GFR calc non Af Amer >60 >60 mL/min   GFR calc Af Amer >60 >60 mL/min    Comment: (NOTE) The eGFR has been calculated using the CKD EPI equation. This calculation has not been validated in all clinical situations. eGFR's persistently <60 mL/min signify possible Chronic Kidney Disease.    Anion gap 7 5 - 15  CBC     Status: Abnormal   Collection Time: 04/09/17  3:42 AM  Result Value Ref Range   WBC 7.9 4.0 - 10.5 K/uL   RBC 3.50 (L) 3.87 - 5.11 MIL/uL   Hemoglobin 10.1 (L) 12.0 - 15.0 g/dL   HCT 31.7 (L) 36.0 - 46.0 %   MCV 90.6 78.0 - 100.0 fL   MCH 28.9 26.0 - 34.0 pg   MCHC 31.9 30.0 - 36.0 g/dL   RDW 13.6 11.5 - 15.5 %   Platelets 257 150 - 400 K/uL  Glucose, capillary     Status: Abnormal   Collection Time: 04/09/17  8:32 AM  Result Value Ref Range   Glucose-Capillary 233 (H) 65 - 99 mg/dL  Glucose, capillary     Status: Abnormal   Collection Time: 04/09/17 12:47 PM  Result Value Ref Range   Glucose-Capillary 113 (H) 65 - 99 mg/dL    Assessment/Plan: 1. Gallstone pancreatitis S/p removal vial ERCP. Pathology negative for any malignancy. Patient denies pain. No tenderness on examination today. Repeat labs.  - Comp Met (CMET)  2. S/P cholecystectomy With empyema. No residual symptoms. Follow-up scheduled with surgeon.  3. Hyperlipidemia associated with type 2 diabetes mellitus ([place]) Repeat labs today. - Comp Met (CMET) - Lipid Profile  4. Need for pneumococcal vaccination Pneumovax updated today. - Pneumococcal polysaccharide vaccine 23-valent greater than or equal to 2yo subcutaneous/IM  5. Encounter for  immunization Flu shot given. - Flu vaccine HIGH DOSE PF  Leeanne Rio, PA-C

## 2017-04-22 NOTE — Patient Instructions (Signed)
Please go to the lab today for blood work.  I will call you with your results. We will alter treatment regimen(s) if indicated by your results.   Continue oral medications and Victoza as directed. Keep skin clean and dry. The port wounds are healing well. Follow-up with general surgery today as scheduled.  Follow-up will be based on lab results.

## 2017-04-26 ENCOUNTER — Other Ambulatory Visit: Payer: Self-pay | Admitting: Physician Assistant

## 2017-04-26 DIAGNOSIS — E1169 Type 2 diabetes mellitus with other specified complication: Secondary | ICD-10-CM

## 2017-04-26 DIAGNOSIS — E785 Hyperlipidemia, unspecified: Principal | ICD-10-CM

## 2017-04-26 MED ORDER — ATORVASTATIN CALCIUM 20 MG PO TABS: ORAL_TABLET | ORAL | 3 refills | Status: DC

## 2017-04-28 ENCOUNTER — Other Ambulatory Visit (INDEPENDENT_AMBULATORY_CARE_PROVIDER_SITE_OTHER): Payer: Medicare Other

## 2017-04-28 ENCOUNTER — Encounter: Payer: Self-pay | Admitting: Physician Assistant

## 2017-04-28 DIAGNOSIS — L299 Pruritus, unspecified: Secondary | ICD-10-CM | POA: Diagnosis not present

## 2017-04-28 DIAGNOSIS — E785 Hyperlipidemia, unspecified: Secondary | ICD-10-CM

## 2017-04-28 DIAGNOSIS — E1169 Type 2 diabetes mellitus with other specified complication: Secondary | ICD-10-CM | POA: Diagnosis not present

## 2017-04-28 LAB — TSH: TSH: 0.82 u[IU]/mL (ref 0.35–4.50)

## 2017-05-11 ENCOUNTER — Telehealth: Payer: Self-pay

## 2017-05-11 ENCOUNTER — Other Ambulatory Visit: Payer: Self-pay

## 2017-05-11 NOTE — Telephone Encounter (Signed)
-----   Message from Jeoffrey Massed, RN sent at 05/10/2017 10:24 AM EST -----   ----- Message ----- From: Marlon Pel, RN Sent: 05/10/2017 To: Marlon Pel, RN  Needs labs. In EPIC follow up ERCP-[facility]

## 2017-05-11 NOTE — Telephone Encounter (Signed)
Left message for patient to come get some lab work done, order in [employer]. If she has questions or concerns to please call back.

## 2017-05-13 ENCOUNTER — Other Ambulatory Visit (INDEPENDENT_AMBULATORY_CARE_PROVIDER_SITE_OTHER): Payer: Medicare Other

## 2017-05-13 DIAGNOSIS — R945 Abnormal results of liver function studies: Secondary | ICD-10-CM

## 2017-05-13 DIAGNOSIS — R7989 Other specified abnormal findings of blood chemistry: Secondary | ICD-10-CM

## 2017-05-13 LAB — HEPATIC FUNCTION PANEL
ALT: 12 U/L (ref 0–35)
AST: 15 U/L (ref 0–37)
Albumin: 4.1 g/dL (ref 3.5–5.2)
Alkaline Phosphatase: 72 U/L (ref 39–117)
BILIRUBIN DIRECT: 0.1 mg/dL (ref 0.0–0.3)
BILIRUBIN TOTAL: 0.6 mg/dL (ref 0.2–1.2)
TOTAL PROTEIN: 7 g/dL (ref 6.0–8.3)

## 2017-05-17 ENCOUNTER — Encounter: Payer: Self-pay | Admitting: Physician Assistant

## 2017-05-18 ENCOUNTER — Other Ambulatory Visit: Payer: Self-pay | Admitting: Physician Assistant

## 2017-05-18 DIAGNOSIS — L989 Disorder of the skin and subcutaneous tissue, unspecified: Secondary | ICD-10-CM

## 2017-05-20 DIAGNOSIS — L820 Inflamed seborrheic keratosis: Secondary | ICD-10-CM | POA: Diagnosis not present

## 2017-05-20 DIAGNOSIS — D485 Neoplasm of uncertain behavior of skin: Secondary | ICD-10-CM | POA: Diagnosis not present

## 2017-05-24 ENCOUNTER — Other Ambulatory Visit: Payer: Self-pay | Admitting: Physician Assistant

## 2017-06-07 ENCOUNTER — Other Ambulatory Visit (INDEPENDENT_AMBULATORY_CARE_PROVIDER_SITE_OTHER): Payer: Medicare Other

## 2017-06-07 DIAGNOSIS — R945 Abnormal results of liver function studies: Secondary | ICD-10-CM | POA: Diagnosis not present

## 2017-06-07 DIAGNOSIS — R7989 Other specified abnormal findings of blood chemistry: Secondary | ICD-10-CM

## 2017-06-07 DIAGNOSIS — E785 Hyperlipidemia, unspecified: Secondary | ICD-10-CM

## 2017-06-07 LAB — HEPATIC FUNCTION PANEL
ALT: 18 U/L (ref 0–35)
AST: 17 U/L (ref 0–37)
Albumin: 4.1 g/dL (ref 3.5–5.2)
Alkaline Phosphatase: 77 U/L (ref 39–117)
BILIRUBIN TOTAL: 0.3 mg/dL (ref 0.2–1.2)
Bilirubin, Direct: 0 mg/dL (ref 0.0–0.3)
Total Protein: 6.4 g/dL (ref 6.0–8.3)

## 2017-06-07 LAB — LIPID PANEL
CHOL/HDL RATIO: 2
CHOLESTEROL: 148 mg/dL (ref 0–200)
HDL: 59.9 mg/dL (ref >39.00)
LDL Cholesterol: 71 mg/dL (ref 0–99)
NONHDL: 88.29
Triglycerides: 84 mg/dL (ref 0.0–149.0)
VLDL: 16.8 mg/dL (ref 0.0–40.0)

## 2017-06-28 ENCOUNTER — Other Ambulatory Visit: Payer: Self-pay | Admitting: Physician Assistant

## 2017-06-28 NOTE — Telephone Encounter (Signed)
Medication filled to pharmacy as requested.   

## 2017-06-28 NOTE — Telephone Encounter (Signed)
Last OV 04/22/17 Alprazolam last filled 10/20/16 #30 with 0

## 2017-07-06 LAB — HM DIABETES EYE EXAM

## 2017-07-08 ENCOUNTER — Encounter: Payer: Self-pay | Admitting: Physician Assistant

## 2017-07-08 ENCOUNTER — Other Ambulatory Visit: Payer: Self-pay | Admitting: Emergency Medicine

## 2017-07-08 MED ORDER — METFORMIN HCL ER 500 MG PO TB24: ORAL_TABLET | ORAL | 1 refills | Status: DC

## 2017-07-12 ENCOUNTER — Other Ambulatory Visit: Payer: Self-pay | Admitting: Physician Assistant

## 2017-07-19 DIAGNOSIS — M546 Pain in thoracic spine: Secondary | ICD-10-CM | POA: Diagnosis not present

## 2017-07-19 DIAGNOSIS — M9901 Segmental and somatic dysfunction of cervical region: Secondary | ICD-10-CM | POA: Diagnosis not present

## 2017-07-19 DIAGNOSIS — M9902 Segmental and somatic dysfunction of thoracic region: Secondary | ICD-10-CM | POA: Diagnosis not present

## 2017-07-19 DIAGNOSIS — M542 Cervicalgia: Secondary | ICD-10-CM | POA: Diagnosis not present

## 2017-07-23 DIAGNOSIS — M9902 Segmental and somatic dysfunction of thoracic region: Secondary | ICD-10-CM | POA: Diagnosis not present

## 2017-07-23 DIAGNOSIS — M542 Cervicalgia: Secondary | ICD-10-CM | POA: Diagnosis not present

## 2017-07-23 DIAGNOSIS — M9901 Segmental and somatic dysfunction of cervical region: Secondary | ICD-10-CM | POA: Diagnosis not present

## 2017-07-23 DIAGNOSIS — M546 Pain in thoracic spine: Secondary | ICD-10-CM | POA: Diagnosis not present

## 2017-07-26 DIAGNOSIS — M542 Cervicalgia: Secondary | ICD-10-CM | POA: Diagnosis not present

## 2017-07-26 DIAGNOSIS — M9902 Segmental and somatic dysfunction of thoracic region: Secondary | ICD-10-CM | POA: Diagnosis not present

## 2017-07-26 DIAGNOSIS — M9901 Segmental and somatic dysfunction of cervical region: Secondary | ICD-10-CM | POA: Diagnosis not present

## 2017-07-26 DIAGNOSIS — M546 Pain in thoracic spine: Secondary | ICD-10-CM | POA: Diagnosis not present

## 2017-08-02 DIAGNOSIS — M542 Cervicalgia: Secondary | ICD-10-CM | POA: Diagnosis not present

## 2017-08-02 DIAGNOSIS — M9902 Segmental and somatic dysfunction of thoracic region: Secondary | ICD-10-CM | POA: Diagnosis not present

## 2017-08-02 DIAGNOSIS — M9901 Segmental and somatic dysfunction of cervical region: Secondary | ICD-10-CM | POA: Diagnosis not present

## 2017-08-02 DIAGNOSIS — M546 Pain in thoracic spine: Secondary | ICD-10-CM | POA: Diagnosis not present

## 2017-08-05 DIAGNOSIS — M9901 Segmental and somatic dysfunction of cervical region: Secondary | ICD-10-CM | POA: Diagnosis not present

## 2017-08-05 DIAGNOSIS — M546 Pain in thoracic spine: Secondary | ICD-10-CM | POA: Diagnosis not present

## 2017-08-05 DIAGNOSIS — M542 Cervicalgia: Secondary | ICD-10-CM | POA: Diagnosis not present

## 2017-08-05 DIAGNOSIS — M9902 Segmental and somatic dysfunction of thoracic region: Secondary | ICD-10-CM | POA: Diagnosis not present

## 2017-08-12 DIAGNOSIS — M9901 Segmental and somatic dysfunction of cervical region: Secondary | ICD-10-CM | POA: Diagnosis not present

## 2017-08-12 DIAGNOSIS — M546 Pain in thoracic spine: Secondary | ICD-10-CM | POA: Diagnosis not present

## 2017-08-12 DIAGNOSIS — M542 Cervicalgia: Secondary | ICD-10-CM | POA: Diagnosis not present

## 2017-08-12 DIAGNOSIS — M9902 Segmental and somatic dysfunction of thoracic region: Secondary | ICD-10-CM | POA: Diagnosis not present

## 2017-08-19 DIAGNOSIS — M546 Pain in thoracic spine: Secondary | ICD-10-CM | POA: Diagnosis not present

## 2017-08-19 DIAGNOSIS — M9902 Segmental and somatic dysfunction of thoracic region: Secondary | ICD-10-CM | POA: Diagnosis not present

## 2017-08-19 DIAGNOSIS — M542 Cervicalgia: Secondary | ICD-10-CM | POA: Diagnosis not present

## 2017-08-19 DIAGNOSIS — M9901 Segmental and somatic dysfunction of cervical region: Secondary | ICD-10-CM | POA: Diagnosis not present

## 2017-09-01 ENCOUNTER — Other Ambulatory Visit: Payer: Self-pay | Admitting: Physician Assistant

## 2017-09-01 DIAGNOSIS — E785 Hyperlipidemia, unspecified: Principal | ICD-10-CM

## 2017-09-01 DIAGNOSIS — E1169 Type 2 diabetes mellitus with other specified complication: Secondary | ICD-10-CM

## 2017-09-02 DIAGNOSIS — M9902 Segmental and somatic dysfunction of thoracic region: Secondary | ICD-10-CM | POA: Diagnosis not present

## 2017-09-02 DIAGNOSIS — M542 Cervicalgia: Secondary | ICD-10-CM | POA: Diagnosis not present

## 2017-09-02 DIAGNOSIS — M9901 Segmental and somatic dysfunction of cervical region: Secondary | ICD-10-CM | POA: Diagnosis not present

## 2017-09-02 DIAGNOSIS — M546 Pain in thoracic spine: Secondary | ICD-10-CM | POA: Diagnosis not present

## 2017-09-14 ENCOUNTER — Other Ambulatory Visit: Payer: Self-pay | Admitting: Physician Assistant

## 2017-09-30 DIAGNOSIS — M542 Cervicalgia: Secondary | ICD-10-CM | POA: Diagnosis not present

## 2017-09-30 DIAGNOSIS — M9902 Segmental and somatic dysfunction of thoracic region: Secondary | ICD-10-CM | POA: Diagnosis not present

## 2017-09-30 DIAGNOSIS — M546 Pain in thoracic spine: Secondary | ICD-10-CM | POA: Diagnosis not present

## 2017-09-30 DIAGNOSIS — M9901 Segmental and somatic dysfunction of cervical region: Secondary | ICD-10-CM | POA: Diagnosis not present

## 2017-10-11 ENCOUNTER — Encounter: Payer: Self-pay | Admitting: Physician Assistant

## 2017-10-11 ENCOUNTER — Ambulatory Visit (INDEPENDENT_AMBULATORY_CARE_PROVIDER_SITE_OTHER): Payer: Medicare Other | Admitting: Physician Assistant

## 2017-10-11 ENCOUNTER — Other Ambulatory Visit: Payer: Self-pay

## 2017-10-11 ENCOUNTER — Telehealth: Payer: Self-pay

## 2017-10-11 VITALS — BP 108/60 | HR 72 | Temp 97.8°F | Resp 16 | Ht 64.0 in | Wt 143.0 lb

## 2017-10-11 DIAGNOSIS — F32A Depression, unspecified: Secondary | ICD-10-CM

## 2017-10-11 DIAGNOSIS — F329 Major depressive disorder, single episode, unspecified: Secondary | ICD-10-CM | POA: Diagnosis not present

## 2017-10-11 DIAGNOSIS — E119 Type 2 diabetes mellitus without complications: Secondary | ICD-10-CM

## 2017-10-11 DIAGNOSIS — F419 Anxiety disorder, unspecified: Secondary | ICD-10-CM

## 2017-10-11 DIAGNOSIS — E1169 Type 2 diabetes mellitus with other specified complication: Secondary | ICD-10-CM | POA: Insufficient documentation

## 2017-10-11 DIAGNOSIS — E785 Hyperlipidemia, unspecified: Secondary | ICD-10-CM

## 2017-10-11 LAB — COMPREHENSIVE METABOLIC PANEL
ALBUMIN: 4.2 g/dL (ref 3.5–5.2)
ALT: 19 U/L (ref 0–35)
AST: 19 U/L (ref 0–37)
Alkaline Phosphatase: 79 U/L (ref 39–117)
BILIRUBIN TOTAL: 0.3 mg/dL (ref 0.2–1.2)
BUN: 16 mg/dL (ref 6–23)
CALCIUM: 10.1 mg/dL (ref 8.4–10.5)
CO2: 26 mEq/L (ref 19–32)
CREATININE: 0.6 mg/dL (ref 0.40–1.20)
Chloride: 105 mEq/L (ref 96–112)
GFR: 106.05 mL/min (ref >60.00)
Glucose, Bld: 128 mg/dL — ABNORMAL HIGH (ref 70–99)
Potassium: 4.6 mEq/L (ref 3.5–5.1)
Sodium: 140 mEq/L (ref 135–145)
Total Protein: 7.1 g/dL (ref 6.0–8.3)

## 2017-10-11 LAB — LIPID PANEL
CHOLESTEROL: 138 mg/dL (ref 0–200)
HDL: 66.6 mg/dL (ref >39.00)
LDL Cholesterol: 56 mg/dL (ref 0–99)
NonHDL: 71.56
TRIGLYCERIDES: 78 mg/dL (ref 0.0–149.0)
Total CHOL/HDL Ratio: 2
VLDL: 15.6 mg/dL (ref 0.0–40.0)

## 2017-10-11 LAB — POCT GLYCOSYLATED HEMOGLOBIN (HGB A1C): Hemoglobin A1C: 6.6

## 2017-10-11 NOTE — Progress Notes (Signed)
History of Present Illness: Patient is a 67 y.o. female who presents to clinic today for follow-up of Diabetes Mellitus II, previously well-controlled.  Patient currently on medication regimen of Metformin XR 2000 mg daily and Victoza 1.2 mg daily.  Is taking medications as directed. Endorses diet is only somewhat monitored. Does eat carbohydrates but tries to limit for the most part.  Denies polyuria, polydipsia or polyphagia. Is checking blood glucose as directed. Notes them averaging 130-150 fasting currently.    Latest Maintenance: A1C --  Lab Results  Component Value Date   HGBA1C 6.6 10/11/2017   Diabetic Eye Exam -- Up-to-date per patient. Endorses having in January. Previously with no sign of diabetic retinopathy. Will obtain report from recent eye exam for review.   Urine Microalbumin -- On ACEI Foot Exam -- Up-to-date. Denies concerns today.  Of note, patient has noticed increase anxiety and decreased mood recently due to increased stressors regarding husband's health. Denies SI/HI. Is praying to help with anxiety. Does not wish to start medication presently.   Past Medical History:  Diagnosis Date  . Anxiety   . Diabetes mellitus without complication (HCC)    per pt. borderline diabetic  . Fibrocystic disease of breast   . Mood swings     Current Outpatient Medications on File Prior to Visit  Medication Sig Dispense Refill  . ALPRAZolam (XANAX) 0.25 MG tablet TAKE 1 TABLET BY MOUTH EVERY 12 HOURS AS NEEDED FOR ANXIETY 30 tablet 0  . atorvastatin (LIPITOR) 20 MG tablet TAKE 1 TABLET BY MOUTH AT BEDTIME 90 tablet 1  . Blood Glucose Monitoring Suppl ([place]) KIT 1 kit by Does not apply route daily. Dx: E11.9 1 each 0  . Calcium Carb-Cholecalciferol (CALCIUM-VITAMIN D) 600-400 MG-UNIT TABS Take 2 each by mouth daily.    Marland Kitchen FLUoxetine (PROZAC) 40 MG capsule TAKE 1 CAPSULE(40 MG) BY MOUTH DAILY 90 capsule 1  . glucose blood (TRUETRACK TEST) test strip Use as  instructed daily Dx: E11.9 100 each 12  . Insulin Pen Needle 32G X 4 MM MISC 1 Stick by Does not apply route daily. Dx: E11.9 100 each 3  . metFORMIN (GLUCOPHAGE-XR) 500 MG 24 hr tablet TAKE 2 TABLETS(1000 MG) BY MOUTH TWICE DAILY WITH A MEAL 360 tablet 1  . OVER THE COUNTER MEDICATION Multiple Vitamins-Minerals-Take 1 tablet by mouth daily.    . ramipril (ALTACE) 2.5 MG capsule TAKE 1 CAPSULE(2.5 MG) BY MOUTH DAILY 90 capsule 1  . VICTOZA 18 MG/3ML SOPN ADMINISTER 1.2 MG UNDER THE SKIN DAILY 6 mL 6  . ondansetron (ZOFRAN-ODT) 4 MG disintegrating tablet Take 1 tablet (4 mg total) by mouth every 6 (six) hours as needed for nausea. (Patient not taking: Reported on 10/11/2017) 20 tablet 0   No current facility-administered medications on file prior to visit.     No Known Allergies  Family History  Problem Relation Age of Onset  . Stroke Mother   . Heart attack Father   . Alzheimer's disease Sister        #1-Deceased  . Breast cancer Sister        #1  . Healthy Sister        #2    Social History   Socioeconomic History  . Marital status: Married    Spouse name: Not on file  . Number of children: 1  . Years of education: Not on file  . Highest education level: Not on file  Occupational History  . Occupation: Scientist, research (physical sciences)  Secretary  Social Needs  . Financial resource strain: Not on file  . Food insecurity:    Worry: Not on file    Inability: Not on file  . Transportation needs:    Medical: Not on file    Non-medical: Not on file  Tobacco Use  . Smoking status: Never Smoker  . Smokeless tobacco: Never Used  Substance and Sexual Activity  . Alcohol use: Yes    Alcohol/week: 0.0 oz    Comment: occas.  . Drug use: No  . Sexual activity: Yes    Partners: Male  Lifestyle  . Physical activity:    Days per week: Not on file    Minutes per session: Not on file  . Stress: Not on file  Relationships  . Social connections:    Talks on phone: Not on file    Gets together: Not on file     Attends religious service: Not on file    Active member of club or organization: Not on file    Attends meetings of clubs or organizations: Not on file    Relationship status: Not on file  Other Topics Concern  . Not on file  Social History Narrative  . Not on file    Review of Systems: Pertinent ROS are listed in HPI  Physical Examination: BP 108/60   Pulse 72   Temp 97.8 F (36.6 C) (Oral)   Resp 16   Ht 5' 4" (1.626 m)   Wt 143 lb (64.9 kg)   SpO2 98%   BMI 24.55 kg/m  General appearance: alert, cooperative and appears stated age Head: Normocephalic, without obvious abnormality, atraumatic Ears: normal TM's and external ear canals both ears Nose: Nares normal. Septum midline. Mucosa normal. No drainage or sinus tenderness. Throat: lips, mucosa, and tongue normal; teeth and gums normal Lungs: clear to auscultation bilaterally Heart: regular rate and rhythm, S1, S2 normal, no murmur, click, rub or gallop Extremities: extremities normal, atraumatic, no cyanosis or edema Pulses: 2+ and symmetric Skin: Skin color, texture, turgor normal. No rashes or lesions Neurologic: Alert and oriented X 3, normal strength and tone. Normal symmetric reflexes. Normal coordination and gait  Diabetic Foot Exam - Simple   Simple Foot Form Diabetic Foot exam was performed with the following findings:  Yes 10/11/2017 11:06 AM  Visual Inspection Sensation Testing Pulse Check Comments     Assessment/Plan: 1. Hyperlipidemia associated with type 2 diabetes mellitus (HCC) Repeat fasting lipid panel and LFT today. Taking statin as directed. - Comp Met (CMET) - Lipid Profile  2. Type 2 diabetes mellitus without complication, without long-term current use of insulin (HCC) POC A1C at 6.6. Eye exam up-to-date. Foot exam performed today without abnormal findings. Up-to-date on immunizations BP well-controlled. Continue current regimen. Follow-up 6 months for CPE.  - POCT HgB A1C - Comp Met  (CMET)  3. Anxiety and depression Declines medication. Encouraged her to try the [employer] app to see if this helps. She is to consider counseling. Will monitor.

## 2017-10-11 NOTE — Telephone Encounter (Signed)
[place] at [facility] at [phone] and spoke to Leslie and requested they send a fax of the patients last eye exam to [place] at [phone] and Luvenia Starch let me know that they had the wrong fax number for our office and hopefully we will receive now.

## 2017-10-11 NOTE — Telephone Encounter (Signed)
Please send a message to [place] her for the message. Can we please call her eye doc office as I have not received anything.

## 2017-10-11 NOTE — Patient Instructions (Signed)
Please go to the lab today for blood work.  I will call you with your results. We will alter treatment regimen(s) if indicated by your results.   Please continue current regimen of medicines. Add more fruits and vegetables to diet. Make sure to eat protein when you have a carb.  Work on increasing fluid intake.   If you note the pulsing sensation again, check BP, record and call me. Normal BP 110-129/60s-80s Worrisome BP > 140/90.

## 2017-10-25 DIAGNOSIS — M9901 Segmental and somatic dysfunction of cervical region: Secondary | ICD-10-CM | POA: Diagnosis not present

## 2017-10-25 DIAGNOSIS — M542 Cervicalgia: Secondary | ICD-10-CM | POA: Diagnosis not present

## 2017-10-25 DIAGNOSIS — M546 Pain in thoracic spine: Secondary | ICD-10-CM | POA: Diagnosis not present

## 2017-10-25 DIAGNOSIS — M9902 Segmental and somatic dysfunction of thoracic region: Secondary | ICD-10-CM | POA: Diagnosis not present

## 2017-11-11 ENCOUNTER — Other Ambulatory Visit: Payer: Self-pay | Admitting: Physician Assistant

## 2017-11-19 ENCOUNTER — Other Ambulatory Visit: Payer: Self-pay | Admitting: Physician Assistant

## 2017-11-19 DIAGNOSIS — E119 Type 2 diabetes mellitus without complications: Secondary | ICD-10-CM

## 2017-11-22 DIAGNOSIS — M9901 Segmental and somatic dysfunction of cervical region: Secondary | ICD-10-CM | POA: Diagnosis not present

## 2017-11-22 DIAGNOSIS — M9902 Segmental and somatic dysfunction of thoracic region: Secondary | ICD-10-CM | POA: Diagnosis not present

## 2017-11-22 DIAGNOSIS — M542 Cervicalgia: Secondary | ICD-10-CM | POA: Diagnosis not present

## 2017-11-22 DIAGNOSIS — M546 Pain in thoracic spine: Secondary | ICD-10-CM | POA: Diagnosis not present

## 2017-12-15 DIAGNOSIS — M9902 Segmental and somatic dysfunction of thoracic region: Secondary | ICD-10-CM | POA: Diagnosis not present

## 2017-12-15 DIAGNOSIS — M542 Cervicalgia: Secondary | ICD-10-CM | POA: Diagnosis not present

## 2017-12-15 DIAGNOSIS — M546 Pain in thoracic spine: Secondary | ICD-10-CM | POA: Diagnosis not present

## 2017-12-15 DIAGNOSIS — M9901 Segmental and somatic dysfunction of cervical region: Secondary | ICD-10-CM | POA: Diagnosis not present

## 2017-12-16 ENCOUNTER — Telehealth: Payer: Self-pay | Admitting: Physician Assistant

## 2017-12-16 NOTE — Telephone Encounter (Signed)
Paperwork placed in folder for PCP to complete.

## 2017-12-16 NOTE — Telephone Encounter (Signed)
Received paperwork, via mail, from [employer].  Requesting the paperwork be reviewed, completed, and mailed back with the attached envelope. Placed in front bin with charge sheet.

## 2017-12-17 NOTE — Telephone Encounter (Signed)
Forms completed and given to CMA to fax in.

## 2017-12-22 ENCOUNTER — Other Ambulatory Visit (HOSPITAL_COMMUNITY)
Admission: RE | Admit: 2017-12-22 | Discharge: 2017-12-22 | Disposition: A | Discharge status: Home / Self Care | Payer: Medicare Other | Source: Ambulatory Visit | Attending: Physician Assistant | Admitting: Physician Assistant

## 2017-12-22 ENCOUNTER — Other Ambulatory Visit: Payer: Self-pay

## 2017-12-22 ENCOUNTER — Encounter: Payer: Self-pay | Admitting: Physician Assistant

## 2017-12-22 ENCOUNTER — Ambulatory Visit (INDEPENDENT_AMBULATORY_CARE_PROVIDER_SITE_OTHER): Payer: Medicare Other | Admitting: Physician Assistant

## 2017-12-22 VITALS — BP 102/70 | HR 81 | Temp 98.0°F | Resp 14 | Ht 64.0 in | Wt 148.0 lb

## 2017-12-22 DIAGNOSIS — R102 Pelvic and perineal pain: Secondary | ICD-10-CM | POA: Diagnosis not present

## 2017-12-22 DIAGNOSIS — F419 Anxiety disorder, unspecified: Secondary | ICD-10-CM | POA: Diagnosis not present

## 2017-12-22 DIAGNOSIS — E119 Type 2 diabetes mellitus without complications: Secondary | ICD-10-CM | POA: Insufficient documentation

## 2017-12-22 DIAGNOSIS — Z7984 Long term (current) use of oral hypoglycemic drugs: Secondary | ICD-10-CM | POA: Diagnosis not present

## 2017-12-22 LAB — POCT URINALYSIS DIPSTICK
BILIRUBIN UA: NEGATIVE
GLUCOSE UA: NEGATIVE
Ketones, UA: NEGATIVE
LEUKOCYTES UA: NEGATIVE
Nitrite, UA: NEGATIVE
PH UA: 7.5 (ref 5.0–8.0)
Protein, UA: NEGATIVE
SPEC GRAV UA: 1.015 (ref 1.010–1.025)
UROBILINOGEN UA: 0.2 U/dL

## 2017-12-22 MED ORDER — FLUCONAZOLE 150 MG PO TABS: 150.0000 mg | ORAL_TABLET | Freq: Once | ORAL | 0 refills | Status: AC

## 2017-12-22 NOTE — Progress Notes (Signed)
Patient presents to clinic today c/o couple of days of suprapubic pressure with dysuria and pelvic discomfort. Denies fever, chills. Denies known discharge. Patient is not currently sexually active. Denies hematuria, urgency or frequency.  Denies change to feminine hygiene products.   Past Medical History:  Diagnosis Date  . Anxiety   . Diabetes mellitus without complication (HCC)    per pt. borderline diabetic  . Fibrocystic disease of breast   . Mood swings     Current Outpatient Medications on File Prior to Visit  Medication Sig Dispense Refill  . ALPRAZolam (XANAX) 0.25 MG tablet TAKE 1 TABLET BY MOUTH EVERY 12 HOURS AS NEEDED FOR ANXIETY 30 tablet 0  . atorvastatin (LIPITOR) 20 MG tablet TAKE 1 TABLET BY MOUTH AT BEDTIME 90 tablet 1  . BD PEN NEEDLE NANO U/F 32G X 4 MM MISC USE DAILY AS DIRECTED 100 each 0  . Blood Glucose Monitoring Suppl (TRUETRACK SMART SYSTEM) KIT 1 kit by Does not apply route daily. Dx: E11.9 1 each 0  . Calcium Carb-Cholecalciferol (CALCIUM-VITAMIN D) 600-400 MG-UNIT TABS Take 2 each by mouth daily.    Marland Kitchen FLUoxetine (PROZAC) 40 MG capsule TAKE 1 CAPSULE(40 MG) BY MOUTH DAILY 90 capsule 1  . metFORMIN (GLUCOPHAGE-XR) 500 MG 24 hr tablet TAKE 2 TABLETS(1000 MG) BY MOUTH TWICE DAILY WITH A MEAL 360 tablet 1  . OVER THE COUNTER MEDICATION Multiple Vitamins-Minerals-Take 1 tablet by mouth daily.    . ramipril (ALTACE) 2.5 MG capsule TAKE 1 CAPSULE(2.5 MG) BY MOUTH DAILY 90 capsule 1  . TRUE METRIX BLOOD GLUCOSE TEST test strip CHECK BLOOD SUGAR ONCE DAILY 100 each 11  . VICTOZA 18 MG/3ML SOPN ADMINISTER 1.2 MG UNDER THE SKIN DAILY 6 mL 6   No current facility-administered medications on file prior to visit.    No Known Allergies  Family History  Problem Relation Age of Onset  . Stroke Mother   . Heart attack Father   . Alzheimer's disease Sister        #1-Deceased  . Breast cancer Sister        #1  . Healthy Sister        #2    Social History    Socioeconomic History  . Marital status: Married    Spouse name: Not on file  . Number of children: 1  . Years of education: Not on file  . Highest education level: Not on file  Occupational History  . Occupation: Statistician  Social Needs  . Financial resource strain: Not on file  . Food insecurity:    Worry: Not on file    Inability: Not on file  . Transportation needs:    Medical: Not on file    Non-medical: Not on file  Tobacco Use  . Smoking status: Never Smoker  . Smokeless tobacco: Never Used  Substance and Sexual Activity  . Alcohol use: Yes    Alcohol/week: 0.0 oz    Comment: occas.  . Drug use: No  . Sexual activity: Yes    Partners: Male  Lifestyle  . Physical activity:    Days per week: Not on file    Minutes per session: Not on file  . Stress: Not on file  Relationships  . Social connections:    Talks on phone: Not on file    Gets together: Not on file    Attends religious service: Not on file    Active member of club or organization: Not on file  Attends meetings of clubs or organizations: Not on file    Relationship status: Not on file  Other Topics Concern  . Not on file  Social History Narrative  . Not on file   Review of Systems - See HPI.  All other ROS are negative.  BP 102/70   Pulse 81   Temp 98 F (36.7 C) (Oral)   Resp 14   Ht 5' 4" (1.626 m)   Wt 148 lb (67.1 kg)   SpO2 98%   BMI 25.40 kg/m   Physical Exam  Constitutional: She appears well-developed and well-nourished.  HENT:  Head: Normocephalic and atraumatic.  Eyes: Conjunctivae are normal.  Neck: Neck supple.  Cardiovascular: Normal rate, regular rhythm and normal heart sounds.  Pulmonary/Chest: Effort normal.  Abdominal: Soft. Bowel sounds are normal. She exhibits no distension and no mass. There is tenderness in the suprapubic area. There is no rebound and no guarding. No hernia.  Genitourinary: Uterus normal. Pelvic exam was performed with patient supine.  There is no tenderness or lesion on the right labia. There is no tenderness or lesion on the left labia. Cervix exhibits no motion tenderness and no discharge. Right adnexum displays no mass and no tenderness. Left adnexum displays no mass and no tenderness. There is erythema and tenderness in the vagina. Vaginal discharge found.  Genitourinary Comments: Chaperone present for examination.  Lymphadenopathy:    She has no cervical adenopathy.  Psychiatric: She has a normal mood and affect.  Vitals reviewed.  Recent Results (from the past 2160 hour(s))  POCT HgB A1C     Status: Normal   Collection Time: 10/11/17 10:24 AM  Result Value Ref Range   Hemoglobin A1C 6.6   Comp Met (CMET)     Status: Abnormal   Collection Time: 10/11/17 10:49 AM  Result Value Ref Range   Sodium 140 135 - 145 mEq/L   Potassium 4.6 3.5 - 5.1 mEq/L   Chloride 105 96 - 112 mEq/L   CO2 26 19 - 32 mEq/L   Glucose, Bld 128 (H) 70 - 99 mg/dL   BUN 16 6 - 23 mg/dL   Creatinine, Ser 0.60 0.40 - 1.20 mg/dL   Total Bilirubin 0.3 0.2 - 1.2 mg/dL   Alkaline Phosphatase 79 39 - 117 U/L   AST 19 0 - 37 U/L   ALT 19 0 - 35 U/L   Total Protein 7.1 6.0 - 8.3 g/dL   Albumin 4.2 3.5 - 5.2 g/dL   Calcium 10.1 8.4 - 10.5 mg/dL   GFR 106.05 >60.00 mL/min  Lipid Profile     Status: None   Collection Time: 10/11/17 10:49 AM  Result Value Ref Range   Cholesterol 138 0 - 200 mg/dL    Comment: ATP III Classification       Desirable:  < 200 mg/dL               Borderline High:  200 - 239 mg/dL          High:  > = 240 mg/dL   Triglycerides 78.0 0.0 - 149.0 mg/dL    Comment: Normal:  <150 mg/dLBorderline High:  150 - 199 mg/dL   HDL 66.60 >39.00 mg/dL   VLDL 15.6 0.0 - 40.0 mg/dL   LDL Cholesterol 56 0 - 99 mg/dL   Total CHOL/HDL Ratio 2     Comment:                Men  Women1/2 Average Risk     3.4          3.3Average Risk          5.0          4.42X Average Risk          9.6          7.13X Average Risk          15.0           11.0                       NonHDL 71.56     Comment: NOTE:  Non-HDL goal should be 30 mg/dL higher than patient's LDL goal (i.e. LDL goal of < 70 mg/dL, would have non-HDL goal of < 100 mg/dL)  POCT Urinalysis Dipstick     Status: Abnormal   Collection Time: 12/22/17  2:44 PM  Result Value Ref Range   Color, UA yellow    Clarity, UA clear    Glucose, UA Negative Negative   Bilirubin, UA negative    Ketones, UA negative    Spec Grav, UA 1.015 1.010 - 1.025   Blood, UA Trace    pH, UA 7.5 5.0 - 8.0   Protein, UA Negative Negative   Urobilinogen, UA 0.2 0.2 or 1.0 E.U./dL   Nitrite, UA negative    Leukocytes, UA Negative Negative   Appearance     Odor     Assessment/Plan: 1. Suprapubic pressure Urine dip unremarkable. Will send for Culture and Ancillary testing (BV, yeast, etc). Irritation and discharge seem consistent with yeast. Will send in Diflucan while awaiting test results.  - POCT Urinalysis Dipstick - fluconazole (DIFLUCAN) 150 MG tablet; Take 1 tablet (150 mg total) by mouth once for 1 dose.  Dispense: 1 tablet; Refill: 0 - Urine Culture - Urine cytology ancillary only   William Cody Martin, PA-C 

## 2017-12-22 NOTE — Patient Instructions (Signed)
Please stay well-hydrated.  Take the Diflucan as directed. We will alter treatment based on lab results.  If labs are unremarkable, I will want you to see gynecology.

## 2017-12-23 ENCOUNTER — Encounter: Payer: Self-pay | Admitting: Physician Assistant

## 2017-12-23 LAB — URINE CULTURE
MICRO NUMBER: 90734685
Result:: NO GROWTH
SPECIMEN QUALITY:: ADEQUATE

## 2017-12-25 LAB — URINE CYTOLOGY ANCILLARY ONLY
Bacterial vaginitis: NEGATIVE
Candida vaginitis: NEGATIVE

## 2017-12-28 ENCOUNTER — Encounter: Payer: Self-pay | Admitting: Emergency Medicine

## 2018-01-03 ENCOUNTER — Other Ambulatory Visit: Payer: Self-pay | Admitting: Physician Assistant

## 2018-01-11 ENCOUNTER — Other Ambulatory Visit: Payer: Self-pay | Admitting: Physician Assistant

## 2018-01-12 DIAGNOSIS — M9902 Segmental and somatic dysfunction of thoracic region: Secondary | ICD-10-CM | POA: Diagnosis not present

## 2018-01-12 DIAGNOSIS — M542 Cervicalgia: Secondary | ICD-10-CM | POA: Diagnosis not present

## 2018-01-12 DIAGNOSIS — M9901 Segmental and somatic dysfunction of cervical region: Secondary | ICD-10-CM | POA: Diagnosis not present

## 2018-01-12 DIAGNOSIS — M546 Pain in thoracic spine: Secondary | ICD-10-CM | POA: Diagnosis not present

## 2018-02-09 DIAGNOSIS — M546 Pain in thoracic spine: Secondary | ICD-10-CM | POA: Diagnosis not present

## 2018-02-09 DIAGNOSIS — M9902 Segmental and somatic dysfunction of thoracic region: Secondary | ICD-10-CM | POA: Diagnosis not present

## 2018-02-09 DIAGNOSIS — M542 Cervicalgia: Secondary | ICD-10-CM | POA: Diagnosis not present

## 2018-02-09 DIAGNOSIS — M9901 Segmental and somatic dysfunction of cervical region: Secondary | ICD-10-CM | POA: Diagnosis not present

## 2018-03-03 ENCOUNTER — Other Ambulatory Visit: Payer: Self-pay | Admitting: Physician Assistant

## 2018-03-03 DIAGNOSIS — E785 Hyperlipidemia, unspecified: Principal | ICD-10-CM

## 2018-03-03 DIAGNOSIS — E1169 Type 2 diabetes mellitus with other specified complication: Secondary | ICD-10-CM

## 2018-03-09 DIAGNOSIS — M546 Pain in thoracic spine: Secondary | ICD-10-CM | POA: Diagnosis not present

## 2018-03-09 DIAGNOSIS — M542 Cervicalgia: Secondary | ICD-10-CM | POA: Diagnosis not present

## 2018-03-09 DIAGNOSIS — M9901 Segmental and somatic dysfunction of cervical region: Secondary | ICD-10-CM | POA: Diagnosis not present

## 2018-03-09 DIAGNOSIS — M9902 Segmental and somatic dysfunction of thoracic region: Secondary | ICD-10-CM | POA: Diagnosis not present

## 2018-03-16 ENCOUNTER — Other Ambulatory Visit: Payer: Self-pay | Admitting: Physician Assistant

## 2018-03-17 IMAGING — MR MR HEAD W/O CM
9 of 10 series · 42 of 48 positions shown · non-contrast
Comparison: Head CT 07/13/2007

CLINICAL DATA: Intermittent sharp stabbing pains in the head, 2
years duration.

EXAM:
MRI HEAD WITHOUT CONTRAST
TECHNIQUE: Multiplanar, multiecho pulse sequences of the brain and surrounding
structures were obtained without intravenous contrast.

[Series 2: T1 · sagittal · 5.0mm · 0.45mm/px · 3 of 23 slices shown]
[im 1/23]
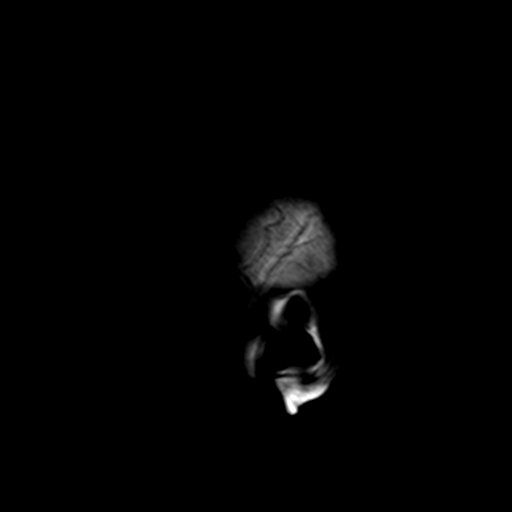
[im 12/23]
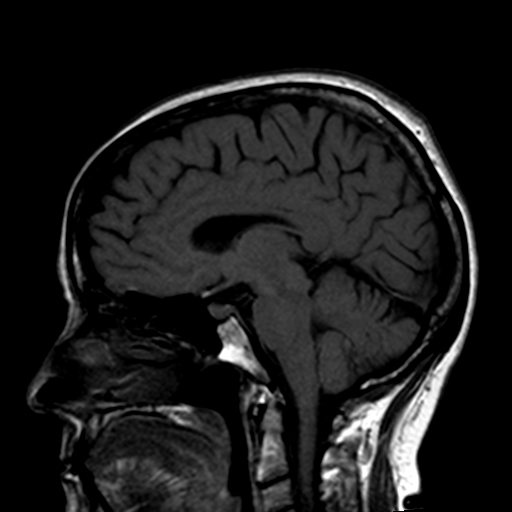
[im 23/23]
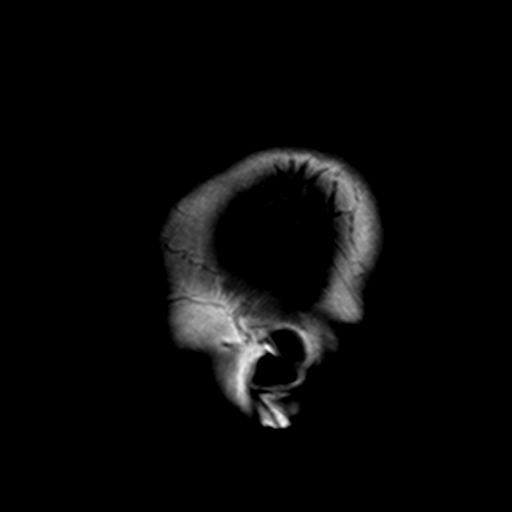

[Series 3: DWI · axial · 3.0mm · 2.19mm/px · z∈[-50,+95]mm · 10 of 90 slices shown (1 of 4)]
[im 1/90]
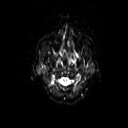
[im 10/90]
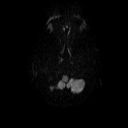
[im 20/90]
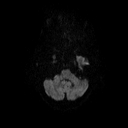
[im 30/90]
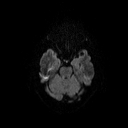
[im 40/90]
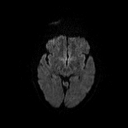
[im 50/90]
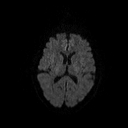
[im 60/90]
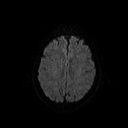
[im 70/90]
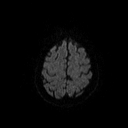
[im 80/90]
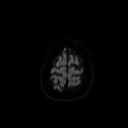
[im 90/90]
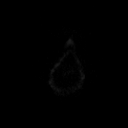

[Series 4: DWI · axial · 3.0mm · 2.19mm/px · z∈[-50,+95]mm · 5 of 45 slices shown (2 of 4)]
[im 1/45]
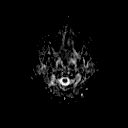
[im 12/45]
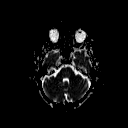
[im 23/45]
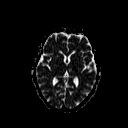
[im 34/45]
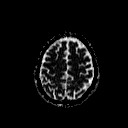
[im 45/45]
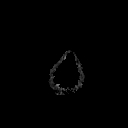

[Series 5: T2 · axial · 5.0mm · 0.45mm/px · z∈[-56,+98]mm · 2 of 23 slices shown (1 of 2)]
[im 1/23]
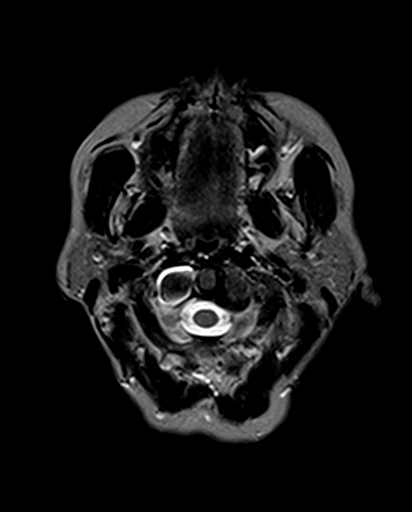
[im 23/23]
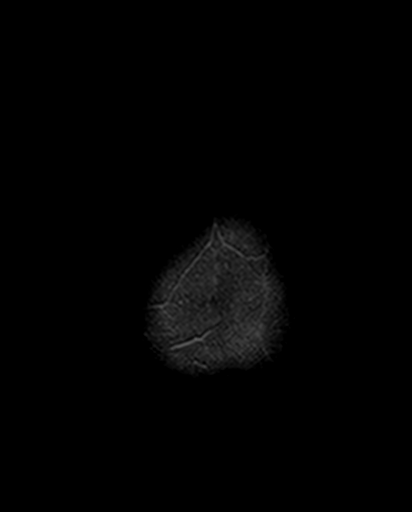

[Series 6: T2 · axial · 5.0mm · 0.45mm/px · z∈[-56,+98]mm · 2 of 23 slices shown (2 of 2)]
[im 1/23]
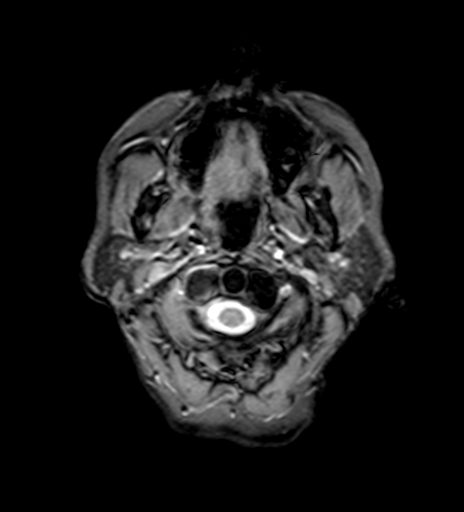
[im 23/23]
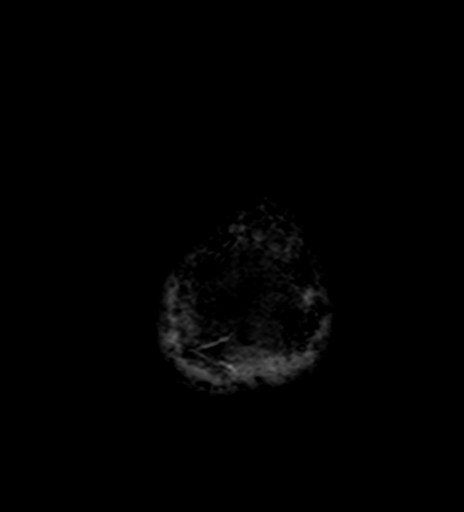

[Series 7: FLAIR · axial · 5.0mm · 0.45mm/px · z∈[-56,+98]mm · 2 of 23 slices shown]
[im 1/23]
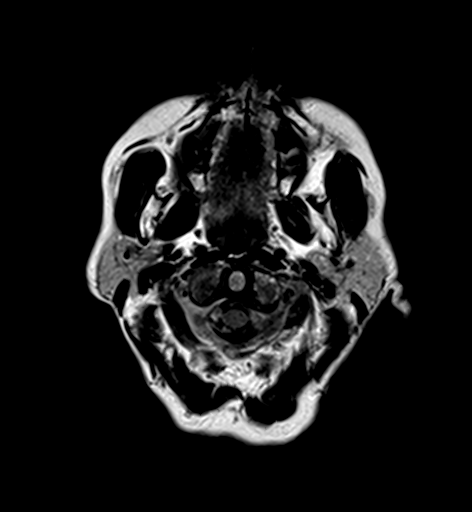
[im 23/23]
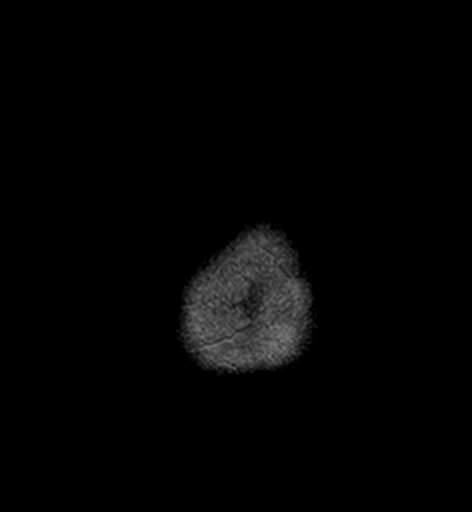

[Series 9: T2 post-contrast · coronal · 5.0mm · 0.45mm/px · 3 of 28 slices shown]
[im 1/28]
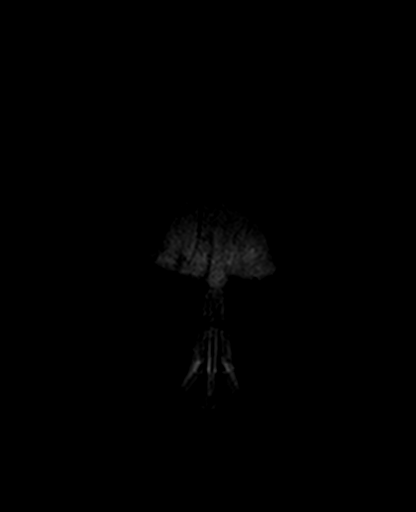
[im 14/28]
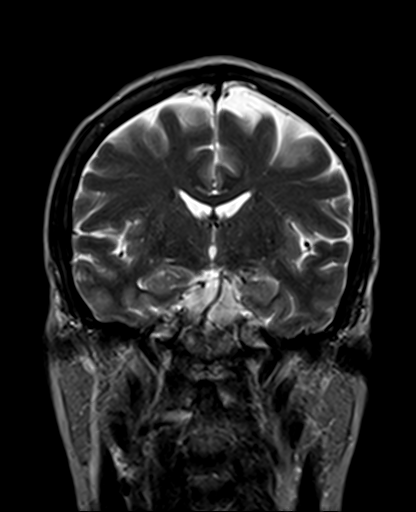
[im 28/28]
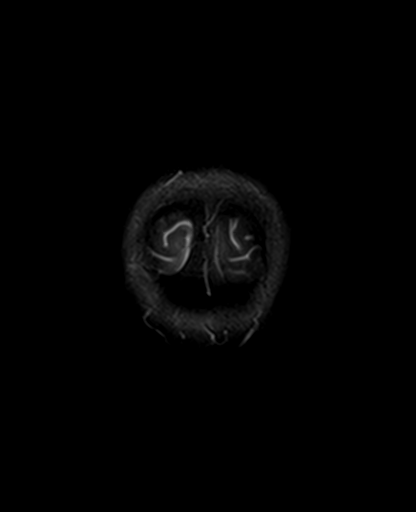

[Series 10: DWI · coronal · 3.0mm · 1.46mm/px · 10 of 90 slices shown (3 of 4)]
[im 1/90]
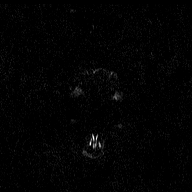
[im 10/90]
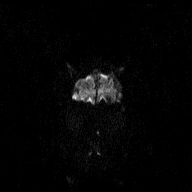
[im 20/90]
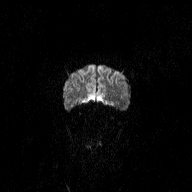
[im 30/90]
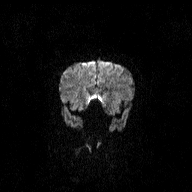
[im 40/90]
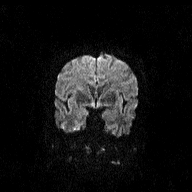
[im 50/90]
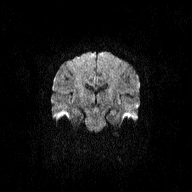
[im 60/90]
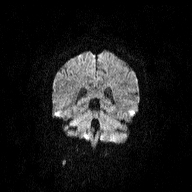
[im 70/90]
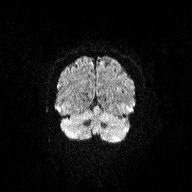
[im 80/90]
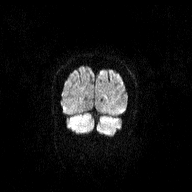
[im 90/90]
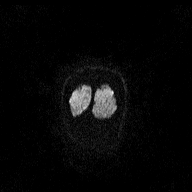

[Series 11: DWI · coronal · 3.0mm · 1.46mm/px · 5 of 45 slices shown (4 of 4)]
[im 1/45]
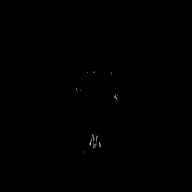
[im 12/45]
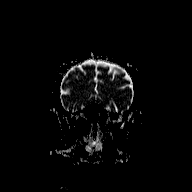
[im 23/45]
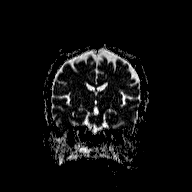
[im 34/45]
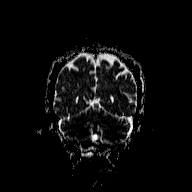
[im 45/45]
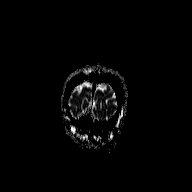

[42 of 48 positions shown; findings below may reference images not displayed]

FINDINGS: Diffusion imaging does not show any acute or subacute infarction.
There is an old small vessel infarction in the right side of the
pons. No cerebellar abnormality. Cerebral hemispheres are normal
without evidence of previous vascular insult. No mass lesion,
hemorrhage, hydrocephalus or extra-axial collection. No pituitary
mass. No inflammatory sinus disease. There is a joint effusion on
the right at the articulation of C1 and C2 which could be a cause of
pain.
IMPRESSION: No significant intracranial finding. Normal appearance of the brain
except for tiny focus of T2 signal on the right side of the pons
consistent with an old small vessel insult. No intracranial cause of
sharp stabbing pain identified.

Arthropathy on the right at the C1-2 articulation with a joint
effusion. This could be symptomatic.

## 2018-03-21 ENCOUNTER — Other Ambulatory Visit: Payer: Self-pay | Admitting: Physician Assistant

## 2018-04-01 ENCOUNTER — Other Ambulatory Visit: Payer: Self-pay | Admitting: Physician Assistant

## 2018-04-01 DIAGNOSIS — R079 Chest pain, unspecified: Secondary | ICD-10-CM | POA: Diagnosis not present

## 2018-04-01 DIAGNOSIS — R072 Precordial pain: Secondary | ICD-10-CM | POA: Diagnosis not present

## 2018-04-04 DIAGNOSIS — R079 Chest pain, unspecified: Secondary | ICD-10-CM | POA: Diagnosis not present

## 2018-04-11 ENCOUNTER — Encounter: Payer: Self-pay | Admitting: Physician Assistant

## 2018-04-11 ENCOUNTER — Other Ambulatory Visit: Payer: Self-pay

## 2018-04-11 ENCOUNTER — Ambulatory Visit (INDEPENDENT_AMBULATORY_CARE_PROVIDER_SITE_OTHER): Payer: Medicare Other | Admitting: Physician Assistant

## 2018-04-11 VITALS — BP 102/78 | HR 81 | Temp 97.6°F | Resp 14 | Ht 63.0 in | Wt 158.0 lb

## 2018-04-11 DIAGNOSIS — F32A Depression, unspecified: Secondary | ICD-10-CM

## 2018-04-11 DIAGNOSIS — Z23 Encounter for immunization: Secondary | ICD-10-CM

## 2018-04-11 DIAGNOSIS — E119 Type 2 diabetes mellitus without complications: Secondary | ICD-10-CM

## 2018-04-11 DIAGNOSIS — F329 Major depressive disorder, single episode, unspecified: Secondary | ICD-10-CM | POA: Diagnosis not present

## 2018-04-11 DIAGNOSIS — Z0000 Encounter for general adult medical examination without abnormal findings: Secondary | ICD-10-CM

## 2018-04-11 DIAGNOSIS — F419 Anxiety disorder, unspecified: Secondary | ICD-10-CM | POA: Diagnosis not present

## 2018-04-11 DIAGNOSIS — E1169 Type 2 diabetes mellitus with other specified complication: Secondary | ICD-10-CM | POA: Diagnosis not present

## 2018-04-11 DIAGNOSIS — E785 Hyperlipidemia, unspecified: Secondary | ICD-10-CM

## 2018-04-11 LAB — CBC WITH DIFFERENTIAL/PLATELET
BASOS ABS: 0.2 10*3/uL — AB (ref 0.0–0.1)
Basophils Relative: 2.1 % (ref 0.0–3.0)
Eosinophils Absolute: 0.6 10*3/uL (ref 0.0–0.7)
Eosinophils Relative: 6.6 % — ABNORMAL HIGH (ref 0.0–5.0)
HCT: 41.6 % (ref 36.0–46.0)
Hemoglobin: 14.1 g/dL (ref 12.0–15.0)
LYMPHS ABS: 2.2 10*3/uL (ref 0.7–4.0)
Lymphocytes Relative: 23.2 % (ref 12.0–46.0)
MCHC: 34 g/dL (ref 30.0–36.0)
MCV: 88.5 fl (ref 78.0–100.0)
MONO ABS: 0.7 10*3/uL (ref 0.1–1.0)
MONOS PCT: 7.9 % (ref 3.0–12.0)
NEUTROS ABS: 5.7 10*3/uL (ref 1.4–7.7)
NEUTROS PCT: 60.2 % (ref 43.0–77.0)
PLATELETS: 334 10*3/uL (ref 150.0–400.0)
RBC: 4.7 Mil/uL (ref 3.87–5.11)
RDW: 13.2 % (ref 11.5–15.5)
WBC: 9.4 10*3/uL (ref 4.0–10.5)

## 2018-04-11 LAB — LIPID PANEL
CHOL/HDL RATIO: 2
Cholesterol: 155 mg/dL (ref 0–200)
HDL: 64.3 mg/dL (ref >39.00)
LDL Cholesterol: 66 mg/dL (ref 0–99)
NONHDL: 90.61
Triglycerides: 124 mg/dL (ref 0.0–149.0)
VLDL: 24.8 mg/dL (ref 0.0–40.0)

## 2018-04-11 LAB — COMPREHENSIVE METABOLIC PANEL
ALT: 61 U/L — AB (ref 0–35)
AST: 31 U/L (ref 0–37)
Albumin: 4.1 g/dL (ref 3.5–5.2)
Alkaline Phosphatase: 134 U/L — ABNORMAL HIGH (ref 39–117)
BUN: 15 mg/dL (ref 6–23)
CO2: 29 meq/L (ref 19–32)
Calcium: 9.9 mg/dL (ref 8.4–10.5)
Chloride: 104 mEq/L (ref 96–112)
Creatinine, Ser: 0.71 mg/dL (ref 0.40–1.20)
GFR: 87.19 mL/min (ref >60.00)
GLUCOSE: 203 mg/dL — AB (ref 70–99)
POTASSIUM: 4.2 meq/L (ref 3.5–5.1)
SODIUM: 141 meq/L (ref 135–145)
TOTAL PROTEIN: 6.9 g/dL (ref 6.0–8.3)
Total Bilirubin: 0.3 mg/dL (ref 0.2–1.2)

## 2018-04-11 LAB — MICROALBUMIN / CREATININE URINE RATIO
Creatinine,U: 197.3 mg/dL
Microalb Creat Ratio: 0.6 mg/g (ref 0.0–30.0)
Microalb, Ur: 1.3 mg/dL (ref 0.0–1.9)

## 2018-04-11 LAB — HEMOGLOBIN A1C: HEMOGLOBIN A1C: 7.8 % — AB (ref 4.6–6.5)

## 2018-04-11 MED ORDER — SERTRALINE HCL 100 MG PO TABS: 100.0000 mg | ORAL_TABLET | Freq: Every day | ORAL | 3 refills | Status: DC

## 2018-04-11 NOTE — Patient Instructions (Signed)
Please go to the lab for blood work.  Our office will call you with your results unless you have chosen to receive results via MyChart. If your blood work is normal we will follow-up each year for physicals and as scheduled for chronic medical problems. If anything is abnormal we will treat accordingly and get you in for a follow-up.  Stop the Fluoxetine and start the Sertraline daily.  Follow-up in 3-4 weeks for reassessment of your mood.     Preventive Care 9 Years and Older, Female Preventive care refers to lifestyle choices and visits with your health care provider that can promote health and wellness. What does preventive care include?  A yearly physical exam. This is also called an annual well check.  Dental exams once or twice a year.  Routine eye exams. Ask your health care provider how often you should have your eyes checked.  Personal lifestyle choices, including: ? Daily care of your teeth and gums. ? Regular physical activity. ? Eating a healthy diet. ? Avoiding tobacco and drug use. ? Limiting alcohol use. ? Practicing safe sex. ? Taking low-dose aspirin every day. ? Taking vitamin and mineral supplements as recommended by your health care provider. What happens during an annual well check? The services and screenings done by your health care provider during your annual well check will depend on your age, overall health, lifestyle risk factors, and family history of disease. Counseling Your health care provider may ask you questions about your:  Alcohol use.  Tobacco use.  Drug use.  Emotional well-being.  Home and relationship well-being.  Sexual activity.  Eating habits.  History of falls.  Memory and ability to understand (cognition).  Work and work Statistician.  Reproductive health.  Screening You may have the following tests or measurements:  Height, weight, and BMI.  Blood pressure.  Lipid and cholesterol levels. These may be checked  every 5 years, or more frequently if you are over 77 years old.  Skin check.  Lung cancer screening. You may have this screening every year starting at age 86 if you have a 30-pack-year history of smoking and currently smoke or have quit within the past 15 years.  Fecal occult blood test (FOBT) of the stool. You may have this test every year starting at age 99.  Flexible sigmoidoscopy or colonoscopy. You may have a sigmoidoscopy every 5 years or a colonoscopy every 10 years starting at age 59.  Hepatitis C blood test.  Hepatitis B blood test.  Sexually transmitted disease (STD) testing.  Diabetes screening. This is done by checking your blood sugar (glucose) after you have not eaten for a while (fasting). You may have this done every 1-3 years.  Bone density scan. This is done to screen for osteoporosis. You may have this done starting at age 6.  Mammogram. This may be done every 1-2 years. Talk to your health care provider about how often you should have regular mammograms.  Talk with your health care provider about your test results, treatment options, and if necessary, the need for more tests. Vaccines Your health care provider may recommend certain vaccines, such as:  Influenza vaccine. This is recommended every year.  Tetanus, diphtheria, and acellular pertussis (Tdap, Td) vaccine. You may need a Td booster every 10 years.  Varicella vaccine. You may need this if you have not been vaccinated.  Zoster vaccine. You may need this after age 77.  Measles, mumps, and rubella (MMR) vaccine. You may need at least  one dose of MMR if you were born in 1957 or later. You may also need a second dose.  Pneumococcal 13-valent conjugate (PCV13) vaccine. One dose is recommended after age 82.  Pneumococcal polysaccharide (PPSV23) vaccine. One dose is recommended after age 88.  Meningococcal vaccine. You may need this if you have certain conditions.  Hepatitis A vaccine. You may need  this if you have certain conditions or if you travel or work in places where you may be exposed to hepatitis A.  Hepatitis B vaccine. You may need this if you have certain conditions or if you travel or work in places where you may be exposed to hepatitis B.  Haemophilus influenzae type b (Hib) vaccine. You may need this if you have certain conditions.  Talk to your health care provider about which screenings and vaccines you need and how often you need them. This information is not intended to replace advice given to you by your health care provider. Make sure you discuss any questions you have with your health care provider. Document Released: 07/19/2015 Document Revised: 03/11/2016 Document Reviewed: 04/23/2015 Elsevier Interactive Patient Education  [employer].

## 2018-04-11 NOTE — Progress Notes (Signed)
Subjective:   Tonya Tapia is a 67 y.o. female who presents for Medicare Annual (Subsequent) preventive examination.  Patient with history of diabetes mellitus II, previously well-controlled without complication. Eye examination and foot examination is up-to-date. Due for flu shot which she agrees to get today. Is currently on a regimen of Metformin and Victoza. Endorses taking all medications as directed. Is checking fasting glucose levels, noting them to be usually < 150 max.   Patient also with longstanding history of anxiety and depression. Husband recently diagnosed with stage 4 lung cancer. Has started treatment and currently undergoing immunotherapy. Notes this has had a profound effect on her mood. Is affecting sleep and appetite. Notes being tearful. Is praying and reading her bible which helps. Has been on Prozac > 10 years. Does not feel it is really helping anymore. Denies SI/HI.  Review of Systems:  Review of Systems  Constitutional: Negative for fever and weight loss.  HENT: Negative for ear discharge, ear pain, hearing loss and tinnitus.   Eyes: Negative for blurred vision, double vision, photophobia and pain.  Respiratory: Negative for cough and shortness of breath.   Cardiovascular: Negative for chest pain and palpitations.  Gastrointestinal: Negative for abdominal pain, blood in stool, constipation, diarrhea, heartburn, melena, nausea and vomiting.  Genitourinary: Negative for dysuria, flank pain, frequency, hematuria and urgency.  Musculoskeletal: Negative for falls.  Neurological: Negative for dizziness, loss of consciousness and headaches.  Endo/Heme/Allergies: Negative for environmental allergies.  Psychiatric/Behavioral: Negative for depression, hallucinations, substance abuse and suicidal ideas. The patient is nervous/anxious. The patient does not have insomnia.     Objective:     Vitals: BP 102/78   Pulse 81   Temp 97.6 F (36.4 C) (Oral)   Resp 14   Ht  '5\' 3"'$  (1.6 m)   Wt 158 lb (71.7 kg)   SpO2 98%   BMI 27.99 kg/m   Body mass index is 27.99 kg/m.  Advanced Directives 04/11/2018 04/06/2017 04/06/2017 04/02/2017 04/02/2017  Does Patient Have a Medical Advance Directive? Yes No No No No  Type of Paramedic of [place];Living will - - - -  Copy of [place] in Chart? No - copy requested - - - -  Would patient like information on creating a medical advance directive? - No - Patient declined No - Patient declined No - Patient declined -    Tobacco Social History   Tobacco Use  Smoking Status Never Smoker  Smokeless Tobacco Never Used     Counseling given: Yes   Clinical Intake:  Pre-visit preparation completed: No  Pain : No/denies pain     BMI - recorded: 27.99 Nutritional Status: BMI 25 -29 Overweight Nutritional Risks: None Diabetes: Yes  How often do you need to have someone help you when you read instructions, pamphlets, or other written materials from your doctor or pharmacy?: 1 - Never  Interpreter Needed?: No     Past Medical History:  Diagnosis Date  . Anxiety   . Diabetes mellitus without complication (HCC)    per pt. borderline diabetic  . Fibrocystic disease of breast   . Mood swings    Past Surgical History:  Procedure Laterality Date  . BREAST BIOPSY  1989  . CHOLECYSTECTOMY N/A 04/06/2017   Procedure: LAPAROSCOPIC CHOLECYSTECTOMY WITH INTRAOPERATIVE CHOLANGIOGRAM;  Surgeon: Armandina Gemma, MD;  Location: [facility];  Service: General;  Laterality: N/A;  . ERCP N/A 04/05/2017   Procedure: ENDOSCOPIC RETROGRADE CHOLANGIOPANCREATOGRAPHY (ERCP);  Surgeon: Fuller Plan,  Pricilla Riffle, MD;  Location: [facility];  Service: Endoscopy;  Laterality: N/A;  . FOOT SURGERY  1991  . PILONIDAL CYST EXCISION  1971  . THYROIDECTOMY Right 2005  . TONSILLECTOMY     Family History  Problem Relation Age of Onset  . Stroke Mother   . Heart attack Father   . Alzheimer's disease Sister         #1-Deceased  . Breast cancer Sister        #1  . Healthy Sister        #2   Social History   Socioeconomic History  . Marital status: Married    Spouse name: Not on file  . Number of children: 1  . Years of education: Not on file  . Highest education level: Not on file  Occupational History  . Occupation: Statistician  Social Needs  . Financial resource strain: Not on file  . Food insecurity:    Worry: Not on file    Inability: Not on file  . Transportation needs:    Medical: Not on file    Non-medical: Not on file  Tobacco Use  . Smoking status: Never Smoker  . Smokeless tobacco: Never Used  Substance and Sexual Activity  . Alcohol use: Yes    Alcohol/week: 0.0 standard drinks    Comment: occas.  . Drug use: No  . Sexual activity: Yes    Partners: Male  Lifestyle  . Physical activity:    Days per week: Not on file    Minutes per session: Not on file  . Stress: Not on file  Relationships  . Social connections:    Talks on phone: Not on file    Gets together: Not on file    Attends religious service: Not on file    Active member of club or organization: Not on file    Attends meetings of clubs or organizations: Not on file    Relationship status: Not on file  Other Topics Concern  . Not on file  Social History Narrative  . Not on file    Outpatient Encounter Medications as of 04/11/2018  Medication Sig  . ALPRAZolam (XANAX) 0.25 MG tablet TAKE 1 TABLET BY MOUTH EVERY 12 HOURS AS NEEDED FOR ANXIETY  . atorvastatin (LIPITOR) 20 MG tablet TAKE 1 TABLET BY MOUTH AT BEDTIME  . BD PEN NEEDLE NANO U/F 32G X 4 MM MISC USE DAILY AS DIRECTED  . Blood Glucose Monitoring Suppl ([place]) KIT 1 kit by Does not apply route daily. Dx: E11.9  . Calcium Carb-Cholecalciferol (CALCIUM-VITAMIN D) 600-400 MG-UNIT TABS Take 2 each by mouth daily.  Marland Kitchen FLUoxetine (PROZAC) 40 MG capsule TAKE 1 CAPSULE(40 MG) BY MOUTH DAILY  . metFORMIN (GLUCOPHAGE-XR) 500 MG 24 hr  tablet TAKE 2 TABLETS(1000 MG) BY MOUTH TWICE DAILY WITH A MEAL  . OVER THE COUNTER MEDICATION Multiple Vitamins-Minerals-Take 1 tablet by mouth daily.  . ramipril (ALTACE) 2.5 MG capsule TAKE 1 CAPSULE(2.5 MG) BY MOUTH DAILY  . TRUE METRIX BLOOD GLUCOSE TEST test strip CHECK BLOOD SUGAR ONCE DAILY  . VICTOZA 18 MG/3ML SOPN ADMINISTER 1.2 MG UNDER THE SKIN DAILY   No facility-administered encounter medications on file as of 04/11/2018.     Activities of Daily Living In your present state of health, do you have any difficulty performing the following activities: 04/11/2018  Hearing? N  Vision? N  Difficulty concentrating or making decisions? N  Walking or climbing stairs? N  Dressing or  bathing? N  Doing errands, shopping? N  Preparing Food and eating ? N  Using the Toilet? N  In the past six months, have you accidently leaked urine? N  Do you have problems with loss of bowel control? N  Managing your Medications? N  Managing your Finances? N  Housekeeping or managing your Housekeeping? N  Some recent data might be hidden    Patient Care Team: Delorse Limber as PCP - General (Physician Assistant)    Assessment:   This is a routine wellness examination for Tonya Tapia.  Exercise Activities and Dietary recommendations Current Exercise Habits: The patient does not participate in regular exercise at present  Goals   None     Fall Risk Fall Risk  04/11/2018 10/11/2017 06/15/2016 12/10/2015 01/29/2015  Falls in the past year? No No No No No   Is the patient's home free of loose throw rugs in walkways, pet beds, electrical cords, etc?   yes      Grab bars in the bathroom? no      Handrails on the stairs?   yes      Adequate lighting?   yes  Timed Get Up and Go performed: Normal  Depression Screen PHQ 2/9 Scores 04/11/2018 12/22/2017 10/11/2017 04/22/2017  PHQ - 2 Score 2 0 0 1  PHQ- 9 Score 4 0 - 2     Cognitive Function MMSE - Mini Mental State Exam 04/11/2018  Orientation  to time 5  Orientation to Place 5  Registration 3  Attention/ Calculation 5  Recall 3  Language- name 2 objects 2  Language- repeat 1  Language- follow 3 step command 3  Language- read & follow direction 1  Write a sentence 1  Copy design 1  Total score 30        Immunization History  Administered Date(s) Administered  . Influenza Whole 03/19/2010  . Influenza, High Dose Seasonal PF 08/05/2016, 04/22/2017  . PPD Test 12/13/2013  . Pneumococcal Conjugate-13 03/16/2016  . Pneumococcal Polysaccharide-23 04/22/2017  . Td 07/06/1998, 01/02/2009    Qualifies for Shingles Vaccine? No  Screening Tests Health Maintenance  Topic Date Due  . INFLUENZA VACCINE  02/03/2018  . DTaP/Tdap/Td (1 - Tdap) 10/12/2018 (Originally 01/03/2009)  . MAMMOGRAM  04/12/2019 (Originally 12/09/2017)  . HEMOGLOBIN A1C  04/12/2018  . OPHTHALMOLOGY EXAM  07/06/2018  . FOOT EXAM  10/12/2018  . TETANUS/TDAP  01/03/2019  . COLONOSCOPY  02/01/2019  . DEXA SCAN  Completed  . Hepatitis C Screening  Completed  . PNA vac Low Risk Adult  Completed    Cancer Screenings: Lung: Low Dose CT Chest recommended if Age 25-80 years, 30 pack-year currently smoking OR have quit w/in 15years. Patient does not qualify. Breast:  Up to date on Mammogram? No  Declines Up to date of Bone Density/Dexa? Yes Colorectal: up-to-date  Additional Screenings: Hepatitis C Screening:  Declines     Plan:  1. Encounter for Medicare annual wellness exam Subsequent. Depression screen negative. Health Maintenance reviewed. Preventive schedule discussed and handout given in AVS. Will obtain fasting labs today.   2. Type 2 diabetes mellitus without complication, without long-term current use of insulin (HCC) Previously controlled. Flu shot updated today. Will repeat CMP, A1C, urine microalbumin and lipid panel. Foot and eye examination up-to-date. Will alter regimen according to results.  - Lipid panel - Hemoglobin A1c - Urine  Microalbumin w/creat. ratio  3. Hyperlipidemia associated with type 2 diabetes mellitus ([place]) Taking medication as directed. Is trying  to keep a well-balanced diet. Repeat labs today. - CBC with Differential/Platelet - Comprehensive metabolic panel - Lipid panel - Hemoglobin A1c  4. Anxiety and depression Will stop Fluoxetine and switch to 100 mg Sertraline. Close follow-up scheduled.  - sertraline (ZOLOFT) 100 MG tablet; Take 1 tablet (100 mg total) by mouth daily.  Dispense: 30 tablet; Refill: 3  5. Encounter for immunization High-dose flu shot given today.  - Flu vaccine HIGH DOSE PF   I have personally reviewed and noted the following in the patient's chart:   . Medical and social history . Use of alcohol, tobacco or illicit drugs  . Current medications and supplements . Functional ability and status . Nutritional status . Physical activity . Advanced directives . List of other physicians . Hospitalizations, surgeries, and ER visits in previous 12 months . Vitals . Screenings to include cognitive, depression, and falls . Referrals and appointments  In addition, I have reviewed and discussed with patient certain preventive protocols, quality metrics, and best practice recommendations. A written personalized care plan for preventive services as well as general preventive health recommendations were provided to patient.     Leeanne Rio, PA-C  04/11/2018

## 2018-04-13 ENCOUNTER — Other Ambulatory Visit (INDEPENDENT_AMBULATORY_CARE_PROVIDER_SITE_OTHER): Payer: Medicare Other

## 2018-04-13 ENCOUNTER — Other Ambulatory Visit: Payer: Self-pay | Admitting: Family Medicine

## 2018-04-13 DIAGNOSIS — M9901 Segmental and somatic dysfunction of cervical region: Secondary | ICD-10-CM | POA: Diagnosis not present

## 2018-04-13 DIAGNOSIS — M546 Pain in thoracic spine: Secondary | ICD-10-CM | POA: Diagnosis not present

## 2018-04-13 DIAGNOSIS — E559 Vitamin D deficiency, unspecified: Secondary | ICD-10-CM

## 2018-04-13 DIAGNOSIS — M9902 Segmental and somatic dysfunction of thoracic region: Secondary | ICD-10-CM | POA: Diagnosis not present

## 2018-04-13 DIAGNOSIS — M542 Cervicalgia: Secondary | ICD-10-CM | POA: Diagnosis not present

## 2018-04-13 LAB — VITAMIN D 25 HYDROXY (VIT D DEFICIENCY, FRACTURES): VITD: 42.06 ng/mL (ref 30.00–100.00)

## 2018-04-13 NOTE — Telephone Encounter (Signed)
Last OV 04/11/18 Alprazolam last filled 06/28/17 #30 with 0

## 2018-04-18 ENCOUNTER — Other Ambulatory Visit: Payer: Self-pay | Admitting: Physician Assistant

## 2018-04-18 DIAGNOSIS — R748 Abnormal levels of other serum enzymes: Secondary | ICD-10-CM

## 2018-05-02 ENCOUNTER — Other Ambulatory Visit (INDEPENDENT_AMBULATORY_CARE_PROVIDER_SITE_OTHER): Payer: Medicare Other

## 2018-05-02 ENCOUNTER — Other Ambulatory Visit: Payer: Self-pay | Admitting: Physician Assistant

## 2018-05-02 DIAGNOSIS — R748 Abnormal levels of other serum enzymes: Secondary | ICD-10-CM

## 2018-05-02 LAB — HEPATIC FUNCTION PANEL
ALK PHOS: 87 U/L (ref 39–117)
ALT: 25 U/L (ref 0–35)
AST: 17 U/L (ref 0–37)
Albumin: 4.1 g/dL (ref 3.5–5.2)
BILIRUBIN DIRECT: 0 mg/dL (ref 0.0–0.3)
BILIRUBIN TOTAL: 0.3 mg/dL (ref 0.2–1.2)
TOTAL PROTEIN: 6.4 g/dL (ref 6.0–8.3)

## 2018-05-11 DIAGNOSIS — M9901 Segmental and somatic dysfunction of cervical region: Secondary | ICD-10-CM | POA: Diagnosis not present

## 2018-05-11 DIAGNOSIS — M9902 Segmental and somatic dysfunction of thoracic region: Secondary | ICD-10-CM | POA: Diagnosis not present

## 2018-05-11 DIAGNOSIS — M542 Cervicalgia: Secondary | ICD-10-CM | POA: Diagnosis not present

## 2018-05-11 DIAGNOSIS — M546 Pain in thoracic spine: Secondary | ICD-10-CM | POA: Diagnosis not present

## 2018-05-25 ENCOUNTER — Other Ambulatory Visit: Payer: Self-pay | Admitting: Physician Assistant

## 2018-05-25 ENCOUNTER — Other Ambulatory Visit: Payer: Self-pay | Admitting: Emergency Medicine

## 2018-05-25 DIAGNOSIS — E785 Hyperlipidemia, unspecified: Principal | ICD-10-CM

## 2018-05-25 DIAGNOSIS — E1169 Type 2 diabetes mellitus with other specified complication: Secondary | ICD-10-CM

## 2018-05-25 MED ORDER — ATORVASTATIN CALCIUM 20 MG PO TABS: 20.0000 mg | ORAL_TABLET | Freq: Every day | ORAL | 1 refills | Status: DC

## 2018-06-08 DIAGNOSIS — M9902 Segmental and somatic dysfunction of thoracic region: Secondary | ICD-10-CM | POA: Diagnosis not present

## 2018-06-08 DIAGNOSIS — M546 Pain in thoracic spine: Secondary | ICD-10-CM | POA: Diagnosis not present

## 2018-06-08 DIAGNOSIS — M9901 Segmental and somatic dysfunction of cervical region: Secondary | ICD-10-CM | POA: Diagnosis not present

## 2018-06-08 DIAGNOSIS — M542 Cervicalgia: Secondary | ICD-10-CM | POA: Diagnosis not present

## 2018-06-17 ENCOUNTER — Encounter: Payer: Self-pay | Admitting: Physician Assistant

## 2018-07-18 ENCOUNTER — Encounter: Payer: Self-pay | Admitting: Emergency Medicine

## 2018-07-18 DIAGNOSIS — E119 Type 2 diabetes mellitus without complications: Secondary | ICD-10-CM | POA: Diagnosis not present

## 2018-07-18 LAB — HM DIABETES EYE EXAM

## 2018-07-20 DIAGNOSIS — M9902 Segmental and somatic dysfunction of thoracic region: Secondary | ICD-10-CM | POA: Diagnosis not present

## 2018-07-20 DIAGNOSIS — M546 Pain in thoracic spine: Secondary | ICD-10-CM | POA: Diagnosis not present

## 2018-07-20 DIAGNOSIS — M542 Cervicalgia: Secondary | ICD-10-CM | POA: Diagnosis not present

## 2018-07-20 DIAGNOSIS — M9901 Segmental and somatic dysfunction of cervical region: Secondary | ICD-10-CM | POA: Diagnosis not present

## 2018-07-31 ENCOUNTER — Other Ambulatory Visit: Payer: Self-pay | Admitting: Physician Assistant

## 2018-07-31 DIAGNOSIS — F32A Depression, unspecified: Secondary | ICD-10-CM

## 2018-07-31 DIAGNOSIS — F329 Major depressive disorder, single episode, unspecified: Secondary | ICD-10-CM

## 2018-07-31 DIAGNOSIS — F419 Anxiety disorder, unspecified: Principal | ICD-10-CM

## 2018-08-10 DIAGNOSIS — M546 Pain in thoracic spine: Secondary | ICD-10-CM | POA: Diagnosis not present

## 2018-08-10 DIAGNOSIS — M9902 Segmental and somatic dysfunction of thoracic region: Secondary | ICD-10-CM | POA: Diagnosis not present

## 2018-08-10 DIAGNOSIS — M9901 Segmental and somatic dysfunction of cervical region: Secondary | ICD-10-CM | POA: Diagnosis not present

## 2018-08-10 DIAGNOSIS — M542 Cervicalgia: Secondary | ICD-10-CM | POA: Diagnosis not present

## 2018-08-18 ENCOUNTER — Other Ambulatory Visit: Payer: Self-pay | Admitting: Physician Assistant

## 2018-09-07 ENCOUNTER — Other Ambulatory Visit: Payer: Self-pay | Admitting: Physician Assistant

## 2018-09-07 DIAGNOSIS — E785 Hyperlipidemia, unspecified: Secondary | ICD-10-CM

## 2018-09-07 DIAGNOSIS — F329 Major depressive disorder, single episode, unspecified: Secondary | ICD-10-CM

## 2018-09-07 DIAGNOSIS — E1169 Type 2 diabetes mellitus with other specified complication: Secondary | ICD-10-CM

## 2018-09-07 DIAGNOSIS — F419 Anxiety disorder, unspecified: Principal | ICD-10-CM

## 2018-09-07 DIAGNOSIS — M546 Pain in thoracic spine: Secondary | ICD-10-CM | POA: Diagnosis not present

## 2018-09-07 DIAGNOSIS — M542 Cervicalgia: Secondary | ICD-10-CM | POA: Diagnosis not present

## 2018-09-07 DIAGNOSIS — M9901 Segmental and somatic dysfunction of cervical region: Secondary | ICD-10-CM | POA: Diagnosis not present

## 2018-09-07 DIAGNOSIS — M9902 Segmental and somatic dysfunction of thoracic region: Secondary | ICD-10-CM | POA: Diagnosis not present

## 2018-09-07 MED ORDER — ATORVASTATIN CALCIUM 20 MG PO TABS: 20.0000 mg | ORAL_TABLET | Freq: Every day | ORAL | 2 refills | Status: DC

## 2018-09-07 MED ORDER — METFORMIN HCL ER 500 MG PO TB24: ORAL_TABLET | ORAL | 1 refills | Status: DC

## 2018-09-07 MED ORDER — RAMIPRIL 2.5 MG PO CAPS: ORAL_CAPSULE | ORAL | 1 refills | Status: DC

## 2018-09-07 MED ORDER — SERTRALINE HCL 100 MG PO TABS: ORAL_TABLET | ORAL | 1 refills | Status: DC

## 2018-09-07 MED ORDER — LIRAGLUTIDE 18 MG/3ML ~~LOC~~ SOPN: PEN_INJECTOR | SUBCUTANEOUS | 2 refills | Status: DC

## 2018-09-07 MED ORDER — INSULIN PEN NEEDLE 32G X 4 MM MISC: 3 refills | Status: DC

## 2018-09-07 NOTE — Telephone Encounter (Signed)
Copied from [place] [id number]. Topic: Quick Communication - Rx Refill/Question >> Sep 07, 2018  2:51 PM Keene Breath wrote: Medication:   atorvastatin (LIPITOR) 20 MG tablet sertraline (ZOLOFT) 100 MG tablet  ramipril (ALTACE) 2.5 MG capsule  metFORMIN (GLUCOPHAGE-XR) 500 MG 24 hr tablet VICTOZA 18 MG/3ML SOPN BD PEN NEEDLE NANO U/F 32G X 4 MM MISC   Patient called to request a refill for the above medications  Preferred Pharmacy (with phone number or street name): [place], [place] - [place] [phone] (Phone) [phone] (Fax)

## 2018-09-14 ENCOUNTER — Encounter: Payer: Self-pay | Admitting: Physician Assistant

## 2018-09-14 DIAGNOSIS — M546 Pain in thoracic spine: Secondary | ICD-10-CM | POA: Diagnosis not present

## 2018-09-14 DIAGNOSIS — M9902 Segmental and somatic dysfunction of thoracic region: Secondary | ICD-10-CM | POA: Diagnosis not present

## 2018-09-14 DIAGNOSIS — M542 Cervicalgia: Secondary | ICD-10-CM | POA: Diagnosis not present

## 2018-09-14 DIAGNOSIS — M9901 Segmental and somatic dysfunction of cervical region: Secondary | ICD-10-CM | POA: Diagnosis not present

## 2018-09-27 DIAGNOSIS — M542 Cervicalgia: Secondary | ICD-10-CM | POA: Diagnosis not present

## 2018-09-27 DIAGNOSIS — M546 Pain in thoracic spine: Secondary | ICD-10-CM | POA: Diagnosis not present

## 2018-09-27 DIAGNOSIS — M9901 Segmental and somatic dysfunction of cervical region: Secondary | ICD-10-CM | POA: Diagnosis not present

## 2018-09-27 DIAGNOSIS — M9902 Segmental and somatic dysfunction of thoracic region: Secondary | ICD-10-CM | POA: Diagnosis not present

## 2018-10-21 ENCOUNTER — Telehealth: Payer: Self-pay | Admitting: Emergency Medicine

## 2018-10-21 NOTE — Telephone Encounter (Signed)
error 

## 2018-10-24 ENCOUNTER — Encounter: Payer: Self-pay | Admitting: Physician Assistant

## 2018-10-24 ENCOUNTER — Ambulatory Visit (INDEPENDENT_AMBULATORY_CARE_PROVIDER_SITE_OTHER): Payer: Medicare Other | Admitting: Physician Assistant

## 2018-10-24 ENCOUNTER — Other Ambulatory Visit: Payer: Self-pay

## 2018-10-24 VITALS — BP 138/67 | HR 93 | Temp 95.9°F

## 2018-10-24 DIAGNOSIS — M9901 Segmental and somatic dysfunction of cervical region: Secondary | ICD-10-CM | POA: Diagnosis not present

## 2018-10-24 DIAGNOSIS — E1169 Type 2 diabetes mellitus with other specified complication: Secondary | ICD-10-CM | POA: Diagnosis not present

## 2018-10-24 DIAGNOSIS — M546 Pain in thoracic spine: Secondary | ICD-10-CM | POA: Diagnosis not present

## 2018-10-24 DIAGNOSIS — M542 Cervicalgia: Secondary | ICD-10-CM | POA: Diagnosis not present

## 2018-10-24 DIAGNOSIS — E119 Type 2 diabetes mellitus without complications: Secondary | ICD-10-CM

## 2018-10-24 DIAGNOSIS — E785 Hyperlipidemia, unspecified: Secondary | ICD-10-CM | POA: Diagnosis not present

## 2018-10-24 DIAGNOSIS — M9902 Segmental and somatic dysfunction of thoracic region: Secondary | ICD-10-CM | POA: Diagnosis not present

## 2018-10-24 MED ORDER — LIRAGLUTIDE 18 MG/3ML ~~LOC~~ SOPN: PEN_INJECTOR | SUBCUTANEOUS | 2 refills | Status: DC

## 2018-10-24 NOTE — Progress Notes (Signed)
I have discussed the procedure for the virtual visit with the patient who has given consent to proceed with assessment and treatment.   Tonya Tapia, CMA     

## 2018-10-24 NOTE — Progress Notes (Signed)
Virtual Visit via Video   I connected with patient on 10/24/18 at  9:30 AM EDT by a video enabled telemedicine application and verified that I am speaking with the correct person using two identifiers.  Location patient: Home Location provider: [facility], Office Persons participating in the virtual visit: Patient, Provider, [place] (Patina Moore)  I discussed the limitations of evaluation and management by telemedicine and the availability of in person appointments. The patient expressed understanding and agreed to proceed.  Subjective:   HPI:   Patient presents today via [facility] for Diabetes Follow-up. Patient with DM II, controlled without complication. At last follow-up A1C had increased from 6.6 to 7.8. Patient was advised by covering provider at that time to work harder on diet and exercise and to continue the current medication regimen, following up in 3 months. Patient was due for this in January but has not been seen. Patient is currently on a regimen Metformin XR 2000 mg daily and Victoza 1.2 mg daily. Endorses fasting sugars averaging now 200-250 usually but has been up to 280. Has cut out sweets but is eating mainly fast food and processed foods currently during this Pandemic to avoid going to the store often. Is not exercising currently. Denies vision changes (up-to-date on eye examination). Notes rare tingling of feet bilaterally but very minimal. Keeps close eye on her feet.  Immunizations are up-to-date. She endorses continuing her ACEI as directed.  ROS:   See pertinent positives and negatives per HPI.  Patient Active Problem List   Diagnosis Date Noted  . Hyperlipidemia associated with type 2 diabetes mellitus ([place]) 10/11/2017  . Hypomagnesemia   . S/P cholecystectomy   . Choledocholithiasis with chronic cholecystitis   . Abnormal LFTs 04/02/2017  . Osteopenia 07/16/2016  . Vitamin D deficiency 07/16/2016  . Postmenopausal estrogen deficiency 06/15/2016  .  Type 2 diabetes mellitus without complication, without long-term current use of insulin ([place]) 01/29/2015  . Anxiety and depression 01/29/2015  . Breast cancer screening 01/29/2015  . RESTLESS LEG SYNDROME 07/04/2007  . MIGRAINE, MENSTRUAL 07/04/2007  . PSORIASIS 07/04/2007  . History of partial thyroidectomy 07/04/2007    Social History   Tobacco Use  . Smoking status: Never Smoker  . Smokeless tobacco: Never Used  Substance Use Topics  . Alcohol use: Yes    Alcohol/week: 0.0 standard drinks    Comment: occas.    Current Outpatient Medications:  .  ALPRAZolam (XANAX) 0.25 MG tablet, TAKE 1 TABLET BY MOUTH EVERY 12 HOURS AS NEEDED FOR ANXIETY, Disp: 30 tablet, Rfl: 0 .  atorvastatin (LIPITOR) 20 MG tablet, Take 1 tablet (20 mg total) by mouth at bedtime., Disp: 90 tablet, Rfl: 2 .  Blood Glucose Monitoring Suppl ([place]) KIT, 1 kit by Does not apply route daily. Dx: E11.9, Disp: 1 each, Rfl: 0 .  Calcium Carb-Cholecalciferol (CALCIUM-VITAMIN D) 600-400 MG-UNIT TABS, Take 2 each by mouth daily., Disp: , Rfl:  .  Insulin Pen Needle (BD PEN NEEDLE NANO U/F) 32G X 4 MM MISC, USE DAILY AS DIRECTED, Disp: 100 each, Rfl: 3 .  liraglutide (VICTOZA) 18 MG/3ML SOPN, ADMINISTER 1.2 MG UNDER THE SKIN DAILY, Disp: 6 mL, Rfl: 2 .  metFORMIN (GLUCOPHAGE-XR) 500 MG 24 hr tablet, TAKE 2 TABLETS(1000 MG) BY MOUTH TWICE DAILY WITH A MEAL, Disp: 360 tablet, Rfl: 1 .  OVER THE COUNTER MEDICATION, Multiple Vitamins-Minerals-Take 1 tablet by mouth daily., Disp: , Rfl:  .  ramipril (ALTACE) 2.5 MG capsule, TAKE 1 CAPSULE(2.5 MG)  BY MOUTH DAILY, Disp: 90 capsule, Rfl: 1 .  sertraline (ZOLOFT) 100 MG tablet, TAKE 1 TABLET(100 MG) BY MOUTH DAILY, Disp: 90 tablet, Rfl: 1 .  TRUE METRIX BLOOD GLUCOSE TEST test strip, CHECK BLOOD SUGAR ONCE DAILY, Disp: 100 each, Rfl: 11  No Known Allergies  Objective:   BP 138/67   Pulse 93   Temp (!) 95.9 F (35.5 C) (Oral)   Patient is well-developed,  well-nourished in no acute distress.  Resting comfortably in bed at home.  Head is normocephalic, atraumatic.  No labored breathing.  Speech is clear and coherent with logical contest.  Patient is alert and oriented at baseline.   Assessment and Plan:    1. Hyperlipidemia associated with type 2 diabetes mellitus ([place]) Taking medications as directed. Dietary and exercise recommendations reviewed with patient. Handout sent to MyChart.   2. Type 2 diabetes mellitus without complication, without long-term current use of insulin ([place]) Will repeat CMP, Lipids and A1C. Giving last A1C and current glucose readings will have her continue on current dose of Metformin but increase Victoza to 1.8 mg daily. She has got to change diet and get more active. Discussed dietary and exercise recommendations with patient. Will call and discuss follow-up/next steps with her once lab results are in.Leeanne Rio, PA-C 10/24/2018

## 2018-10-24 NOTE — Patient Instructions (Signed)
Instructions sent to MyChart.   Please continue the Metformin as directed. The Victoza we are increasing to 1.8 mg daily (I have sent in a new prescription to reflect this change). Continue other medications as directed.  Work on a goal of getting in 30 minutes of aerobic activity per day. We really need to work on diet. Please see handout below. You need to cut back on the carbs you are taking in. Substitute whole grain breads, wheat pasta/veggie pasta when you do have these types of things. Increase vegetable and lean protein (chicken, fish, beans).  Avoid late-night eating. Make sure you are getting in plenty of water.  Continue to check fasting blood sugar levels daily and record.  We will see you tomorrow at the lab for blood work. We will alter regimen and determine follow-up when we call you with results.   Diabetes Mellitus and Nutrition, Adult When you have diabetes (diabetes mellitus), it is very important to have healthy eating habits because your blood sugar (glucose) levels are greatly affected by what you eat and drink. Eating healthy foods in the appropriate amounts, at about the same times every day, can help you:  Control your blood glucose.  Lower your risk of heart disease.  Improve your blood pressure.  Reach or maintain a healthy weight. Every person with diabetes is different, and each person has different needs for a meal plan. Your health care provider may recommend that you work with a diet and nutrition specialist (dietitian) to make a meal plan that is best for you. Your meal plan may vary depending on factors such as:  The calories you need.  The medicines you take.  Your weight.  Your blood glucose, blood pressure, and cholesterol levels.  Your activity level.  Other health conditions you have, such as heart or kidney disease. How do carbohydrates affect me? Carbohydrates, also called carbs, affect your blood glucose level more than any other type  of food. Eating carbs naturally raises the amount of glucose in your blood. Carb counting is a method for keeping track of how many carbs you eat. Counting carbs is important to keep your blood glucose at a healthy level, especially if you use insulin or take certain oral diabetes medicines. It is important to know how many carbs you can safely have in each meal. This is different for every person. Your dietitian can help you calculate how many carbs you should have at each meal and for each snack. Foods that contain carbs include:  Bread, cereal, rice, pasta, and crackers.  Potatoes and corn.  Peas, beans, and lentils.  Milk and yogurt.  Fruit and juice.  Desserts, such as cakes, cookies, ice cream, and candy. How does alcohol affect me? Alcohol can cause a sudden decrease in blood glucose (hypoglycemia), especially if you use insulin or take certain oral diabetes medicines. Hypoglycemia can be a life-threatening condition. Symptoms of hypoglycemia (sleepiness, dizziness, and confusion) are similar to symptoms of having too much alcohol. If your health care provider says that alcohol is safe for you, follow these guidelines:  Limit alcohol intake to no more than 1 drink per day for nonpregnant women and 2 drinks per day for men. One drink equals 12 oz of beer, 5 oz of wine, or 1 oz of hard liquor.  Do not drink on an empty stomach.  Keep yourself hydrated with water, diet soda, or unsweetened iced tea.  Keep in mind that regular soda, juice, and other mixers may contain  a lot of sugar and must be counted as carbs. What are tips for following this plan?  Reading food labels  Start by checking the serving size on the "Nutrition Facts" label of packaged foods and drinks. The amount of calories, carbs, fats, and other nutrients listed on the label is based on one serving of the item. Many items contain more than one serving per package.  Check the total grams (g) of carbs in one  serving. You can calculate the number of servings of carbs in one serving by dividing the total carbs by 15. For example, if a food has 30 g of total carbs, it would be equal to 2 servings of carbs.  Check the number of grams (g) of saturated and trans fats in one serving. Choose foods that have low or no amount of these fats.  Check the number of milligrams (mg) of salt (sodium) in one serving. Most people should limit total sodium intake to less than 2,300 mg per day.  Always check the nutrition information of foods labeled as "low-fat" or "nonfat". These foods may be higher in added sugar or refined carbs and should be avoided.  Talk to your dietitian to identify your daily goals for nutrients listed on the label. Shopping  Avoid buying canned, premade, or processed foods. These foods tend to be high in fat, sodium, and added sugar.  Shop around the outside edge of the grocery store. This includes fresh fruits and vegetables, bulk grains, fresh meats, and fresh dairy. Cooking  Use low-heat cooking methods, such as baking, instead of high-heat cooking methods like deep frying.  Cook using healthy oils, such as olive, canola, or sunflower oil.  Avoid cooking with butter, cream, or high-fat meats. Meal planning  Eat meals and snacks regularly, preferably at the same times every day. Avoid going long periods of time without eating.  Eat foods high in fiber, such as fresh fruits, vegetables, beans, and whole grains. Talk to your dietitian about how many servings of carbs you can eat at each meal.  Eat 4-6 ounces (oz) of lean protein each day, such as lean meat, chicken, fish, eggs, or tofu. One oz of lean protein is equal to: ? 1 oz of meat, chicken, or fish. ? 1 egg. ?  cup of tofu.  Eat some foods each day that contain healthy fats, such as avocado, nuts, seeds, and fish. Lifestyle  Check your blood glucose regularly.  Exercise regularly as told by your health care provider.  This may include: ? 150 minutes of moderate-intensity or vigorous-intensity exercise each week. This could be brisk walking, biking, or water aerobics. ? Stretching and doing strength exercises, such as yoga or weightlifting, at least 2 times a week.  Take medicines as told by your health care provider.  Do not use any products that contain nicotine or tobacco, such as cigarettes and e-cigarettes. If you need help quitting, ask your health care provider.  Work with a Social worker or diabetes educator to identify strategies to manage stress and any emotional and social challenges. Questions to ask a health care provider  Do I need to meet with a diabetes educator?  Do I need to meet with a dietitian?  What number can I call if I have questions?  When are the best times to check my blood glucose? Where to find more information:  American Diabetes Association: diabetes.org  Academy of Nutrition and Dietetics: www.eatright.[employer] of Diabetes and Digestive and Kidney Diseases (NIH):  [employer] Summary  A healthy meal plan will help you control your blood glucose and maintain a healthy lifestyle.  Working with a diet and nutrition specialist (dietitian) can help you make a meal plan that is best for you.  Keep in mind that carbohydrates (carbs) and alcohol have immediate effects on your blood glucose levels. It is important to count carbs and to use alcohol carefully. This information is not intended to replace advice given to you by your health care provider. Make sure you discuss any questions you have with your health care provider. Document Released: 03/19/2005 Document Revised: 01/20/2017 Document Reviewed: 07/27/2016 Elsevier Interactive Patient Education  2019 [employer].

## 2018-10-25 ENCOUNTER — Other Ambulatory Visit (INDEPENDENT_AMBULATORY_CARE_PROVIDER_SITE_OTHER): Payer: Medicare Other

## 2018-10-25 DIAGNOSIS — E785 Hyperlipidemia, unspecified: Secondary | ICD-10-CM | POA: Diagnosis not present

## 2018-10-25 DIAGNOSIS — E1169 Type 2 diabetes mellitus with other specified complication: Secondary | ICD-10-CM

## 2018-10-25 DIAGNOSIS — E119 Type 2 diabetes mellitus without complications: Secondary | ICD-10-CM

## 2018-10-25 LAB — COMPREHENSIVE METABOLIC PANEL
ALT: 52 U/L — ABNORMAL HIGH (ref 0–35)
AST: 31 U/L (ref 0–37)
Albumin: 4.2 g/dL (ref 3.5–5.2)
Alkaline Phosphatase: 107 U/L (ref 39–117)
BUN: 11 mg/dL (ref 6–23)
CO2: 24 mEq/L (ref 19–32)
Calcium: 9.6 mg/dL (ref 8.4–10.5)
Chloride: 103 mEq/L (ref 96–112)
Creatinine, Ser: 0.75 mg/dL (ref 0.40–1.20)
GFR: 76.88 mL/min (ref >60.00)
Glucose, Bld: 189 mg/dL — ABNORMAL HIGH (ref 70–99)
Potassium: 4.1 mEq/L (ref 3.5–5.1)
Sodium: 139 mEq/L (ref 135–145)
Total Bilirubin: 0.5 mg/dL (ref 0.2–1.2)
Total Protein: 6.5 g/dL (ref 6.0–8.3)

## 2018-10-25 LAB — HEMOGLOBIN A1C: Hgb A1c MFr Bld: 9.7 % — ABNORMAL HIGH (ref 4.6–6.5)

## 2018-10-25 LAB — LIPID PANEL
Cholesterol: 143 mg/dL (ref 0–200)
HDL: 54.4 mg/dL (ref >39.00)
LDL Cholesterol: 68 mg/dL (ref 0–99)
NonHDL: 88.66
Total CHOL/HDL Ratio: 3
Triglycerides: 105 mg/dL (ref 0.0–149.0)
VLDL: 21 mg/dL (ref 0.0–40.0)

## 2018-10-31 DIAGNOSIS — M542 Cervicalgia: Secondary | ICD-10-CM | POA: Diagnosis not present

## 2018-10-31 DIAGNOSIS — M9902 Segmental and somatic dysfunction of thoracic region: Secondary | ICD-10-CM | POA: Diagnosis not present

## 2018-10-31 DIAGNOSIS — M546 Pain in thoracic spine: Secondary | ICD-10-CM | POA: Diagnosis not present

## 2018-10-31 DIAGNOSIS — M9901 Segmental and somatic dysfunction of cervical region: Secondary | ICD-10-CM | POA: Diagnosis not present

## 2018-11-14 ENCOUNTER — Other Ambulatory Visit: Payer: Self-pay | Admitting: Physician Assistant

## 2018-11-15 DIAGNOSIS — M9902 Segmental and somatic dysfunction of thoracic region: Secondary | ICD-10-CM | POA: Diagnosis not present

## 2018-11-15 DIAGNOSIS — M542 Cervicalgia: Secondary | ICD-10-CM | POA: Diagnosis not present

## 2018-11-15 DIAGNOSIS — M546 Pain in thoracic spine: Secondary | ICD-10-CM | POA: Diagnosis not present

## 2018-11-15 DIAGNOSIS — M9901 Segmental and somatic dysfunction of cervical region: Secondary | ICD-10-CM | POA: Diagnosis not present

## 2018-11-25 ENCOUNTER — Encounter: Payer: Self-pay | Admitting: Physician Assistant

## 2018-12-03 ENCOUNTER — Encounter: Payer: Self-pay | Admitting: Physician Assistant

## 2018-12-04 ENCOUNTER — Encounter: Payer: Self-pay | Admitting: Physician Assistant

## 2018-12-05 ENCOUNTER — Encounter: Payer: Self-pay | Admitting: Physician Assistant

## 2018-12-06 DIAGNOSIS — M542 Cervicalgia: Secondary | ICD-10-CM | POA: Diagnosis not present

## 2018-12-06 DIAGNOSIS — M9902 Segmental and somatic dysfunction of thoracic region: Secondary | ICD-10-CM | POA: Diagnosis not present

## 2018-12-06 DIAGNOSIS — M9901 Segmental and somatic dysfunction of cervical region: Secondary | ICD-10-CM | POA: Diagnosis not present

## 2018-12-06 DIAGNOSIS — M546 Pain in thoracic spine: Secondary | ICD-10-CM | POA: Diagnosis not present

## 2018-12-14 ENCOUNTER — Other Ambulatory Visit: Payer: Self-pay | Admitting: Emergency Medicine

## 2018-12-14 DIAGNOSIS — E119 Type 2 diabetes mellitus without complications: Secondary | ICD-10-CM

## 2018-12-14 MED ORDER — GLUCOSE BLOOD VI STRP: ORAL_STRIP | 3 refills | Status: AC

## 2018-12-29 ENCOUNTER — Encounter: Payer: Self-pay | Admitting: Internal Medicine

## 2019-01-04 ENCOUNTER — Encounter: Payer: Self-pay | Admitting: Physician Assistant

## 2019-01-04 DIAGNOSIS — M546 Pain in thoracic spine: Secondary | ICD-10-CM | POA: Diagnosis not present

## 2019-01-04 DIAGNOSIS — M9901 Segmental and somatic dysfunction of cervical region: Secondary | ICD-10-CM | POA: Diagnosis not present

## 2019-01-04 DIAGNOSIS — M9902 Segmental and somatic dysfunction of thoracic region: Secondary | ICD-10-CM | POA: Diagnosis not present

## 2019-01-04 DIAGNOSIS — M542 Cervicalgia: Secondary | ICD-10-CM | POA: Diagnosis not present

## 2019-01-17 ENCOUNTER — Encounter: Payer: Self-pay | Admitting: Physician Assistant

## 2019-01-24 ENCOUNTER — Other Ambulatory Visit: Payer: Self-pay | Admitting: Physician Assistant

## 2019-01-24 DIAGNOSIS — F419 Anxiety disorder, unspecified: Secondary | ICD-10-CM

## 2019-01-24 DIAGNOSIS — F329 Major depressive disorder, single episode, unspecified: Secondary | ICD-10-CM

## 2019-01-26 ENCOUNTER — Other Ambulatory Visit: Payer: Self-pay

## 2019-01-26 ENCOUNTER — Ambulatory Visit (INDEPENDENT_AMBULATORY_CARE_PROVIDER_SITE_OTHER): Payer: Medicare Other | Admitting: Physician Assistant

## 2019-01-26 ENCOUNTER — Encounter: Payer: Self-pay | Admitting: Physician Assistant

## 2019-01-26 DIAGNOSIS — F419 Anxiety disorder, unspecified: Secondary | ICD-10-CM

## 2019-01-26 DIAGNOSIS — F329 Major depressive disorder, single episode, unspecified: Secondary | ICD-10-CM

## 2019-01-26 DIAGNOSIS — E119 Type 2 diabetes mellitus without complications: Secondary | ICD-10-CM

## 2019-01-26 MED ORDER — SERTRALINE HCL 100 MG PO TABS: 150.0000 mg | ORAL_TABLET | Freq: Every day | ORAL | 1 refills | Status: DC

## 2019-01-26 NOTE — Progress Notes (Signed)
 Virtual Visit via Video   I connected with patient on 01/26/19 at  9:00 AM EDT by a video enabled telemedicine application and verified that I am speaking with the correct person using two identifiers.  Location patient: Home Location provider: [place], Office Persons participating in the virtual visit: Patient, Provider, CMA (Patina Moore)  I discussed the limitations of evaluation and management by telemedicine and the availability of in person appointments. The patient expressed understanding and agreed to proceed.  Subjective:   HPI:   Patient presents via [employer] today for follow-up of DM and to discuss mood.   Diabetes Mellitus --patient is currently on metformin XR 2000 mg once daily and Victoza 1.8 mg daily.  This was increased from 1.2 mg daily at last visit due to hyperglycemia.  Patient with history of mild neuropathy, denying any worsening of symptoms since last assessment.  Patient without history of nephropathy or retinopathy from diabetes.  Does have hyperlipidemia associated with diabetes.  Patient endorses diet is well-balanced overall but she still has an issue with her sweets.  Is staying hydrated.  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  Is afraid to leave him alone due to risk of falls.  Patient denies vision changes since last visit.  Denies change in urination.  Is due for foot exam.   Patient also with history of anxiety and depression, currently on sertraline 100 mg daily.  Has prescription for Xanax which she seldom uses.  Over the past few months has noted a quite significant increase in her stress levels.  Notes that some days she just feels defeated as doctors cannot seem to figure out what is going on with her husband as he continues to decline.  This is causing quite increase in depressed mood.  Notes that she stays positive overall and can get herself out of a bad mood.  States her faith keeps  her going.  Denies suicidal thought or ideation.  Does note some increase in anxiety recently related to her husband's illness which is causing an increased use of Xanax.  Still only taken maybe once per week as she tries to avoid using this medicine at all costs.   ROS:   See pertinent positives and negatives per HPI.  Patient Active Problem List   Diagnosis Date Noted  . Hyperlipidemia associated with type 2 diabetes mellitus (HCC) 10/11/2017  . Hypomagnesemia   . S/P cholecystectomy   . Choledocholithiasis with chronic cholecystitis   . Abnormal LFTs 04/02/2017  . Osteopenia 07/16/2016  . Vitamin D deficiency 07/16/2016  . Postmenopausal estrogen deficiency 06/15/2016  . Type 2 diabetes mellitus without complication, without long-term current use of insulin (HCC) 01/29/2015  . Anxiety and depression 01/29/2015  . Breast cancer screening 01/29/2015  . RESTLESS LEG SYNDROME 07/04/2007  . MIGRAINE, MENSTRUAL 07/04/2007  . PSORIASIS 07/04/2007  . History of partial thyroidectomy 07/04/2007    Social History   Tobacco Use  . Smoking status: Never Smoker  . Smokeless tobacco: Never Used  Substance Use Topics  . Alcohol use: Yes    Alcohol/week: 0.0 standard drinks    Comment: occas.    Current Outpatient Medications:  .  ALPRAZolam (XANAX) 0.25 MG tablet, TAKE 1 TABLET BY MOUTH EVERY 12 HOURS AS NEEDED FOR ANXIETY, Disp: 30 tablet, Rfl: 0 .  atorvastatin (LIPITOR) 20 MG tablet, Take 1 tablet (20 mg total) by mouth at bedtime., Disp: 90 tablet, Rfl: 2 .    Blood Glucose Monitoring Suppl (TRUETRACK SMART SYSTEM) KIT, 1 kit by Does not apply route daily. Dx: E11.9, Disp: 1 each, Rfl: 0 .  Calcium Carb-Cholecalciferol (CALCIUM-VITAMIN D) 600-400 MG-UNIT TABS, Take 2 each by mouth daily., Disp: , Rfl:  .  glucose blood (TRUE METRIX BLOOD GLUCOSE TEST) test strip, Check blood sugars daily. Dx: E11.9, Disp: 300 each, Rfl: 3 .  Insulin Pen Needle (BD PEN NEEDLE NANO U/F) 32G X 4 MM MISC,  USE DAILY AS DIRECTED, Disp: 100 each, Rfl: 3 .  liraglutide (VICTOZA) 18 MG/3ML SOPN, ADMINISTER 1.8 MG UNDER THE SKIN DAILY, Disp: 9 mL, Rfl: 1 .  metFORMIN (GLUCOPHAGE-XR) 500 MG 24 hr tablet, TAKE 2 TABLETS(1000 MG) BY MOUTH TWICE DAILY WITH A MEAL, Disp: 360 tablet, Rfl: 0 .  OVER THE COUNTER MEDICATION, Multiple Vitamins-Minerals-Take 1 tablet by mouth daily., Disp: , Rfl:  .  ramipril (ALTACE) 2.5 MG capsule, TAKE 1 CAPSULE(2.5 MG) BY MOUTH DAILY, Disp: 90 capsule, Rfl: 1 .  sertraline (ZOLOFT) 100 MG tablet, TAKE 1 TABLET(100 MG) BY MOUTH DAILY, Disp: 90 tablet, Rfl: 0  No Known Allergies  Objective:   There were no vitals taken for this visit.  Patient is well-developed, well-nourished in no acute distress.  Resting comfortably at home.  Head is normocephalic, atraumatic.  No labored breathing.  Speech is clear and coherent with logical contest.  Patient is alert and oriented at baseline.     Assessment and Plan:   1. Anxiety and depression We will plan to increase sertraline to 150 mg daily.  New Rx sent in.  Patient to take Xanax as directed if needed for more acute anxiety.  Declines counseling at this time.  Close follow-up scheduled. - sertraline (ZOLOFT) 100 MG tablet; Take 1.5 tablets (150 mg total) by mouth daily.  Dispense: 135 tablet; Refill: 1  2. Type 2 diabetes mellitus without complication, without long-term current use of insulin (HCC) Remains uncontrolled.  Her fasting glucoses averaging anywhere from 1 60-2 50 on a regular basis.  Again discussed dietary and exercise recommendations.  Do understand there are some limitations here.  Glucose level also being affected by chronic stress levels which are no doubt increasing her cortisol production/release.  We are working on stress and anxiety.  Plan to continue metformin at current dose.  Will discontinue Victoza and switch to insulin.  Do need to check with her insurance to see what is clinically most cost  effective at present.  Will hold off on labs at this point until we get her started on insulin.  We will then bring her into office for repeat assessment, foot exam and date of A1c and renal function.    William Cody Martin, PA-C 01/26/2019    

## 2019-01-26 NOTE — Progress Notes (Signed)
I have discussed the procedure for the virtual visit with the patient who has given consent to proceed with assessment and treatment.   Tonya Tapia, CMA     

## 2019-01-26 NOTE — Patient Instructions (Signed)
Instructions sent to my chart.  I have increased your sertraline to 150 mg, or 1.5 tablets, per day. A prescription has been sent to your [facility] pharmacy. Please take the medication as directed. Take the Xanax as directed if needed for more acute anxiety/panic attack. Follow-up with me in 1 month via video visit for reassessment of mood. Return to clinic sooner if needed.  I am checking with your Medicare formulary to see what insulin is going to be the preferred prescription. As soon as we get this determined we will contact you to discuss further treatment.  Hang in there!

## 2019-02-08 ENCOUNTER — Encounter: Payer: Self-pay | Admitting: Physician Assistant

## 2019-02-08 DIAGNOSIS — M9902 Segmental and somatic dysfunction of thoracic region: Secondary | ICD-10-CM | POA: Diagnosis not present

## 2019-02-08 DIAGNOSIS — M542 Cervicalgia: Secondary | ICD-10-CM | POA: Diagnosis not present

## 2019-02-08 DIAGNOSIS — M546 Pain in thoracic spine: Secondary | ICD-10-CM | POA: Diagnosis not present

## 2019-02-08 DIAGNOSIS — M9901 Segmental and somatic dysfunction of cervical region: Secondary | ICD-10-CM | POA: Diagnosis not present

## 2019-02-08 MED ORDER — NOVOLIN 70/30 FLEXPEN (70-30) 100 UNIT/ML ~~LOC~~ SUPN: PEN_INJECTOR | SUBCUTANEOUS | 1 refills | Status: DC

## 2019-02-09 ENCOUNTER — Other Ambulatory Visit: Payer: Self-pay | Admitting: Physician Assistant

## 2019-02-17 ENCOUNTER — Encounter: Payer: Self-pay | Admitting: Physician Assistant

## 2019-02-20 ENCOUNTER — Encounter: Payer: Self-pay | Admitting: Physician Assistant

## 2019-02-23 ENCOUNTER — Encounter: Payer: Self-pay | Admitting: Physician Assistant

## 2019-02-28 ENCOUNTER — Other Ambulatory Visit: Payer: Self-pay

## 2019-02-28 ENCOUNTER — Encounter: Payer: Self-pay | Admitting: Physician Assistant

## 2019-02-28 ENCOUNTER — Ambulatory Visit (INDEPENDENT_AMBULATORY_CARE_PROVIDER_SITE_OTHER): Payer: Medicare Other | Admitting: Physician Assistant

## 2019-02-28 DIAGNOSIS — Z794 Long term (current) use of insulin: Secondary | ICD-10-CM | POA: Diagnosis not present

## 2019-02-28 DIAGNOSIS — E119 Type 2 diabetes mellitus without complications: Secondary | ICD-10-CM | POA: Diagnosis not present

## 2019-02-28 NOTE — Patient Instructions (Signed)
Instructions sent to MyChart.  I want you to work very hard on being consistent with timing and consistency with meals.  Please try to limit carbohydrates in meals, and making sure you eat a protein anytime you are having a carb.   For insulin, we will leave your morning dose the same at 12 units.  For the evening dose I want you to increase again by 2 units (16 units). Check glucose as directed. If AM glucose is still above goal of 100-130 consistently, would have you increase again by another 2 units. If you note glucose getting below goal, decrease by 3 units and call or message me.   Please look at your schedule so you can pick a day/time to come in so we can update labs.  I have placed orders for you.   Diabetes Mellitus and Nutrition, Adult When you have diabetes (diabetes mellitus), it is very important to have healthy eating habits because your blood sugar (glucose) levels are greatly affected by what you eat and drink. Eating healthy foods in the appropriate amounts, at about the same times every day, can help you:  Control your blood glucose.  Lower your risk of heart disease.  Improve your blood pressure.  Reach or maintain a healthy weight. Every person with diabetes is different, and each person has different needs for a meal plan. Your health care provider may recommend that you work with a diet and nutrition specialist (dietitian) to make a meal plan that is best for you. Your meal plan may vary depending on factors such as:  The calories you need.  The medicines you take.  Your weight.  Your blood glucose, blood pressure, and cholesterol levels.  Your activity level.  Other health conditions you have, such as heart or kidney disease. How do carbohydrates affect me? Carbohydrates, also called carbs, affect your blood glucose level more than any other type of food. Eating carbs naturally raises the amount of glucose in your blood. Carb counting is a method for  keeping track of how many carbs you eat. Counting carbs is important to keep your blood glucose at a healthy level, especially if you use insulin or take certain oral diabetes medicines. It is important to know how many carbs you can safely have in each meal. This is different for every person. Your dietitian can help you calculate how many carbs you should have at each meal and for each snack. Foods that contain carbs include:  Bread, cereal, rice, pasta, and crackers.  Potatoes and corn.  Peas, beans, and lentils.  Milk and yogurt.  Fruit and juice.  Desserts, such as cakes, cookies, ice cream, and candy. How does alcohol affect me? Alcohol can cause a sudden decrease in blood glucose (hypoglycemia), especially if you use insulin or take certain oral diabetes medicines. Hypoglycemia can be a life-threatening condition. Symptoms of hypoglycemia (sleepiness, dizziness, and confusion) are similar to symptoms of having too much alcohol. If your health care provider says that alcohol is safe for you, follow these guidelines:  Limit alcohol intake to no more than 1 drink per day for nonpregnant women and 2 drinks per day for men. One drink equals 12 oz of beer, 5 oz of wine, or 1 oz of hard liquor.  Do not drink on an empty stomach.  Keep yourself hydrated with water, diet soda, or unsweetened iced tea.  Keep in mind that regular soda, juice, and other mixers may contain a lot of sugar and must be  counted as carbs. What are tips for following this plan?  Reading food labels  Start by checking the serving size on the "Nutrition Facts" label of packaged foods and drinks. The amount of calories, carbs, fats, and other nutrients listed on the label is based on one serving of the item. Many items contain more than one serving per package.  Check the total grams (g) of carbs in one serving. You can calculate the number of servings of carbs in one serving by dividing the total carbs by 15. For  example, if a food has 30 g of total carbs, it would be equal to 2 servings of carbs.  Check the number of grams (g) of saturated and trans fats in one serving. Choose foods that have low or no amount of these fats.  Check the number of milligrams (mg) of salt (sodium) in one serving. Most people should limit total sodium intake to less than 2,300 mg per day.  Always check the nutrition information of foods labeled as "low-fat" or "nonfat". These foods may be higher in added sugar or refined carbs and should be avoided.  Talk to your dietitian to identify your daily goals for nutrients listed on the label. Shopping  Avoid buying canned, premade, or processed foods. These foods tend to be high in fat, sodium, and added sugar.  Shop around the outside edge of the grocery store. This includes fresh fruits and vegetables, bulk grains, fresh meats, and fresh dairy. Cooking  Use low-heat cooking methods, such as baking, instead of high-heat cooking methods like deep frying.  Cook using healthy oils, such as olive, canola, or sunflower oil.  Avoid cooking with butter, cream, or high-fat meats. Meal planning  Eat meals and snacks regularly, preferably at the same times every day. Avoid going long periods of time without eating.  Eat foods high in fiber, such as fresh fruits, vegetables, beans, and whole grains. Talk to your dietitian about how many servings of carbs you can eat at each meal.  Eat 4-6 ounces (oz) of lean protein each day, such as lean meat, chicken, fish, eggs, or tofu. One oz of lean protein is equal to: ? 1 oz of meat, chicken, or fish. ? 1 egg. ?  cup of tofu.  Eat some foods each day that contain healthy fats, such as avocado, nuts, seeds, and fish. Lifestyle  Check your blood glucose regularly.  Exercise regularly as told by your health care provider. This may include: ? 150 minutes of moderate-intensity or vigorous-intensity exercise each week. This could be brisk  walking, biking, or water aerobics. ? Stretching and doing strength exercises, such as yoga or weightlifting, at least 2 times a week.  Take medicines as told by your health care provider.  Do not use any products that contain nicotine or tobacco, such as cigarettes and e-cigarettes. If you need help quitting, ask your health care provider.  Work with a Social worker or diabetes educator to identify strategies to manage stress and any emotional and social challenges. Questions to ask a health care provider  Do I need to meet with a diabetes educator?  Do I need to meet with a dietitian?  What number can I call if I have questions?  When are the best times to check my blood glucose? Where to find more information:  American Diabetes Association: diabetes.org  Academy of Nutrition and Dietetics: www.eatright.[employer] of Diabetes and Digestive and Kidney Diseases (NIH): [employer] Summary  A healthy meal plan  will help you control your blood glucose and maintain a healthy lifestyle.  Working with a diet and nutrition specialist (dietitian) can help you make a meal plan that is best for you.  Keep in mind that carbohydrates (carbs) and alcohol have immediate effects on your blood glucose levels. It is important to count carbs and to use alcohol carefully. This information is not intended to replace advice given to you by your health care provider. Make sure you discuss any questions you have with your health care provider. Document Released: 03/19/2005 Document Revised: 06/04/2017 Document Reviewed: 07/27/2016 Elsevier Patient Education  2020 [employer].

## 2019-02-28 NOTE — Progress Notes (Signed)
Virtual Visit via Video   I connected with patient on 02/28/19 at  9:00 AM EDT by a video enabled telemedicine application and verified that I am speaking with the correct person using two identifiers.  Location patient: Home Location provider: [facility], Office Persons participating in the virtual visit: Patient, Provider, [place] (Tonya Tapia)  I discussed the limitations of evaluation and management by telemedicine and the availability of in person appointments. The patient expressed understanding and agreed to proceed.  Subjective:   HPI:   Patient presents via Doxy.Me for follow-up of glucose readings after titration of her Novolin 70/30. Patient is currently taking 12 units QAM and 14 units QPM. Is being very consistent with timing of meals but still eating mainly take out as she and her husband do not cook. Notes AM fasting glucose still averaging between 130-170. PM Glucose levels typically in 150s-160s. Is not exercising as recommended. Is continuing her Victoza for now until Rx runs out (stopping due to significant cost increase with her plan).   Lab Results  Component Value Date   HGBA1C 9.7 (H) 10/25/2018     ROS:   See pertinent positives and negatives per HPI.  Patient Active Problem List   Diagnosis Date Noted  . Hyperlipidemia associated with type 2 diabetes mellitus ([place]) 10/11/2017  . Hypomagnesemia   . S/P cholecystectomy   . Choledocholithiasis with chronic cholecystitis   . Abnormal LFTs 04/02/2017  . Osteopenia 07/16/2016  . Vitamin D deficiency 07/16/2016  . Postmenopausal estrogen deficiency 06/15/2016  . Type 2 diabetes mellitus without complication, without long-term current use of insulin ([place]) 01/29/2015  . Anxiety and depression 01/29/2015  . Breast cancer screening 01/29/2015  . RESTLESS LEG SYNDROME 07/04/2007  . MIGRAINE, MENSTRUAL 07/04/2007  . PSORIASIS 07/04/2007  . History of partial thyroidectomy 07/04/2007    Social History    Tobacco Use  . Smoking status: Never Smoker  . Smokeless tobacco: Never Used  Substance Use Topics  . Alcohol use: Yes    Alcohol/week: 0.0 standard drinks    Comment: occas.    Current Outpatient Medications:  .  ALPRAZolam (XANAX) 0.25 MG tablet, TAKE 1 TABLET BY MOUTH EVERY 12 HOURS AS NEEDED FOR ANXIETY, Disp: 30 tablet, Rfl: 0 .  atorvastatin (LIPITOR) 20 MG tablet, Take 1 tablet (20 mg total) by mouth at bedtime., Disp: 90 tablet, Rfl: 2 .  Blood Glucose Monitoring Suppl ([place]) KIT, 1 kit by Does not apply route daily. Dx: E11.9, Disp: 1 each, Rfl: 0 .  Calcium Carb-Cholecalciferol (CALCIUM-VITAMIN D) 600-400 MG-UNIT TABS, Take 2 each by mouth daily., Disp: , Rfl:  .  glucose blood (TRUE METRIX BLOOD GLUCOSE TEST) test strip, Check blood sugars daily. Dx: E11.9, Disp: 300 each, Rfl: 3 .  Insulin Isophane & Regular Human (NOVOLIN 70/30 FLEXPEN) (70-30) 100 UNIT/ML PEN, Inject 10 units twice daily (QAM and evening). (Patient taking differently: Inject 12 units in AM, 14 units in evening), Disp: 3 pen, Rfl: 1 .  Insulin Pen Needle (BD PEN NEEDLE NANO U/F) 32G X 4 MM MISC, USE DAILY AS DIRECTED, Disp: 100 each, Rfl: 3 .  liraglutide (VICTOZA) 18 MG/3ML SOPN, Inject 1.8 mg into the skin daily., Disp: , Rfl:  .  metFORMIN (GLUCOPHAGE-XR) 500 MG 24 hr tablet, TAKE 2 TABLETS(1000 MG) BY MOUTH TWICE DAILY WITH A MEAL, Disp: 360 tablet, Rfl: 0 .  OVER THE COUNTER MEDICATION, Multiple Vitamins-Minerals-Take 1 tablet by mouth daily., Disp: , Rfl:  .  ramipril (  ALTACE) 2.5 MG capsule, TAKE 1 CAPSULE(2.5 MG) BY MOUTH DAILY, Disp: 90 capsule, Rfl: 0 .  sertraline (ZOLOFT) 100 MG tablet, Take 1.5 tablets (150 mg total) by mouth daily., Disp: 135 tablet, Rfl: 1  No Known Allergies  Objective:   There were no vitals taken for this visit.  Patient is well-developed, well-nourished in no acute distress.  Resting comfortably at home.  Head is normocephalic, atraumatic.  No  labored breathing.  Speech is clear and coherent with logical content.  Patient is alert and oriented at baseline.   Assessment and Plan:   1. Type 2 diabetes mellitus without complication, with long-term current use of insulin (HCC) Still not making changes to diet or activity level despite multiple discussions on the need for this. Will give her another handout on dietary changes to review. Has refused diabetic nutritionist. At this point, will focus on insulin titration to keep glucose at better levels and help reduce risk of complications. Future orders placed. She will call back to schedule a lab appt. Will update foot exam the next time she is at in-office appt (declines currently due to COVID and husband's health). Eye exam UTD. Will keep AM insulin at 12 units. Increase PM iunsulin by 2 units. Titration schedule given . She will follow-up next week via MyChart with readings so further changes can be made.     Tonya Rio, PA-C 02/28/2019

## 2019-02-28 NOTE — Progress Notes (Signed)
I have discussed the procedure for the virtual visit with the patient who has given consent to proceed with assessment and treatment.   Tonya Tapia S Tonya Tapia, CMA     

## 2019-03-07 ENCOUNTER — Other Ambulatory Visit: Payer: Self-pay | Admitting: Physician Assistant

## 2019-03-07 ENCOUNTER — Encounter: Payer: Self-pay | Admitting: Physician Assistant

## 2019-03-07 MED ORDER — ALPRAZOLAM 0.25 MG PO TABS: 0.2500 mg | ORAL_TABLET | Freq: Two times a day (BID) | ORAL | 0 refills | Status: DC | PRN

## 2019-03-08 DIAGNOSIS — M9902 Segmental and somatic dysfunction of thoracic region: Secondary | ICD-10-CM | POA: Diagnosis not present

## 2019-03-08 DIAGNOSIS — M546 Pain in thoracic spine: Secondary | ICD-10-CM | POA: Diagnosis not present

## 2019-03-08 DIAGNOSIS — M9901 Segmental and somatic dysfunction of cervical region: Secondary | ICD-10-CM | POA: Diagnosis not present

## 2019-03-08 DIAGNOSIS — M542 Cervicalgia: Secondary | ICD-10-CM | POA: Diagnosis not present

## 2019-03-23 ENCOUNTER — Ambulatory Visit (INDEPENDENT_AMBULATORY_CARE_PROVIDER_SITE_OTHER): Payer: Medicare Other

## 2019-03-23 ENCOUNTER — Other Ambulatory Visit: Payer: Self-pay

## 2019-03-23 ENCOUNTER — Encounter: Payer: Self-pay | Admitting: Physician Assistant

## 2019-03-23 DIAGNOSIS — Z794 Long term (current) use of insulin: Secondary | ICD-10-CM

## 2019-03-23 DIAGNOSIS — Z23 Encounter for immunization: Secondary | ICD-10-CM

## 2019-03-23 DIAGNOSIS — E119 Type 2 diabetes mellitus without complications: Secondary | ICD-10-CM | POA: Diagnosis not present

## 2019-03-23 LAB — BASIC METABOLIC PANEL
BUN: 12 mg/dL (ref 6–23)
CO2: 27 mEq/L (ref 19–32)
Calcium: 9.9 mg/dL (ref 8.4–10.5)
Chloride: 105 mEq/L (ref 96–112)
Creatinine, Ser: 0.78 mg/dL (ref 0.40–1.20)
GFR: 73.39 mL/min (ref >60.00)
Glucose, Bld: 108 mg/dL — ABNORMAL HIGH (ref 70–99)
Potassium: 4.3 mEq/L (ref 3.5–5.1)
Sodium: 142 mEq/L (ref 135–145)

## 2019-03-23 LAB — HEMOGLOBIN A1C: Hgb A1c MFr Bld: 8.1 % — ABNORMAL HIGH (ref 4.6–6.5)

## 2019-04-06 ENCOUNTER — Encounter: Payer: Self-pay | Admitting: Physician Assistant

## 2019-04-08 ENCOUNTER — Other Ambulatory Visit: Payer: Self-pay | Admitting: Physician Assistant

## 2019-04-10 ENCOUNTER — Other Ambulatory Visit: Payer: Self-pay | Admitting: Physician Assistant

## 2019-04-12 DIAGNOSIS — M546 Pain in thoracic spine: Secondary | ICD-10-CM | POA: Diagnosis not present

## 2019-04-12 DIAGNOSIS — M9902 Segmental and somatic dysfunction of thoracic region: Secondary | ICD-10-CM | POA: Diagnosis not present

## 2019-04-12 DIAGNOSIS — M542 Cervicalgia: Secondary | ICD-10-CM | POA: Diagnosis not present

## 2019-04-12 DIAGNOSIS — M9901 Segmental and somatic dysfunction of cervical region: Secondary | ICD-10-CM | POA: Diagnosis not present

## 2019-04-19 ENCOUNTER — Encounter: Payer: Self-pay | Admitting: Physician Assistant

## 2019-04-28 ENCOUNTER — Encounter: Payer: Self-pay | Admitting: Physician Assistant

## 2019-05-01 ENCOUNTER — Encounter: Payer: Self-pay | Admitting: Physician Assistant

## 2019-05-01 ENCOUNTER — Ambulatory Visit (INDEPENDENT_AMBULATORY_CARE_PROVIDER_SITE_OTHER): Payer: Medicare Other | Admitting: Physician Assistant

## 2019-05-01 ENCOUNTER — Other Ambulatory Visit: Payer: Self-pay

## 2019-05-01 VITALS — BP 100/70 | HR 78 | Temp 99.2°F | Resp 14 | Ht 63.0 in | Wt 166.0 lb

## 2019-05-01 DIAGNOSIS — E119 Type 2 diabetes mellitus without complications: Secondary | ICD-10-CM

## 2019-05-01 MED ORDER — INSULIN DEGLUDEC 100 UNIT/ML ~~LOC~~ SOPN: 30.0000 [IU] | PEN_INJECTOR | Freq: Every day | SUBCUTANEOUS | 2 refills | Status: DC

## 2019-05-01 NOTE — Patient Instructions (Signed)
Please stop the Novolin and start the Tresiba at 30 units once daily. Continue Metformin as directed. Keep consistent with diet and glucose checks. I will send you a message at the end of the week to see how sugars are so we can start titrating again. Should not see any more low numbers with this -- no spikes and dips  Please start a daily fiber supplement so we can bulk your stools. If prior insulin was contributing we should note some change.  You will be contacted regarding a screening colonoscopy.   Hang in there!

## 2019-05-01 NOTE — Progress Notes (Signed)
Patient presents to clinic today for follow up of type 2 diabetes mellitus. Patient currently on Metformin XR 500 mg BID and Novolin 70/30 16 units in AM and 26 units in PM since 10/23. Fasting blood glucose has been in 150s-200s. PM glucose in 200s, reports occasional readings below 100 after taking insulin. Notes felling "shaky" when her glucose is below 100. Denies changes in vision, headaches, nocturia, lower extremity tingling or numbness. Eye appointment scheduled for January.   Past Medical History:  Diagnosis Date  . Anxiety   . Diabetes mellitus without complication (HCC)    per pt. borderline diabetic  . Fibrocystic disease of breast   . Mood swings     Current Outpatient Medications on File Prior to Visit  Medication Sig Dispense Refill  . ALPRAZolam (XANAX) 0.25 MG tablet Take 1 tablet (0.25 mg total) by mouth every 12 (twelve) hours as needed. for anxiety 30 tablet 0  . atorvastatin (LIPITOR) 20 MG tablet Take 1 tablet (20 mg total) by mouth at bedtime. 90 tablet 2  . Blood Glucose Monitoring Suppl ([place]) KIT 1 kit by Does not apply route daily. Dx: E11.9 1 each 0  . Calcium Carb-Cholecalciferol (CALCIUM-VITAMIN D) 600-400 MG-UNIT TABS Take 2 each by mouth daily.    Marland Kitchen glucose blood (TRUE METRIX BLOOD GLUCOSE TEST) test strip Check blood sugars daily. Dx: E11.9 300 each 3  . Insulin Pen Needle (BD PEN NEEDLE NANO U/F) 32G X 4 MM MISC USE DAILY AS DIRECTED 100 each 3  . metFORMIN (GLUCOPHAGE-XR) 500 MG 24 hr tablet Take 2 tablets by mouth twice daily with meals. 360 tablet 1  . OVER THE COUNTER MEDICATION Multiple Vitamins-Minerals-Take 1 tablet by mouth daily.    . ramipril (ALTACE) 2.5 MG capsule TAKE 1 CAPSULE(2.5 MG) BY MOUTH DAILY 90 capsule 0  . sertraline (ZOLOFT) 100 MG tablet Take 1.5 tablets (150 mg total) by mouth daily. 135 tablet 1   No current facility-administered medications on file prior to visit.     No Known Allergies  Family History   Problem Relation Age of Onset  . Stroke Mother   . Heart attack Father   . Alzheimer's disease Sister        #1-Deceased  . Breast cancer Sister        #1  . Healthy Sister        #2    Social History   Socioeconomic History  . Marital status: Married    Spouse name: Not on file  . Number of children: 1  . Years of education: Not on file  . Highest education level: Not on file  Occupational History  . Occupation: Statistician  Social Needs  . Financial resource strain: Not on file  . Food insecurity    Worry: Not on file    Inability: Not on file  . Transportation needs    Medical: Not on file    Non-medical: Not on file  Tobacco Use  . Smoking status: Never Smoker  . Smokeless tobacco: Never Used  Substance and Sexual Activity  . Alcohol use: Yes    Alcohol/week: 0.0 standard drinks    Comment: occas.  . Drug use: No  . Sexual activity: Yes    Partners: Male  Lifestyle  . Physical activity    Days per week: Not on file    Minutes per session: Not on file  . Stress: Not on file  Relationships  . Social connections  Talks on phone: Not on file    Gets together: Not on file    Attends religious service: Not on file    Active member of club or organization: Not on file    Attends meetings of clubs or organizations: Not on file    Relationship status: Not on file  Other Topics Concern  . Not on file  Social History Narrative  . Not on file   Review of Systems - See HPI.  All other ROS are negative.  BP 100/70   Pulse 78   Temp 99.2 F (37.3 C) (Temporal)   Resp 14   Ht '5\' 3"'$  (1.6 m)   Wt 166 lb (75.3 kg)   SpO2 98%   BMI 29.41 kg/m   Physical Exam Vitals signs reviewed.  HENT:     Head: Normocephalic and atraumatic.  Neck:     Musculoskeletal: Neck supple.  Cardiovascular:     Rate and Rhythm: Normal rate and regular rhythm.     Pulses: Normal pulses.  Pulmonary:     Effort: Pulmonary effort is normal.  Neurological:     General: No  focal deficit present.     Mental Status: She is alert.  Psychiatric:        Mood and Affect: Mood normal.    Diabetic Foot Form - Detailed   Diabetic Foot Exam - detailed Visual Foot Exam completed.: Yes  Can the patient see the bottom of their feet?: Yes Are the shoes appropriate in style and fit?: Yes Is there swelling or and abnormal foot shape?: No Is there a claw toe deformity?: No Is there elevated skin temparature?: No Is there foot or ankle muscle weakness?: No Normal Range of Motion: Yes Pulse Foot Exam completed.: Yes  Right posterior Tibialias: Present Left posterior Tibialias: Present  Right Dorsalis Pedis: Present Left Dorsalis Pedis: Present  Sensory Foot Exam Completed.: Yes Semmes-Weinstein Monofilament Test R Site 1-Great Toe: Pos L Site 1-Great Toe: Pos         Recent Results (from the past 2160 hour(s))  Basic metabolic panel     Status: Abnormal   Collection Time: 03/23/19 10:11 AM  Result Value Ref Range   Sodium 142 135 - 145 mEq/L   Potassium 4.3 3.5 - 5.1 mEq/L   Chloride 105 96 - 112 mEq/L   CO2 27 19 - 32 mEq/L   Glucose, Bld 108 (H) 70 - 99 mg/dL   BUN 12 6 - 23 mg/dL   Creatinine, Ser 0.78 0.40 - 1.20 mg/dL   Calcium 9.9 8.4 - 10.5 mg/dL   GFR 73.39 >60.00 mL/min  Hemoglobin A1c     Status: Abnormal   Collection Time: 03/23/19 10:11 AM  Result Value Ref Range   Hgb A1c MFr Bld 8.1 (H) 4.6 - 6.5 %    Comment: Glycemic Control Guidelines for People with Diabetes:Non Diabetic:  <6%Goal of Therapy: <7%Additional Action Suggested:  >8%    Assessment/Plan: 1. Type 2 diabetes mellitus without complication, without long-term current use of insulin ([place]) Still above goal but having episodes of hypoglycemia with current regimen. Is not consistent with meal choices but consisitent with timing. Snacks all evening which is affecting AM glucose. Will continue Metformin XR 500 mg BID. Will stop her Novolin 70/30. Start Tresiba at 30 units once daily for  now. Dietary recommendations reviewed. Follow-up 1 week via MyChart with glucose log so we can reassess and start titration schedule. She may require prandial SSI. Foot exam updated today.  No concerning findings.    Leeanne Rio, PA-C

## 2019-05-09 ENCOUNTER — Other Ambulatory Visit: Payer: Self-pay | Admitting: Emergency Medicine

## 2019-05-09 ENCOUNTER — Encounter: Payer: Self-pay | Admitting: Physician Assistant

## 2019-05-09 DIAGNOSIS — Z794 Long term (current) use of insulin: Secondary | ICD-10-CM

## 2019-05-09 DIAGNOSIS — E119 Type 2 diabetes mellitus without complications: Secondary | ICD-10-CM

## 2019-05-09 MED ORDER — BD PEN NEEDLE NANO U/F 32G X 4 MM MISC: 3 refills | Status: DC

## 2019-05-10 ENCOUNTER — Encounter: Payer: Self-pay | Admitting: Physician Assistant

## 2019-05-10 DIAGNOSIS — M542 Cervicalgia: Secondary | ICD-10-CM | POA: Diagnosis not present

## 2019-05-10 DIAGNOSIS — M9901 Segmental and somatic dysfunction of cervical region: Secondary | ICD-10-CM | POA: Diagnosis not present

## 2019-05-10 DIAGNOSIS — M546 Pain in thoracic spine: Secondary | ICD-10-CM | POA: Diagnosis not present

## 2019-05-10 DIAGNOSIS — M9902 Segmental and somatic dysfunction of thoracic region: Secondary | ICD-10-CM | POA: Diagnosis not present

## 2019-05-16 ENCOUNTER — Encounter: Payer: Self-pay | Admitting: Physician Assistant

## 2019-05-19 ENCOUNTER — Other Ambulatory Visit: Payer: Self-pay | Admitting: Emergency Medicine

## 2019-05-19 ENCOUNTER — Encounter: Payer: Self-pay | Admitting: Physician Assistant

## 2019-05-19 DIAGNOSIS — F32A Depression, unspecified: Secondary | ICD-10-CM

## 2019-05-19 DIAGNOSIS — E1169 Type 2 diabetes mellitus with other specified complication: Secondary | ICD-10-CM

## 2019-05-19 DIAGNOSIS — F329 Major depressive disorder, single episode, unspecified: Secondary | ICD-10-CM

## 2019-05-19 MED ORDER — RAMIPRIL 2.5 MG PO CAPS: ORAL_CAPSULE | ORAL | 1 refills | Status: DC

## 2019-05-19 MED ORDER — SERTRALINE HCL 100 MG PO TABS: 150.0000 mg | ORAL_TABLET | Freq: Every day | ORAL | 1 refills | Status: DC

## 2019-05-19 MED ORDER — ATORVASTATIN CALCIUM 20 MG PO TABS: 20.0000 mg | ORAL_TABLET | Freq: Every day | ORAL | 1 refills | Status: DC

## 2019-05-31 ENCOUNTER — Other Ambulatory Visit: Payer: Self-pay

## 2019-06-03 ENCOUNTER — Encounter: Payer: Self-pay | Admitting: Physician Assistant

## 2019-06-08 DIAGNOSIS — M9902 Segmental and somatic dysfunction of thoracic region: Secondary | ICD-10-CM | POA: Diagnosis not present

## 2019-06-08 DIAGNOSIS — M9901 Segmental and somatic dysfunction of cervical region: Secondary | ICD-10-CM | POA: Diagnosis not present

## 2019-06-08 DIAGNOSIS — M546 Pain in thoracic spine: Secondary | ICD-10-CM | POA: Diagnosis not present

## 2019-06-08 DIAGNOSIS — M542 Cervicalgia: Secondary | ICD-10-CM | POA: Diagnosis not present

## 2019-06-13 ENCOUNTER — Encounter: Payer: Self-pay | Admitting: Physician Assistant

## 2019-06-19 ENCOUNTER — Other Ambulatory Visit: Payer: Self-pay

## 2019-06-19 MED ORDER — METFORMIN HCL ER 500 MG PO TB24: ORAL_TABLET | ORAL | 1 refills | Status: DC

## 2019-06-22 ENCOUNTER — Encounter: Payer: Self-pay | Admitting: Physician Assistant

## 2019-07-26 ENCOUNTER — Encounter: Payer: Self-pay | Admitting: Emergency Medicine

## 2019-07-26 DIAGNOSIS — E119 Type 2 diabetes mellitus without complications: Secondary | ICD-10-CM | POA: Diagnosis not present

## 2019-07-26 LAB — HM DIABETES EYE EXAM

## 2019-07-29 ENCOUNTER — Ambulatory Visit: Payer: Medicare Other | Attending: Internal Medicine

## 2019-07-29 DIAGNOSIS — Z23 Encounter for immunization: Secondary | ICD-10-CM | POA: Insufficient documentation

## 2019-07-29 NOTE — Progress Notes (Signed)
   Covid-19 Vaccination Clinic  Name:  Tonya Tapia    MRN: [id number] DOB: 1950-12-19  07/29/2019  Tonya Tapia was observed post Covid-19 immunization for 15 minutes without incidence. She was provided with Vaccine Information Sheet and instruction to access the V-Safe system.   Tonya Tapia was instructed to call 911 with any severe reactions post vaccine: Marland Kitchen Difficulty breathing  . Swelling of your face and throat  . A fast heartbeat  . A bad rash all over your body  . Dizziness and weakness    Immunizations Administered    Name Date Dose VIS Date Route   Pfizer COVID-19 Vaccine 07/29/2019  2:41 PM 0.3 mL 06/16/2019 Intramuscular   Manufacturer: [place]   Lot: [id number]   [place]: [id number]

## 2019-07-31 ENCOUNTER — Ambulatory Visit (INDEPENDENT_AMBULATORY_CARE_PROVIDER_SITE_OTHER): Payer: Medicare Other | Admitting: Physician Assistant

## 2019-07-31 ENCOUNTER — Encounter: Payer: Self-pay | Admitting: Physician Assistant

## 2019-07-31 ENCOUNTER — Other Ambulatory Visit: Payer: Self-pay

## 2019-07-31 VITALS — BP 140/73 | HR 68 | Temp 96.6°F

## 2019-07-31 DIAGNOSIS — F5101 Primary insomnia: Secondary | ICD-10-CM

## 2019-07-31 DIAGNOSIS — E119 Type 2 diabetes mellitus without complications: Secondary | ICD-10-CM

## 2019-07-31 MED ORDER — BELSOMRA 10 MG PO TABS: 10.0000 mg | ORAL_TABLET | Freq: Every day | ORAL | 0 refills | Status: DC

## 2019-07-31 NOTE — Progress Notes (Signed)
Virtual Visit via Video   I connected with patient on 07/31/19 at 10:00 AM EST by a video enabled telemedicine application and verified that I am speaking with the correct person using two identifiers.  Location patient: Home Location provider: [facility], Office Persons participating in the virtual visit: Patient, Provider, [place] (Patina Moore)  I discussed the limitations of evaluation and management by telemedicine and the availability of in person appointments. The patient expressed understanding and agreed to proceed.  Subjective:   HPI:   Patient presents via doxy.need today for follow-up of diabetes type 2.  Patient currently on a regimen of Tresiba, increased to 35 units daily.  Patient endorses taking medication as directed.  Notes her fasting glucose averages anywhere from 90-193.  Typically states it is around one of these two extremes.  Does note that when AM glucose is high it is after she has been up throughout the night.  Notes she typically will snack if she cannot sleep, which is a common occurrence.  Notes going to bed around 9 every night, sometimes not falling asleep until 4 AM.  In regards to afternoon glucose, this is also highly variable.  Has some lows down to the mid 79s.  Other extreme up to the low 300s.  States this also coincides with her diet.  Typically eats lunch around 12:48 PM.  Does not eat again until 6 or 7 at night.  Will note low levels when she did not eat much for lunch.  High numbers are rare but typically associated with snacking.  Denies any vision changes, polyuria, polydipsia, polyphagia.  Does note occasional early a.m. tingling of hands and feet bilaterally.  ROS:   See pertinent positives and negatives per HPI.  Patient Active Problem List   Diagnosis Date Noted  . Hyperlipidemia associated with type 2 diabetes mellitus ([place]) 10/11/2017  . Hypomagnesemia   . S/P cholecystectomy   . Choledocholithiasis with chronic cholecystitis     . Abnormal LFTs 04/02/2017  . Osteopenia 07/16/2016  . Vitamin D deficiency 07/16/2016  . Postmenopausal estrogen deficiency 06/15/2016  . Type 2 diabetes mellitus without complication, without long-term current use of insulin ([place]) 01/29/2015  . Anxiety and depression 01/29/2015  . Breast cancer screening 01/29/2015  . RESTLESS LEG SYNDROME 07/04/2007  . MIGRAINE, MENSTRUAL 07/04/2007  . PSORIASIS 07/04/2007  . History of partial thyroidectomy 07/04/2007    Social History   Tobacco Use  . Smoking status: Never Smoker  . Smokeless tobacco: Never Used  Substance Use Topics  . Alcohol use: Yes    Alcohol/week: 0.0 standard drinks    Comment: occas.    Current Outpatient Medications:  .  ALPRAZolam (XANAX) 0.25 MG tablet, Take 1 tablet (0.25 mg total) by mouth every 12 (twelve) hours as needed. for anxiety, Disp: 30 tablet, Rfl: 0 .  atorvastatin (LIPITOR) 20 MG tablet, Take 1 tablet (20 mg total) by mouth at bedtime., Disp: 90 tablet, Rfl: 1 .  Blood Glucose Monitoring Suppl ([place]) KIT, 1 kit by Does not apply route daily. Dx: E11.9, Disp: 1 each, Rfl: 0 .  Calcium Carb-Cholecalciferol (CALCIUM-VITAMIN D) 600-400 MG-UNIT TABS, Take 2 each by mouth daily., Disp: , Rfl:  .  glucose blood (TRUE METRIX BLOOD GLUCOSE TEST) test strip, Check blood sugars daily. Dx: E11.9, Disp: 300 each, Rfl: 3 .  insulin degludec (TRESIBA) 100 UNIT/ML SOPN FlexTouch Pen, Inject 0.3 mLs (30 Units total) into the skin daily. Titrating to max of 50 units per  day (Patient taking differently: Inject 35 Units into the skin every morning. Titrating to max of 50 units per day), Disp: 4 pen, Rfl: 2 .  Insulin Pen Needle (BD PEN NEEDLE NANO U/F) 32G X 4 MM MISC, USE DAILY AS DIRECTED, Disp: 100 each, Rfl: 3 .  metFORMIN (GLUCOPHAGE-XR) 500 MG 24 hr tablet, Take 2 tablets by mouth twice daily with meals., Disp: 360 tablet, Rfl: 1 .  OVER THE COUNTER MEDICATION, Multiple Vitamins-Minerals-Take 1  tablet by mouth daily., Disp: , Rfl:  .  ramipril (ALTACE) 2.5 MG capsule, TAKE 1 CAPSULE(2.5 MG) BY MOUTH DAILY, Disp: 90 capsule, Rfl: 1 .  sertraline (ZOLOFT) 100 MG tablet, Take 1.5 tablets (150 mg total) by mouth daily., Disp: 135 tablet, Rfl: 1  No Known Allergies  Objective:   BP 140/73   Pulse 68   Temp (!) 96.6 F (35.9 C) (Oral)   Patient is well-developed, well-nourished in no acute distress.  Resting comfortably at home.  Head is normocephalic, atraumatic.  No labored breathing.  Speech is clear and coherent with logical content.  Patient is alert and oriented at baseline.   Assessment and Plan:   1. Primary insomnia Significant issue with sleep onset.  When she falls asleep she will generally stay asleep.  Unfortunately only averaging a couple of hours of sleep per night.  Is up snacking while not sleeping which is having a detrimental effect on her sugars.  Discussed options for treatment.  Will start trial of Belsomra 10 mg once nightly over the next 7 to 10 days.  Instructions given to patient on downloading a free trial voucher.  Follow-up via MyChart in 5 to 7 days.  Will make further adjustments at that time.  2. Type 2 diabetes mellitus without complication, without long-term current use of insulin (HCC) Fluctuating glucose levels due to significant inconsistency with dietary intake.  Hopefully helping with sleep will cut down on late night snacking which will help keep her a.m. fasting sugars more stable.  When snacking is not involved, a.m. sugars around 92 which is considered at goal.  She is to start to have a small healthy snack between lunch and dinner.  Encouraged her to be very consistent with timing of meals.  This has previously been discussed but she seems more willing to work on this at present time.  Continue logging glucose levels.  Follow-up in 1 to 2 weeks via MyChart with recordings.  Will make further adjustments at that time.  Patient is due for foot  exam.  Will complete at next in office visit.  Patient is due for repeat labs.  She is to schedule a lab appointment.    Leeanne Rio, [place] 07/31/2019

## 2019-07-31 NOTE — Progress Notes (Signed)
I have discussed the procedure for the virtual visit with the patient who has given consent to proceed with assessment and treatment.   Romaldo Saville S Kinsler Soeder, CMA     

## 2019-07-31 NOTE — Patient Instructions (Signed)
Instructions sent to MyChart.  Please follow the sleep hygiene practices below. Start the Belsomra once nightly. Go to [email or web address] to download a free trial voucher. This will entitle you to a free trial of 10 tablets of each dose. Message me or so days to let me know how this is working. We will make further adjustments to this at that time.  Please continue current dose of insulin. Please work very hard on making sure you are consistent with mealtimes. Try to keep a healthy snack on hand - ex: apple and peanut butter, handful of mixed nuts, lean protein, snack bar, etc - to have between meals to prevent dips in glucose level. Keep checking glucose twice daily and record.  Message me these readings in 1 week.  Sleep Hygiene  Do: (1) Go to bed at the same time each day. (2) Get up from bed at the same time each day. (3) Get regular exercise each day, preferably in the morning.  There is goof evidence that regular exercise improves restful sleep.  This includes stretching and aerobic exercise. (4) Get regular exposure to outdoor or bright lights, especially in the late afternoon. (5) Keep the temperature in your bedroom comfortable. (6) Keep the bedroom quiet when sleeping. (7) Keep the bedroom dark enough to facilitate sleep. (8) Use your bed only for sleep and sex. (9) Take medications as directed.  It is helpful to take prescribed sleeping pills 1 hour before bedtime, so they are causing drowsiness when you lie down, or 10 hours before getting up, to avoid daytime drowsiness. (10) Use a relaxation exercise just before going to sleep -- imagery, massage, warm bath. (11) Keep your feet and hands warm.  Wear warm socks and/or mittens or gloves to bed.  Don't: (1) Exercise just before going to bed. (2) Engage in stimulating activity just before bed, such as playing a competitive game, watching an exciting program on television, or having an important discussion with a loved  one. (3) Have caffeine in the evening (coffee, teas, chocolate, sodas, etc.) (4) Read or watch television in bed. (5) Use alcohol to help you sleep. (6) Go to bed too hungry or too full. (7) Take another person's sleeping pills. (8) Take over-the-counter sleeping pills, without your doctor's knowledge.  Tolerance can develop rapidly with these medications.  Diphenhydramine can have serious side effects for elderly patients. (9) Take daytime naps. (10) Command yourself to go to sleep.  This only makes your mind and body more alert.  If you lie awake for more than 20-30 minutes, get up, go to a different room, participate in a quiet activity (Ex - non-excitable reading or television), and then return to bed when you feel sleepy.  Do this as many times during the night as needed.  This may cause you to have a night or two of poor sleep but it will train your brain to know when it is time for sleep.

## 2019-08-20 ENCOUNTER — Ambulatory Visit: Payer: Medicare Other | Attending: Internal Medicine

## 2019-08-20 DIAGNOSIS — Z23 Encounter for immunization: Secondary | ICD-10-CM | POA: Insufficient documentation

## 2019-08-20 NOTE — Progress Notes (Signed)
   Covid-19 Vaccination Clinic  Name:  Tonya Tapia    MRN: [id number] DOB: 1951-05-26  08/20/2019  [name] was observed post Covid-19 immunization for 15 minutes without incidence. She was provided with Vaccine Information Sheet and instruction to access the V-Safe system.   [name] was instructed to call 911 with any severe reactions post vaccine: Marland Kitchen Difficulty breathing  . Swelling of your face and throat  . A fast heartbeat  . A bad rash all over your body  . Dizziness and weakness    Immunizations Administered    Name Date Dose VIS Date Route   Pfizer COVID-19 Vaccine 08/20/2019 10:13 AM 0.3 mL 06/16/2019 Intramuscular   Manufacturer: [place]   Lot: [id number]   [place]: [id number]

## 2019-08-30 ENCOUNTER — Encounter: Payer: Self-pay | Admitting: Physician Assistant

## 2019-09-11 ENCOUNTER — Other Ambulatory Visit: Payer: Self-pay | Admitting: Physician Assistant

## 2019-09-26 ENCOUNTER — Encounter: Payer: Self-pay | Admitting: Physician Assistant

## 2019-10-03 ENCOUNTER — Other Ambulatory Visit: Payer: Self-pay

## 2019-10-03 ENCOUNTER — Ambulatory Visit (INDEPENDENT_AMBULATORY_CARE_PROVIDER_SITE_OTHER): Payer: Medicare Other

## 2019-10-03 DIAGNOSIS — E119 Type 2 diabetes mellitus without complications: Secondary | ICD-10-CM | POA: Diagnosis not present

## 2019-10-03 LAB — COMPREHENSIVE METABOLIC PANEL
ALT: 39 U/L — ABNORMAL HIGH (ref 0–35)
AST: 33 U/L (ref 0–37)
Albumin: 3.8 g/dL (ref 3.5–5.2)
Alkaline Phosphatase: 79 U/L (ref 39–117)
BUN: 11 mg/dL (ref 6–23)
CO2: 29 mEq/L (ref 19–32)
Calcium: 9.2 mg/dL (ref 8.4–10.5)
Chloride: 104 mEq/L (ref 96–112)
Creatinine, Ser: 0.75 mg/dL (ref 0.40–1.20)
GFR: 76.67 mL/min (ref >60.00)
Glucose, Bld: 243 mg/dL — ABNORMAL HIGH (ref 70–99)
Potassium: 4.2 mEq/L (ref 3.5–5.1)
Sodium: 138 mEq/L (ref 135–145)
Total Bilirubin: 0.4 mg/dL (ref 0.2–1.2)
Total Protein: 6.1 g/dL (ref 6.0–8.3)

## 2019-10-03 LAB — HEMOGLOBIN A1C: Hgb A1c MFr Bld: 7.3 % — ABNORMAL HIGH (ref 4.6–6.5)

## 2019-10-10 ENCOUNTER — Telehealth: Payer: Self-pay | Admitting: Physician Assistant

## 2019-10-10 NOTE — Progress Notes (Signed)
  Chronic Care Management   Note  10/10/2019 Name: Tonya Tapia MRN: [id number] DOB: [birth year]  Tonya Tapia is a 69 y.o. year old female who is a primary care patient of Delorse Limber. I reached out to [name] by phone today in response to a referral sent by Tonya Tapia PCP, Brunetta Jeans, PA-C.   Tonya Tapia was given information about Chronic Care Management services today including:  1. CCM service includes personalized support from designated clinical staff supervised by her physician, including individualized plan of care and coordination with other care providers 2. 24/7 contact phone numbers for assistance for urgent and routine care needs. 3. Service will only be billed when office clinical staff spend 20 minutes or more in a month to coordinate care. 4. Only one practitioner may furnish and bill the service in a calendar month. 5. The patient may stop CCM services at any time (effective at the end of the month) by phone call to the office staff.   Patient agreed to services and verbal consent obtained.   Follow up plan:   Tonya Tapia Up[employer] Scheduler

## 2019-10-20 ENCOUNTER — Other Ambulatory Visit: Payer: Self-pay | Admitting: Emergency Medicine

## 2019-10-20 DIAGNOSIS — E119 Type 2 diabetes mellitus without complications: Secondary | ICD-10-CM

## 2019-10-20 DIAGNOSIS — E785 Hyperlipidemia, unspecified: Secondary | ICD-10-CM

## 2019-10-20 DIAGNOSIS — E1169 Type 2 diabetes mellitus with other specified complication: Secondary | ICD-10-CM

## 2019-10-23 ENCOUNTER — Ambulatory Visit: Payer: Medicare Other

## 2019-10-23 DIAGNOSIS — F5101 Primary insomnia: Secondary | ICD-10-CM

## 2019-10-23 DIAGNOSIS — E785 Hyperlipidemia, unspecified: Secondary | ICD-10-CM

## 2019-10-23 DIAGNOSIS — E119 Type 2 diabetes mellitus without complications: Secondary | ICD-10-CM

## 2019-10-23 DIAGNOSIS — F32A Depression, unspecified: Secondary | ICD-10-CM

## 2019-10-23 DIAGNOSIS — F329 Major depressive disorder, single episode, unspecified: Secondary | ICD-10-CM

## 2019-10-23 DIAGNOSIS — E1169 Type 2 diabetes mellitus with other specified complication: Secondary | ICD-10-CM

## 2019-10-23 NOTE — Patient Instructions (Addendum)
Visit Information  Goals Addressed            This Visit's Progress   . PharmD Care Plan       CARE PLAN ENTRY Current Barriers:  . Chronic Disease Management support, education, and care coordination needs related to  high cholesterol, high blood pressure, diabetes type 2, insomnia, depression/anxiety.  Pharmacist Clinical Goal(s):  Marland Kitchen Over next three months: BP <140/90, fasting blood glucose 100-130, non-fasting blood glucose 100-180, practice good sleep hygiene, continue sertraline 100 mg daily and alprazolam 0.25 mg as needed.   Interventions: . Comprehensive medication review performed. . Implemented blood glucose and blood pressure monitoring plan.   Patient Self Care Activities:  . Patient verbalizes understanding of plan to monitor blood pressure weekly and when feeling dizzy and to monitor blood glucose twice daily and when feeling dizzy over next 3 months. Please call with any questions!  Initial goal documentation       Tonya Tapia was given information about Chronic Care Management services today including:  1. CCM service includes personalized support from designated clinical staff supervised by her physician, including individualized plan of care and coordination with other care providers 2. 24/7 contact phone numbers for assistance for urgent and routine care needs. 3. Standard insurance, coinsurance, copays and deductibles apply for chronic care management only during months in which we provide at least 20 minutes of these services. Most insurances cover these services at 100%, however patients may be responsible for any copay, coinsurance and/or deductible if applicable. This service may help you avoid the need for more expensive face-to-face services. 4. Only one practitioner may furnish and bill the service in a calendar month. 5. The patient may stop CCM services at any time (effective at the end of the month) by phone call to the office staff.  Patient agreed  to services and verbal consent obtained.   The patient verbalized understanding of instructions provided today and agreed to receive a mailed copy of patient instruction and/or educational materials. Telephone follow up appointment with pharmacy team member scheduled for: See next appointment with "Care Management Staff" under "What's Next" below.   Thank you!  Tonya Tapia, Pharm.D. Clinical Pharmacist [facility] [phone] Hypertension, Adult High blood pressure (hypertension) is when the force of blood pumping through the arteries is too strong. The arteries are the blood vessels that carry blood from the heart throughout the body. Hypertension forces the heart to work harder to pump blood and may cause arteries to become narrow or stiff. Untreated or uncontrolled hypertension can cause a heart attack, heart failure, a stroke, kidney disease, and other problems. A blood pressure reading consists of a higher number over a lower number. Ideally, your blood pressure should be below 120/80. The first ("top") number is called the systolic pressure. It is a measure of the pressure in your arteries as your heart beats. The second ("bottom") number is called the diastolic pressure. It is a measure of the pressure in your arteries as the heart relaxes. What are the causes? The exact cause of this condition is not known. There are some conditions that result in or are related to high blood pressure. What increases the risk? Some risk factors for high blood pressure are under your control. The following factors may make you more likely to develop this condition:  Smoking.  Having type 2 diabetes mellitus, high cholesterol, or both.  Not getting enough exercise or physical activity.  Being overweight.  Having too much fat, sugar, calories, or salt (sodium) in your diet.  Drinking too much alcohol. Some risk factors for high blood pressure may be difficult or  impossible to change. Some of these factors include:  Having chronic kidney disease.  Having a family history of high blood pressure.  Age. Risk increases with age.  Race. You may be at higher risk if you are African American.  Gender. Men are at higher risk than women before age 49. After age 69, women are at higher risk than men.  Having obstructive sleep apnea.  Stress. What are the signs or symptoms? High blood pressure may not cause symptoms. Very high blood pressure (hypertensive crisis) may cause:  Headache.  Anxiety.  Shortness of breath.  Nosebleed.  Nausea and vomiting.  Vision changes.  Severe chest pain.  Seizures. How is this diagnosed? This condition is diagnosed by measuring your blood pressure while you are seated, with your arm resting on a flat surface, your legs uncrossed, and your feet flat on the floor. The cuff of the blood pressure monitor will be placed directly against the skin of your upper arm at the level of your heart. It should be measured at least twice using the same arm. Certain conditions can cause a difference in blood pressure between your right and left arms. Certain factors can cause blood pressure readings to be lower or higher than normal for a short period of time:  When your blood pressure is higher when you are in a health care provider's office than when you are at home, this is called white coat hypertension. Most people with this condition do not need medicines.  When your blood pressure is higher at home than when you are in a health care provider's office, this is called masked hypertension. Most people with this condition may need medicines to control blood pressure. If you have a high blood pressure reading during one visit or you have normal blood pressure with other risk factors, you may be asked to:  Return on a different day to have your blood pressure checked again.  Monitor your blood pressure at home for 1 week or  longer. If you are diagnosed with hypertension, you may have other blood or imaging tests to help your health care provider understand your overall risk for other conditions. How is this treated? This condition is treated by making healthy lifestyle changes, such as eating healthy foods, exercising more, and reducing your alcohol intake. Your health care provider may prescribe medicine if lifestyle changes are not enough to get your blood pressure under control, and if:  Your systolic blood pressure is above 130.  Your diastolic blood pressure is above 80. Your personal target blood pressure may vary depending on your medical conditions, your age, and other factors. Follow these instructions at home: Eating and drinking   Eat a diet that is high in fiber and potassium, and low in sodium, added sugar, and fat. An example eating plan is called the DASH (Dietary Approaches to Stop Hypertension) diet. To eat this way: ? Eat plenty of fresh fruits and vegetables. Try to fill one half of your plate at each meal with fruits and vegetables. ? Eat whole grains, such as whole-wheat pasta, brown rice, or whole-grain bread. Fill about one fourth of your plate with whole grains. ? Eat or drink low-fat dairy products, such as skim milk or low-fat yogurt. ? Avoid fatty cuts of meat, processed or cured meats, and poultry with skin.  Fill about one fourth of your plate with lean proteins, such as fish, chicken without skin, beans, eggs, or tofu. ? Avoid pre-made and processed foods. These tend to be higher in sodium, added sugar, and fat.  Reduce your daily sodium intake. Most people with hypertension should eat less than 1,500 mg of sodium a day.  Do not drink alcohol if: ? Your health care provider tells you not to drink. ? You are pregnant, may be pregnant, or are planning to become pregnant.  If you drink alcohol: ? Limit how much you use to:  0-1 drink a day for women.  0-2 drinks a day for  men. ? Be aware of how much alcohol is in your drink. In the [place] one drink equals one 12 oz bottle of beer (355 mL), one 5 oz glass of wine (148 mL), or one 1 oz glass of hard liquor (44 mL). Lifestyle   Work with your health care provider to maintain a healthy body weight or to lose weight. Ask what an ideal weight is for you.  Get at least 30 minutes of exercise most days of the week. Activities may include walking, swimming, or biking.  Include exercise to strengthen your muscles (resistance exercise), such as Pilates or lifting weights, as part of your weekly exercise routine. Try to do these types of exercises for 30 minutes at least 3 days a week.  Do not use any products that contain nicotine or tobacco, such as cigarettes, e-cigarettes, and chewing tobacco. If you need help quitting, ask your health care provider.  Monitor your blood pressure at home as told by your health care provider.  Keep all follow-up visits as told by your health care provider. This is important. Medicines  Take over-the-counter and prescription medicines only as told by your health care provider. Follow directions carefully. Blood pressure medicines must be taken as prescribed.  Do not skip doses of blood pressure medicine. Doing this puts you at risk for problems and can make the medicine less effective.  Ask your health care provider about side effects or reactions to medicines that you should watch for. Contact a health care provider if you:  Think you are having a reaction to a medicine you are taking.  Have headaches that keep coming back (recurring).  Feel dizzy.  Have swelling in your ankles.  Have trouble with your vision. Get help right away if you:  Develop a severe headache or confusion.  Have unusual weakness or numbness.  Feel faint.  Have severe pain in your chest or abdomen.  Vomit repeatedly.  Have trouble breathing. Summary  Hypertension is when the force of blood  pumping through your arteries is too strong. If this condition is not controlled, it may put you at risk for serious complications.  Your personal target blood pressure may vary depending on your medical conditions, your age, and other factors. For most people, a normal blood pressure is less than 120/80.  Hypertension is treated with lifestyle changes, medicines, or a combination of both. Lifestyle changes include losing weight, eating a healthy, low-sodium diet, exercising more, and limiting alcohol. This information is not intended to replace advice given to you by your health care provider. Make sure you discuss any questions you have with your health care provider. Document Revised: 03/02/2018 Document Reviewed: 03/02/2018 Elsevier Patient Education  2020 [employer].

## 2019-10-23 NOTE — Progress Notes (Signed)
Chronic Care Management Pharmacy  Name: Tonya Tapia  MRN: [id number] DOB: 1950/11/01  Chief Complaint/ HPI Tonya Tapia,  69 y.o. , female presents for their Initial CCM visit with the clinical pharmacist via telephone due to COVID-19 Pandemic.  PCP : Tonya Jeans, PA-C  Their chronic conditions include: HLD, HTN, DM type II, insomnia, depression/anxiety  Office Visits:  3/23 (PCP): Tonya Tapia recommended decreasing by 1-2 units if still getting numbers <85.  Advised to schedule lab appointment, due for foot exam also.  1/25 (PCP): Variable blood sugars, revealing lows in 50s, occasional highs in low 300s. Difficulty sleep resulting in nighttime snacking. Started on Belsomra trial.   Medications: Outpatient Encounter Medications as of 10/23/2019  Medication Sig  . ALPRAZolam (XANAX) 0.25 MG tablet Take 1 tablet (0.25 mg total) by mouth every 12 (twelve) hours as needed. for anxiety  . atorvastatin (LIPITOR) 20 MG tablet Take 1 tablet (20 mg total) by mouth at bedtime.  . Blood Glucose Monitoring Suppl ([place]) KIT 1 kit by Does not apply route daily. Dx: E11.9  . Calcium Carb-Cholecalciferol (CALCIUM-VITAMIN D) 600-400 MG-UNIT TABS Take 2 each by mouth daily.  Marland Kitchen glucose blood (TRUE METRIX BLOOD GLUCOSE TEST) test strip Check blood sugars daily. Dx: E11.9  . Insulin Pen Needle (BD PEN NEEDLE NANO U/F) 32G X 4 MM MISC USE DAILY AS DIRECTED  . metFORMIN (GLUCOPHAGE-XR) 500 MG 24 hr tablet Take 2 tablets by mouth twice daily with meals.  Marland Kitchen OVER THE COUNTER MEDICATION Multiple Vitamins-Minerals-Take 1 tablet by mouth daily.  . ramipril (ALTACE) 2.5 MG capsule TAKE 1 CAPSULE(2.5 MG) BY MOUTH DAILY  . sertraline (ZOLOFT) 100 MG tablet Take 1.5 tablets (150 mg total) by mouth daily. (Patient taking differently: Take 100 mg by mouth daily. )  . TRESIBA FLEXTOUCH 100 UNIT/ML FlexTouch Pen INJECT 0.3 MLS (30 UNITS TOTAL) INTO THE SKIN DAILY. TITRATING TO MAX OF 50 UNITS  PER DAY  . Suvorexant (BELSOMRA) 10 MG TABS Take 10 mg by mouth at bedtime. (Patient not taking: Reported on 10/23/2019)   No facility-administered encounter medications on file as of 10/23/2019.   Current Diagnosis/Assessment: Goals Addressed            This Visit's Progress   . PharmD Care Plan       CARE PLAN ENTRY Current Barriers:  . Chronic Disease Management support, education, and care coordination needs related to  high cholesterol, high blood pressure, diabetes type 2, insomnia, depression/anxiety.  Pharmacist Clinical Goal(s):  Marland Kitchen Over next three months: BP <140/90, fasting blood glucose 100-130, non-fasting blood glucose 100-180, practice good sleep hygiene, continue sertraline 100 mg daily and alprazolam 0.25 mg as needed.   Interventions: . Comprehensive medication review performed. . Implemented blood glucose and blood pressure monitoring plan.   Patient Self Care Activities:  . Patient verbalizes understanding of plan to monitor blood pressure weekly and when feeling dizzy and to monitor blood glucose twice daily and when feeling dizzy over next 3 months. Please call with any questions!  Initial goal documentation      Hypertension  Office blood pressures are  BP Readings from Last 3 Encounters:  07/31/19 140/73  05/01/19 100/70  10/25/18 116/68   Patient home BP readings are ranging: test infrequently. Patient is currently controlled on the following medications: ramipril 2.5 mg daily.  We discussed diet and exercise and monitoring for blood pressure at least once weekly, especially in the context of feeling dizzy on occasion as  discussed below. Denies headache, SOB, chest pain.  Plan Continue current medications and monitor BP weekly and when feeling dizzy.     Diabetes   Recent Relevant Labs: Lab Results  Component Value Date/Time   HGBA1C 7.3 (H) 10/03/2019 11:29 AM   HGBA1C 8.1 (H) 03/23/2019 10:11 AM   MICROALBUR 1.3 04/11/2018 10:35 AM    MICROALBUR 2.4 (H) 01/29/2015 09:17 AM   Last diabetic eye exam:  Lab Results  Component Value Date/Time   HMDIABEYEEXA No Retinopathy 07/26/2019 12:00 AM   Patient is currently near goal A1lc<7% on the following medications: metformin 500 mg XR - two tablets twice daily, Tresiba 35 units daily.  A1c has improved from 9.7 to 7.3 since last April. Previously irregular fasting and non-fasting readings reported, including lows 50s-70s and highs occasionally in low 300s. Was checking am fasting blood sugars and evening blood sugars twice daily until receiving lab results on 4/4 and due to good news with a1c reduction had stopped.   I encouraged twice daily blood sugar readings and patient expressed willingness to resume. Mentions dizziness episode about 2 weeks ago, which may have been related to hypoglycemia. Was encouraged to monitor BP once weekly and both BG and BP at times of dizziness to keep a closer eye on this. Patient agreed to monitoring plan. Will follow-up with phone call in two weeks.  Plan Continue current medications and implement BG and BP monitoring plan.   Hyperlipidemia   Lipid Panel     Component Value Date/Time   CHOL 143 10/25/2018 1025   TRIG 105.0 10/25/2018 1025   HDL 54.40 10/25/2018 1025   CHOLHDL 3 10/25/2018 1025   VLDL 21.0 10/25/2018 1025   LDLCALC 68 10/25/2018 1025   LDLDIRECT 87.0 03/16/2016 1653    The 10-year ASCVD risk score Mikey Bussing DC Jr., et al., 2013) is: 14.9%   Values used to calculate the score:     Age: 32 years     Sex: Female     Is Non-Hispanic African American: No     Diabetic: Yes     Tobacco smoker: No     Systolic Blood Pressure: 643 mmHg     Is BP treated: No     HDL Cholesterol: 54.4 mg/dL     Total Cholesterol: 143 mg/dL   Cholesterol is at goal per 10/2018 lipid panel. Patient is currently controlled on the following medications: atorvastatin 20 mg daily. Denies any muscle or abdominal pain or n/v. No complaints at this time.  Reports that she has been walking 40 minutes daily for exercise.  Plan Continue current medications and control with diet and exercise.   Anxiety, depression, insomnia   Depression/anxiety is currently controlled on sertraline  100 mg daily which has been self-reduced from 150 mg daily. Expresses a continued benefit from medication and being satisfied with current dose. In the future would like to consider reducing dose further and tapering off. Advised patient to contact us if further dose reduction is desired.   Taking alprazolam 0.25 mg as needed for anxiety attacks. States alprazolam has not been used in at least 2-3 weeks weeks.    States that she tried 3-4 nights with suvorexant 10 mg to help with sleep and felt it did not help. Mentions sleep is still inconsistent, ranging from 2 to 6+ hours/night and routine sleep tends to be disrupted by RLS/nerve pain. Wishes to not take anything for sleep at this time. Sleep hygiene encouraged and has been previously discussed with patient.  Plan Continue sertraline 100 mg daily and alprazolam 0.25 mg daily.   Vaccines  Reviewed and discussed patient's vaccination history. Expresses that she is not willing to get Shingles, Tdap vaccine in near future due lack of perceived benefit. Encouraged routine vaccination and we agreed on discussing again at future visit, in at least 6 months.  Immunization History  Administered Date(s) Administered  . Fluad Quad(high Dose 65+) 03/23/2019  . Influenza Whole 03/19/2010  . Influenza, High Dose Seasonal PF 08/05/2016, 04/22/2017, 04/11/2018  . PFIZER SARS-COV-2 Vaccination 07/29/2019, 08/20/2019  . PPD Test 12/13/2013  . Pneumococcal Conjugate-13 03/16/2016  . Pneumococcal Polysaccharide-23 04/22/2017  . Td 07/06/1998, 01/02/2009   Plan Recommended patient receive Shingles and Tdap vaccine in office/pharmacy if she feels comfortable.  ______________ Visit Information SDOH (Social Determinants of  Health) assessments performed: Yes.   Food Insecurity: No Food Insecurity  . Worried About Charity fundraiser in the Last Year: Never true  . Ran Out of Food in the Last Year: Never true     Transportation Needs: No Transportation Needs  . Lack of Transportation (Medical): No  . Lack of Transportation (Non-Medical): No     Physical Activity: Sufficiently Active  . Days of Exercise per Week: 5 days  . Minutes of Exercise per Session: 40 min   Ms. Yeater was given information about Chronic Care Management services today including:  1. CCM service includes personalized support from designated clinical staff supervised by her physician, including individualized plan of care and coordination with other care providers 2. 24/7 contact phone numbers for assistance for urgent and routine care needs. 3. Standard insurance, coinsurance, copays and deductibles apply for chronic care management only during months in which we provide at least 20 minutes of these services. Most insurances cover these services at 100%, however patients may be responsible for any copay, coinsurance and/or deductible if applicable. This service may help you avoid the need for more expensive face-to-face services. 4. Only one practitioner may furnish and bill the service in a calendar month. 5. The patient may stop CCM services at any time (effective at the end of the month) by phone call to the office staff.  Patient agreed to services and verbal consent obtained.   Madelin Rear, Pharm.D. Clinical Pharmacist [facility] at [facility] [phone]

## 2019-10-31 ENCOUNTER — Telehealth: Payer: Self-pay | Admitting: Physician Assistant

## 2019-10-31 NOTE — Telephone Encounter (Signed)
Left message for patient to schedule Annual Wellness Visit.  Please schedule with Nurse Health Advisor Victoria Britt, RN at [facility]  

## 2019-11-11 ENCOUNTER — Other Ambulatory Visit: Payer: Self-pay | Admitting: Physician Assistant

## 2019-11-11 DIAGNOSIS — F419 Anxiety disorder, unspecified: Secondary | ICD-10-CM

## 2019-11-28 ENCOUNTER — Encounter: Payer: Self-pay | Admitting: Physician Assistant

## 2019-11-28 NOTE — Telephone Encounter (Signed)
Needs at least video visit to discuss to differentiate between diabetic neuropathy and RLS, and for treatment.

## 2019-11-29 ENCOUNTER — Telehealth (INDEPENDENT_AMBULATORY_CARE_PROVIDER_SITE_OTHER): Payer: Medicare Other | Admitting: Physician Assistant

## 2019-11-29 ENCOUNTER — Encounter: Payer: Self-pay | Admitting: Physician Assistant

## 2019-11-29 ENCOUNTER — Other Ambulatory Visit: Payer: Self-pay

## 2019-11-29 DIAGNOSIS — G2581 Restless legs syndrome: Secondary | ICD-10-CM | POA: Diagnosis not present

## 2019-11-29 MED ORDER — GABAPENTIN 100 MG PO CAPS: ORAL_CAPSULE | ORAL | 0 refills | Status: DC

## 2019-11-29 NOTE — Progress Notes (Signed)
Virtual Visit via Telephone Note I connected with Tonya Tapia on 11/29/19 at  9:30 AM EDT by telephone and verified that I am speaking with the correct person using two identifiers.  Location: Patient: At hospital with her husband (has a malignant pleural effusion) Provider: [facility]   I discussed the limitations, risks, security and privacy concerns of performing an evaluation and management service by telephone and the availability of in person appointments. I also discussed with the patient that there may be a patient responsible charge related to this service. The patient expressed understanding and agreed to proceed.  History of Present Illness: Patient presents via phone today as she cannot connect to the video portal from her current location.   Patient with history of RLS and mild neuropathy c/o gradually worsening symptoms with significant change in the past few days.  Is noting a achy, tingling pain in the legs with a "creepy crawly" sensation.  Notes she is unable to sit still.  Symptoms are there throughout the day but are much more prevalent at night.  Is affecting her sleep.  Patient does note some increased stressors recently with her husband currently hospitalized for malignant pleural effusion.  Notes the combination of the stressors and current symptoms of increased her anxiety.   Observations/Objective: No labored breathing.  Speech is clear and coherent with logical content.  Patient is alert and oriented at baseline.   Assessment and Plan: 1. RESTLESS LEG SYNDROME Been a milder issue off an on, more significant recently likely impacted by stress levels. Also with some mild diabetic neuropathy. As such will start treatment with Gabapentin 100 mg AM, 100 mg Noon-1 and 300 mg QHS. Once her husband gets out of the hospital she will come by for follow-up and so we can check an iron level   Follow Up Instructions: I discussed the assessment and treatment plan with the  patient. The patient was provided an opportunity to ask questions and all were answered. The patient agreed with the plan and demonstrated an understanding of the instructions.   The patient was advised to call back or seek an in-person evaluation if the symptoms worsen or if the condition fails to improve as anticipated.  I provided 10 minutes of non-face-to-face time during this encounter.   Tonya Rio, PA-C

## 2019-11-29 NOTE — Progress Notes (Signed)
I have discussed the procedure for the virtual visit with the patient who has given consent to proceed with assessment and treatment.   Patina S Moore, CMA     

## 2019-12-02 ENCOUNTER — Other Ambulatory Visit: Payer: Self-pay | Admitting: Physician Assistant

## 2019-12-11 ENCOUNTER — Encounter: Payer: Self-pay | Admitting: Physician Assistant

## 2019-12-11 DIAGNOSIS — Z862 Personal history of diseases of the blood and blood-forming organs and certain disorders involving the immune mechanism: Secondary | ICD-10-CM

## 2019-12-11 DIAGNOSIS — G2581 Restless legs syndrome: Secondary | ICD-10-CM

## 2019-12-11 NOTE — Telephone Encounter (Signed)
Patient called and scheduled a lab appointment for tomorrow - for iron level - I made appt. But I dont see any orders for blood work.

## 2019-12-11 NOTE — Telephone Encounter (Signed)
Pt scheduled for tomorrow

## 2019-12-11 NOTE — Telephone Encounter (Signed)
Orders have been placed.

## 2019-12-12 ENCOUNTER — Ambulatory Visit: Payer: Medicare Other

## 2019-12-16 ENCOUNTER — Encounter: Payer: Self-pay | Admitting: Physician Assistant

## 2019-12-18 ENCOUNTER — Other Ambulatory Visit: Payer: Self-pay | Admitting: Physician Assistant

## 2019-12-18 ENCOUNTER — Other Ambulatory Visit: Payer: Self-pay

## 2019-12-18 ENCOUNTER — Ambulatory Visit (INDEPENDENT_AMBULATORY_CARE_PROVIDER_SITE_OTHER): Payer: Medicare Other

## 2019-12-18 DIAGNOSIS — Z862 Personal history of diseases of the blood and blood-forming organs and certain disorders involving the immune mechanism: Secondary | ICD-10-CM | POA: Diagnosis not present

## 2019-12-18 DIAGNOSIS — G2581 Restless legs syndrome: Secondary | ICD-10-CM

## 2019-12-18 DIAGNOSIS — E119 Type 2 diabetes mellitus without complications: Secondary | ICD-10-CM

## 2019-12-18 LAB — IRON: Iron: 31 ug/dL — ABNORMAL LOW (ref 42–145)

## 2019-12-18 MED ORDER — FREESTYLE LIBRE 14 DAY SENSOR MISC: 1 refills | Status: DC

## 2019-12-18 MED ORDER — FREESTYLE LIBRE 14 DAY READER DEVI: 0 refills | Status: DC

## 2019-12-21 ENCOUNTER — Other Ambulatory Visit: Payer: Self-pay | Admitting: Physician Assistant

## 2019-12-21 ENCOUNTER — Encounter: Payer: Self-pay | Admitting: Physician Assistant

## 2019-12-21 MED ORDER — ROPINIROLE HCL 0.25 MG PO TABS: ORAL_TABLET | ORAL | 0 refills | Status: DC

## 2019-12-22 ENCOUNTER — Other Ambulatory Visit: Payer: Self-pay | Admitting: Physician Assistant

## 2019-12-22 DIAGNOSIS — E785 Hyperlipidemia, unspecified: Secondary | ICD-10-CM

## 2020-01-10 ENCOUNTER — Other Ambulatory Visit: Payer: Self-pay | Admitting: Physician Assistant

## 2020-01-10 NOTE — Progress Notes (Unsigned)
Chronic Care Management Pharmacy  Name: Tonya Tapia  MRN: [id number] DOB: 04-15-1951  Chief Complaint/ HPI Tonya Tapia,  69 y.o. , female presents for their Initial CCM visit with the clinical pharmacist via telephone due to COVID-19 Pandemic.  PCP : Brunetta Jeans, PA-C  Their chronic conditions include: HLD, HTN, DM type II, insomnia, depression/anxiety  Office Visits:  09/26/2019 (PCP): [place] recommended decreasing by 1-2 units if still getting numbers <85.  Advised to schedule lab appointment, due for foot exam also. 07/31/2019 (PCP): Variable blood sugars, revealing lows in 50s, occasional highs in low 300s. Difficulty sleep resulting in nighttime snacking. Started on [place] trial.   Medications: Outpatient Encounter Medications as of 01/11/2020  Medication Sig   ALPRAZolam (XANAX) 0.25 MG tablet Take 1 tablet (0.25 mg total) by mouth every 12 (twelve) hours as needed. for anxiety   atorvastatin (LIPITOR) 20 MG tablet TAKE 1 TABLET BY MOUTH EVERYDAY AT BEDTIME   Blood Glucose Monitoring Suppl ([place]) KIT 1 kit by Does not apply route daily. Dx: E11.9   Calcium Carb-Cholecalciferol (CALCIUM-VITAMIN D) 600-400 MG-UNIT TABS Take 2 each by mouth daily.   Continuous Blood Gluc Receiver (FREESTYLE LIBRE 14 DAY READER) DEVI Use to check glucose TID. E 11.9   Continuous Blood Gluc Sensor (FREESTYLE LIBRE 14 DAY SENSOR) MISC Use with glucometer to check glucose TID. Change sensor Q14d. E11.9   gabapentin (NEURONTIN) 100 MG capsule TAKE 1 CAPSULE BY MOUTH EACH MORNING, 1 IN EARLY AFTERNOON AND 3 IN THE EVENING/NEAR BEDTIME   glucose blood (TRUE METRIX BLOOD GLUCOSE TEST) test strip Check blood sugars daily. Dx: E11.9   Insulin Pen Needle (BD PEN NEEDLE NANO U/F) 32G X 4 MM MISC USE DAILY AS DIRECTED   metFORMIN (GLUCOPHAGE-XR) 500 MG 24 hr tablet TAKE 2 TABLETS BY MOUTH TWICE A DAY WITH MEALS   OVER THE COUNTER MEDICATION Multiple  Vitamins-Minerals-Take 1 tablet by mouth daily.   ramipril (ALTACE) 2.5 MG capsule TAKE 1 CAPSULE(2.5 MG) BY MOUTH DAILY   rOPINIRole (REQUIP) 0.25 MG tablet Take 1 tablet by mouth mid morning and 1 tab in the evening.   sertraline (ZOLOFT) 100 MG tablet TAKE 1.5 TABLETS BY MOUTH DAILY   TRESIBA FLEXTOUCH 100 UNIT/ML FlexTouch Pen INJECT 0.3 MLS (30 UNITS TOTAL) INTO THE SKIN DAILY. TITRATING TO MAX OF 50 UNITS PER DAY   No facility-administered encounter medications on file as of 01/11/2020.   Current Diagnosis/Assessment: Goals Addressed   None    Hypertension  Office blood pressures are  BP Readings from Last 3 Encounters:  07/31/19 140/73  05/01/19 100/70  10/25/18 116/68   Patient home BP readings are ranging: test infrequently. Patient is currently controlled on the following medications: ramipril 2.5 mg daily.  We discussed diet and exercise and monitoring for blood pressure at least once weekly, especially in the context of feeling dizzy on occasion as discussed below. Denies headache, SOB, chest pain.  Plan Continue current medications and monitor BP weekly and when feeling dizzy.     Diabetes   Recent Relevant Labs: Lab Results  Component Value Date/Time   HGBA1C 7.3 (H) 10/03/2019 11:29 AM   HGBA1C 8.1 (H) 03/23/2019 10:11 AM   MICROALBUR 1.3 04/11/2018 10:35 AM   MICROALBUR 2.4 (H) 01/29/2015 09:17 AM   Last diabetic eye exam:  Lab Results  Component Value Date/Time   HMDIABEYEEXA No Retinopathy 07/26/2019 12:00 AM   Patient is currently near goal A1lc<7% on the following medications: metformin  500 mg XR - two tablets twice daily, Tresiba 35 units daily.  A1c has improved from 9.7 to 7.3 since last April. Previously irregular fasting and non-fasting readings reported, including lows 50s-70s and highs occasionally in low 300s. Was checking am fasting blood sugars and evening blood sugars twice daily until receiving lab results on 4/4 and due to good news with  a1c reduction had stopped.   I encouraged twice daily blood sugar readings and patient expressed willingness to resume. Mentions dizziness episode about 2 weeks ago, which may have been related to hypoglycemia. Was encouraged to monitor BP once weekly and both BG and BP at times of dizziness to keep a closer eye on this. Patient agreed to monitoring plan. Will follow-up with phone call in two weeks.  Plan Continue current medications and implement BG and BP monitoring plan.   Hyperlipidemia   Lipid Panel     Component Value Date/Time   CHOL 143 10/25/2018 1025   TRIG 105.0 10/25/2018 1025   HDL 54.40 10/25/2018 1025   CHOLHDL 3 10/25/2018 1025   VLDL 21.0 10/25/2018 1025   LDLCALC 68 10/25/2018 1025   LDLDIRECT 87.0 03/16/2016 1653    The 10-year ASCVD risk score Mikey Bussing DC Jr., et al., 2013) is: 16.7%   Values used to calculate the score:     Age: 78 years     Sex: Female     Is Non-Hispanic African American: No     Diabetic: Yes     Tobacco smoker: No     Systolic Blood Pressure: 250 mmHg     Is BP treated: No     HDL Cholesterol: 54.4 mg/dL     Total Cholesterol: 143 mg/dL   Cholesterol is at goal per 10/2018 lipid panel. Patient is currently controlled on the following medications: atorvastatin 20 mg daily. Denies any muscle or abdominal pain or n/v. No complaints at this time. Reports that she has been walking 40 minutes daily for exercise.  Plan Continue current medications and control with diet and exercise.   Anxiety, depression, insomnia   Depression/anxiety is currently controlled on sertraline  100 mg daily which has been self-reduced from 150 mg daily. Expresses a continued benefit from medication and being satisfied with current dose. In the future would like to consider reducing dose further and tapering off. Advised patient to contact us if further dose reduction is desired.   Taking alprazolam 0.25 mg as needed for anxiety attacks. States alprazolam has not been  used in at least 2-3 weeks weeks.    States that she tried 3-4 nights with suvorexant 10 mg to help with sleep and felt it did not help. Mentions sleep is still inconsistent, ranging from 2 to 6+ hours/night and routine sleep tends to be disrupted by RLS/nerve pain. Wishes to not take anything for sleep at this time. Sleep hygiene encouraged and has been previously discussed with patient.   Plan Continue sertraline 100 mg daily and alprazolam 0.25 mg daily.   Vaccines  Reviewed and discussed patient's vaccination history. Expresses that she is not willing to get Shingles, Tdap vaccine in near future due lack of perceived benefit. Encouraged routine vaccination and we agreed on discussing again at future visit, in at least 6 months.  Immunization History  Administered Date(s) Administered   Fluad Quad(high Dose 65+) 03/23/2019   Influenza Whole 03/19/2010   Influenza, High Dose Seasonal PF 08/05/2016, 04/22/2017, 04/11/2018   PFIZER SARS-COV-2 Vaccination 07/29/2019, 08/20/2019   PPD Test 12/13/2013  Pneumococcal Conjugate-13 03/16/2016   Pneumococcal Polysaccharide-23 04/22/2017   Td 07/06/1998, 01/02/2009   Plan Recommended patient receive Shingles and Tdap vaccine in office/pharmacy if she feels comfortable.   Follow-up:    ______________ Visit Information SDOH (Social Determinants of Health) assessments performed: Yes.  Madelin Rear, Pharm.D. Clinical Pharmacist [facility] [phone]

## 2020-01-11 ENCOUNTER — Telehealth: Payer: Self-pay

## 2020-01-11 ENCOUNTER — Ambulatory Visit: Payer: Medicare Other

## 2020-01-11 NOTE — Telephone Encounter (Signed)
°  Chronic Care Management   Outreach Note   Name: Tonya Tapia MRN: [id number] DOB: 07-19-1950  Referred by: Brunetta Jeans, PA-C  Visit information: Follow-Up Telephone Appointment with [facility] Pharmacist, Madelin Rear.   An unsuccessful telephone outreach was attempted today.   Telephone appointment with clinical pharmacist today (01/11/2020) at 1pm. If patient immediately returns call, transfer to ext [phone]. Otherwise, please provide patient with the number below to reschedule visit.  Madelin Rear, Pharm.D., BCGP Clinical Pharmacist [facility] [phone]

## 2020-01-19 ENCOUNTER — Telehealth: Payer: Self-pay | Admitting: Physician Assistant

## 2020-01-19 NOTE — Telephone Encounter (Signed)
Attempted to schedule AWV. Unable to LVM.  Will try at later time.  

## 2020-02-05 ENCOUNTER — Other Ambulatory Visit: Payer: Self-pay | Admitting: Physician Assistant

## 2020-02-08 ENCOUNTER — Other Ambulatory Visit: Payer: Self-pay | Admitting: Family Medicine

## 2020-02-29 ENCOUNTER — Encounter: Payer: Self-pay | Admitting: Physician Assistant

## 2020-02-29 NOTE — Telephone Encounter (Signed)
Recommend appointment. Could be deconditioning but she may need cardiac workup.

## 2020-03-03 ENCOUNTER — Other Ambulatory Visit: Payer: Self-pay | Admitting: Physician Assistant

## 2020-03-22 ENCOUNTER — Telehealth: Payer: Self-pay | Admitting: Physician Assistant

## 2020-03-22 MED ORDER — GABAPENTIN 100 MG PO CAPS: ORAL_CAPSULE | ORAL | 1 refills | Status: DC

## 2020-03-22 NOTE — Telephone Encounter (Signed)
Rx sent to the preferred patient pharmacy  

## 2020-03-22 NOTE — Telephone Encounter (Signed)
Patient needs her Gabapentin 100 mg. Refilled - Patient states that you had increased the dosage so she will run out of medication and she can not get it filled until the 26th.  She is going out of town on Monday and will not be back till Thursday afternoon -   Please send a new prescription - her present amount will last her until Tuesday- Please send to [place].

## 2020-03-23 ENCOUNTER — Encounter: Payer: Self-pay | Admitting: Physician Assistant

## 2020-03-25 MED ORDER — GABAPENTIN 100 MG PO CAPS: ORAL_CAPSULE | ORAL | 1 refills | Status: DC

## 2020-04-05 ENCOUNTER — Other Ambulatory Visit: Payer: Self-pay

## 2020-04-05 ENCOUNTER — Ambulatory Visit (INDEPENDENT_AMBULATORY_CARE_PROVIDER_SITE_OTHER): Payer: Medicare Other | Admitting: Physician Assistant

## 2020-04-05 ENCOUNTER — Encounter: Payer: Self-pay | Admitting: Physician Assistant

## 2020-04-05 VITALS — BP 128/80 | HR 82 | Temp 98.2°F | Resp 17 | Wt 173.6 lb

## 2020-04-05 DIAGNOSIS — E119 Type 2 diabetes mellitus without complications: Secondary | ICD-10-CM

## 2020-04-05 DIAGNOSIS — Z1211 Encounter for screening for malignant neoplasm of colon: Secondary | ICD-10-CM

## 2020-04-05 DIAGNOSIS — Z23 Encounter for immunization: Secondary | ICD-10-CM

## 2020-04-05 DIAGNOSIS — E1169 Type 2 diabetes mellitus with other specified complication: Secondary | ICD-10-CM

## 2020-04-05 DIAGNOSIS — D1724 Benign lipomatous neoplasm of skin and subcutaneous tissue of left leg: Secondary | ICD-10-CM | POA: Diagnosis not present

## 2020-04-05 DIAGNOSIS — E785 Hyperlipidemia, unspecified: Secondary | ICD-10-CM | POA: Diagnosis not present

## 2020-04-05 LAB — COMPREHENSIVE METABOLIC PANEL
ALT: 21 U/L (ref 0–35)
AST: 19 U/L (ref 0–37)
Albumin: 4.2 g/dL (ref 3.5–5.2)
Alkaline Phosphatase: 82 U/L (ref 39–117)
BUN: 19 mg/dL (ref 6–23)
CO2: 27 mEq/L (ref 19–32)
Calcium: 9.8 mg/dL (ref 8.4–10.5)
Chloride: 102 mEq/L (ref 96–112)
Creatinine, Ser: 0.85 mg/dL (ref 0.40–1.20)
GFR: 66.26 mL/min (ref >60.00)
Glucose, Bld: 308 mg/dL — ABNORMAL HIGH (ref 70–99)
Potassium: 4.7 mEq/L (ref 3.5–5.1)
Sodium: 139 mEq/L (ref 135–145)
Total Bilirubin: 0.3 mg/dL (ref 0.2–1.2)
Total Protein: 6.7 g/dL (ref 6.0–8.3)

## 2020-04-05 LAB — LIPID PANEL
Cholesterol: 136 mg/dL (ref 0–200)
HDL: 66 mg/dL (ref >39.00)
LDL Cholesterol: 53 mg/dL (ref 0–99)
NonHDL: 70.44
Total CHOL/HDL Ratio: 2
Triglycerides: 89 mg/dL (ref 0.0–149.0)
VLDL: 17.8 mg/dL (ref 0.0–40.0)

## 2020-04-05 LAB — HEMOGLOBIN A1C: Hgb A1c MFr Bld: 7.9 % — ABNORMAL HIGH (ref 4.6–6.5)

## 2020-04-05 MED ORDER — OZEMPIC (0.25 OR 0.5 MG/DOSE) 2 MG/1.5ML ~~LOC~~ SOPN: 0.2500 mg | PEN_INJECTOR | SUBCUTANEOUS | 1 refills | Status: DC

## 2020-04-05 NOTE — Progress Notes (Signed)
History of Present Illness: Patient is a 69 y.o. female who presents to clinic today for follow-up of Diabetes Mellitus II, controlled with diabetic neuropathy.  Patient currently on medication regimen of Metformin XR 1000 mg twice daily and Tresiba 30 units daily.  Is also on ramipril 2.5 mg daily for renovascular protection, gabapentin for diabetic neuropathy, and ropinirole for restless leg syndrome.  Endorses taking medications as directed. Endorses continuing to tolerate medications well.  Denies vision changes, polyuria, polydipsia or polyphagia.  Endorses checking blood glucose as directed.  Notes fasting glucose currently ranging from 90-170s depending on what she eats.  Has had a few a.m. episodes of glucose being 70.  As such she did not take her [place] that day.  Latest Maintenance: A1C --  Lab Results  Component Value Date   HGBA1C 7.3 (H) 10/03/2019   Diabetic Eye Exam --up-to-date Urine Microalbumin --on ACE inhibitor  Patient also notes some occasional breakthrough RLS symptoms despite taking her medications as directed.  Is wanting to know what medication doses can be adjusted.    Past Medical History:  Diagnosis Date  . Anxiety   . Diabetes mellitus without complication (HCC)    per pt. borderline diabetic  . Fibrocystic disease of breast   . Mood swings     Current Outpatient Medications on File Prior to Visit  Medication Sig Dispense Refill  . ALPRAZolam (XANAX) 0.25 MG tablet Take 1 tablet (0.25 mg total) by mouth every 12 (twelve) hours as needed. for anxiety 30 tablet 0  . atorvastatin (LIPITOR) 20 MG tablet TAKE 1 TABLET BY MOUTH EVERYDAY AT BEDTIME 90 tablet 1  . Blood Glucose Monitoring Suppl ([place]) KIT 1 kit by Does not apply route daily. Dx: E11.9 1 each 0  . Calcium Carb-Cholecalciferol (CALCIUM-VITAMIN D) 600-400 MG-UNIT TABS Take 2 each by mouth daily.    . Continuous Blood Gluc Receiver (FREESTYLE LIBRE 14 DAY READER) DEVI Use to  check glucose TID. E 11.9 1 each 0  . Continuous Blood Gluc Sensor (FREESTYLE LIBRE 14 DAY SENSOR) MISC Use with glucometer to check glucose TID. Change sensor Q14d. E11.9 6 each 1  . gabapentin (NEURONTIN) 100 MG capsule TAKE 2 CAPSULES BY MOUTH EACH MORNING, 2 IN EARLY AFTERNOON AND 3 IN THE EVENING/NEAR BEDTIME 630 capsule 1  . glucose blood (TRUE METRIX BLOOD GLUCOSE TEST) test strip Check blood sugars daily. Dx: E11.9 300 each 3  . Insulin Pen Needle (BD PEN NEEDLE NANO U/F) 32G X 4 MM MISC USE DAILY AS DIRECTED 100 each 3  . metFORMIN (GLUCOPHAGE-XR) 500 MG 24 hr tablet TAKE 2 TABLETS BY MOUTH TWICE A DAY WITH MEALS 360 tablet 1  . OVER THE COUNTER MEDICATION Multiple Vitamins-Minerals-Take 1 tablet by mouth daily.    . ramipril (ALTACE) 2.5 MG capsule TAKE 1 CAPSULE(2.5 MG) BY MOUTH DAILY 90 capsule 1  . rOPINIRole (REQUIP) 0.25 MG tablet TAKE 1 TABLET BY MOUTH MID MORNING AND 1 TAB IN THE EVENING. 180 tablet 1  . sertraline (ZOLOFT) 100 MG tablet TAKE 1.5 TABLETS BY MOUTH DAILY 135 tablet 1  . TRESIBA FLEXTOUCH 100 UNIT/ML FlexTouch Pen INJECT 0.3 MLS (30 UNITS TOTAL) INTO THE SKIN DAILY. TITRATING TO MAX OF 50 UNITS PER DAY 15 mL 2   No current facility-administered medications on file prior to visit.    No Known Allergies  Family History  Problem Relation Age of Onset  . Stroke Mother   . Heart attack Father   .  Alzheimer's disease Sister        #1-Deceased  . Breast cancer Sister        #1  . Healthy Sister        #2    Social History   Socioeconomic History  . Marital status: Widowed    Spouse name: Not on file  . Number of children: 1  . Years of education: Not on file  . Highest education level: Not on file  Occupational History  . Occupation: Statistician  Tobacco Use  . Smoking status: Never Smoker  . Smokeless tobacco: Never Used  Vaping Use  . Vaping Use: Never used  Substance and Sexual Activity  . Alcohol use: Yes    Alcohol/week: 0.0 standard  drinks    Comment: occas.  . Drug use: No  . Sexual activity: Yes    Partners: Male  Other Topics Concern  . Not on file  Social History Narrative  . Not on file   Social Determinants of Health   Financial Resource Strain:   . Difficulty of Paying Living Expenses: Not on file  Food Insecurity: No Food Insecurity  . Worried About Charity fundraiser in the Last Year: Never true  . Ran Out of Food in the Last Year: Never true  Transportation Needs: No Transportation Needs  . Lack of Transportation (Medical): No  . Lack of Transportation (Non-Medical): No  Physical Activity: Sufficiently Active  . Days of Exercise per Week: 5 days  . Minutes of Exercise per Session: 40 min  Stress:   . Feeling of Stress : Not on file  Social Connections:   . Frequency of Communication with Friends and Family: Not on file  . Frequency of Social Gatherings with Friends and Family: Not on file  . Attends Religious Services: Not on file  . Active Member of Clubs or Organizations: Not on file  . Attends Archivist Meetings: Not on file  . Marital Status: Not on file   Review of Systems: Pertinent ROS are listed in HPI  Physical Examination: BP 128/80   Pulse 82   Temp 98.2 F (36.8 C) (Temporal)   Resp 17   Wt 173 lb 9.6 oz (78.7 kg)   SpO2 98%   BMI 30.75 kg/m  General appearance: alert, cooperative, appears stated age and no distress Head: Normocephalic, without obvious abnormality, atraumatic Lungs: clear to auscultation bilaterally Heart: regular rate and rhythm, S1, S2 normal, no murmur, click, rub or gallop Extremities: extremities normal, atraumatic, no cyanosis or edema Pulses: 2+ and symmetric Neurologic: Alert and oriented X 3, normal strength and tone. Normal symmetric reflexes. Normal coordination and gait   Assessment/Plan: 1. Type 2 diabetes mellitus without complication, without long-term current use of insulin ([place]) Plan to recheck labs today including A1c,  CMP and lipid panel.  Flu vaccine updated today.  Declines other immunizations.  Patient to continue annual diabetic eye examination.  Will proceed with adding once weekly Ozempic 0.25 mg to her current regimen of Metformin and Tresiba.  Titration schedule for Tyler Aas also reviewed.  Close follow-up scheduled to make further adjustments.  Ultimate goal would be able to get her off of insulin as she has no desire to take combination insulin and I do not think we will get the results we need with a very long-acting insulin given her current lifestyle.  Continue checking fasting glucose daily and recording. - Comprehensive metabolic panel - Hemoglobin A1c - Lipid panel  2. Hyperlipidemia associated  with type 2 diabetes mellitus ([place]) Taking medications as directed.  Repeat fasting lipid panel today. - Lipid panel  3. Benign lipomatous neoplasm of skin and subcutaneous tissue of left leg Patient mentions mass of the leg at end of visit is noted over the past several months.  Would like assessed today.  Examination reveals a 3 x 3 cm subcutaneous mass that is mobile and very soft, most consistent with a lipoma.  Reassurance given.  Discussed reasons for removal.  Patient declines intervention at present.  4. Colon cancer screening Patient has previously declined colon cancer screening, specifically colonoscopy.  A further discussion of the need of colon cancer screening was had.  She is agreeable to Cologuard testing.  Order placed. - Cologuard  5. Need for immunization against influenza - Flu Vaccine QUAD High Dose(Fluad)   This visit occurred during the SARS-CoV-2 public health emergency.  Safety protocols were in place, including screening questions prior to the visit, additional usage of staff PPE, and extensive cleaning of exam room while observing appropriate contact time as indicated for disinfecting solutions.

## 2020-04-05 NOTE — Patient Instructions (Signed)
Please go to the lab today for blood work.  I will call you with your results. We will alter treatment regimen(s) if indicated by your results.   Please keep your phone on -- you will be contacted for assessment by physical therapy and for your Cologuard test.   For the leg -- consider letting us get imaging of the area, especially if physical therapy is not helping as we need to further assess.  For glucose -- continue Metformin. Decrease Tresiba by 4 units daily. Start the [place] as directed. If fasting glucose consistently < 130, decrease Tresiba by 4 more units and call me. We will plan on video follow-up in 3-4 weeks. Our goal is to wean down the insulin as we go up on the other medication.

## 2020-04-09 ENCOUNTER — Encounter: Payer: Self-pay | Admitting: Physician Assistant

## 2020-04-17 DIAGNOSIS — Z1211 Encounter for screening for malignant neoplasm of colon: Secondary | ICD-10-CM | POA: Diagnosis not present

## 2020-04-17 DIAGNOSIS — Z1212 Encounter for screening for malignant neoplasm of rectum: Secondary | ICD-10-CM | POA: Diagnosis not present

## 2020-04-17 LAB — COLOGUARD: Cologuard: NEGATIVE

## 2020-04-26 ENCOUNTER — Encounter: Payer: Self-pay | Admitting: Physician Assistant

## 2020-04-29 ENCOUNTER — Encounter: Payer: Self-pay | Admitting: Emergency Medicine

## 2020-05-06 ENCOUNTER — Encounter: Payer: Self-pay | Admitting: Physician Assistant

## 2020-05-06 ENCOUNTER — Telehealth (INDEPENDENT_AMBULATORY_CARE_PROVIDER_SITE_OTHER): Payer: Medicare Other | Admitting: Physician Assistant

## 2020-05-06 ENCOUNTER — Other Ambulatory Visit: Payer: Self-pay

## 2020-05-06 DIAGNOSIS — E119 Type 2 diabetes mellitus without complications: Secondary | ICD-10-CM

## 2020-05-06 MED ORDER — ALPRAZOLAM 0.25 MG PO TABS: 0.2500 mg | ORAL_TABLET | Freq: Two times a day (BID) | ORAL | 0 refills | Status: AC | PRN

## 2020-05-06 NOTE — Patient Instructions (Signed)
Instructions sent to MyChart

## 2020-05-06 NOTE — Progress Notes (Signed)
Virtual Visit via Video   I connected with patient on 05/06/20 at 10:00 AM EDT by a video enabled telemedicine application and verified that I am speaking with the correct person using two identifiers.  Location patient: Home Location provider: [facility], Office Persons participating in the virtual visit: Patient, Provider, [place] (Patina Moore)  I discussed the limitations of evaluation and management by telemedicine and the availability of in person appointments. The patient expressed understanding and agreed to proceed.  Subjective:   HPI:   Patient presents via [facility] today for follow-up of blood glucose levels after adjustment in her diabetic regimen last month. Last visit patient was started on Ozempic 25 mg weekly. At the same time was given a titration schedule for her insulin. The ultimate goal being to wean her off of her insulin entirely to avoid associated weight gain, etc. Patient endorses taking the Ozempic as directed and tolerating well. Is scheduled to start with 0.5 mg weekly dose tomorrow. In regards to her Tyler Aas however, patient states she did not understand her titration schedule and as such has completely stopped her Tyler Aas as of 04/16/2020. Notes fasting glucose averaging 140s to 190s. Denies fasting glucose greater than 200. Denies change in vision. Notes diet has been much more consistent. Does stay active but no regular exercise regimen.  ROS:   See pertinent positives and negatives per HPI.  Patient Active Problem List   Diagnosis Date Noted  . Hyperlipidemia associated with type 2 diabetes mellitus ([place]) 10/11/2017  . Hypomagnesemia   . S/P cholecystectomy   . Choledocholithiasis with chronic cholecystitis   . Abnormal LFTs 04/02/2017  . Osteopenia 07/16/2016  . Vitamin D deficiency 07/16/2016  . Postmenopausal estrogen deficiency 06/15/2016  . Type 2 diabetes mellitus without complication, without long-term current use of insulin ([place])  01/29/2015  . Anxiety and depression 01/29/2015  . Breast cancer screening 01/29/2015  . RESTLESS LEG SYNDROME 07/04/2007  . MIGRAINE, MENSTRUAL 07/04/2007  . PSORIASIS 07/04/2007  . History of partial thyroidectomy 07/04/2007    Social History   Tobacco Use  . Smoking status: Never Smoker  . Smokeless tobacco: Never Used  Substance Use Topics  . Alcohol use: Yes    Alcohol/week: 0.0 standard drinks    Comment: occas.    Current Outpatient Medications:  .  ALPRAZolam (XANAX) 0.25 MG tablet, Take 1 tablet (0.25 mg total) by mouth every 12 (twelve) hours as needed. for anxiety, Disp: 30 tablet, Rfl: 0 .  atorvastatin (LIPITOR) 20 MG tablet, TAKE 1 TABLET BY MOUTH EVERYDAY AT BEDTIME, Disp: 90 tablet, Rfl: 1 .  Blood Glucose Monitoring Suppl ([place]) KIT, 1 kit by Does not apply route daily. Dx: E11.9, Disp: 1 each, Rfl: 0 .  Calcium Carb-Cholecalciferol (CALCIUM-VITAMIN D) 600-400 MG-UNIT TABS, Take 2 each by mouth daily., Disp: , Rfl:  .  Continuous Blood Gluc Receiver (FREESTYLE LIBRE 14 DAY READER) DEVI, Use to check glucose TID. E 11.9, Disp: 1 each, Rfl: 0 .  Continuous Blood Gluc Sensor (FREESTYLE LIBRE 14 DAY SENSOR) MISC, Use with glucometer to check glucose TID. Change sensor Q14d. E11.9, Disp: 6 each, Rfl: 1 .  gabapentin (NEURONTIN) 100 MG capsule, TAKE 2 CAPSULES BY MOUTH EACH MORNING, 2 IN EARLY AFTERNOON AND 3 IN THE EVENING/NEAR BEDTIME, Disp: 630 capsule, Rfl: 1 .  glucose blood (TRUE METRIX BLOOD GLUCOSE TEST) test strip, Check blood sugars daily. Dx: E11.9, Disp: 300 each, Rfl: 3 .  Insulin Pen Needle (BD PEN NEEDLE NANO U/F)  32G X 4 MM MISC, USE DAILY AS DIRECTED, Disp: 100 each, Rfl: 3 .  metFORMIN (GLUCOPHAGE-XR) 500 MG 24 hr tablet, TAKE 2 TABLETS BY MOUTH TWICE A DAY WITH MEALS, Disp: 360 tablet, Rfl: 1 .  OVER THE COUNTER MEDICATION, Multiple Vitamins-Minerals-Take 1 tablet by mouth daily., Disp: , Rfl:  .  ramipril (ALTACE) 2.5 MG capsule, TAKE 1  CAPSULE(2.5 MG) BY MOUTH DAILY, Disp: 90 capsule, Rfl: 1 .  rOPINIRole (REQUIP) 0.25 MG tablet, TAKE 1 TABLET BY MOUTH MID MORNING AND 1 TAB IN THE EVENING., Disp: 180 tablet, Rfl: 1 .  Semaglutide,0.25 or 0.5MG /DOS, (OZEMPIC, 0.25 OR 0.5 MG/DOSE,) 2 MG/1.5ML SOPN, Inject 0.25 mg into the skin once a week. For 4 weeks. Then increase to 0.5 mg once weekly., Disp: 1.5 mL, Rfl: 1 .  sertraline (ZOLOFT) 100 MG tablet, TAKE 1.5 TABLETS BY MOUTH DAILY, Disp: 135 tablet, Rfl: 1 .  TRESIBA FLEXTOUCH 100 UNIT/ML FlexTouch Pen, INJECT 0.3 MLS (30 UNITS TOTAL) INTO THE SKIN DAILY. TITRATING TO MAX OF 50 UNITS PER DAY (Patient not taking: Reported on 05/06/2020), Disp: 15 mL, Rfl: 2  No Known Allergies  Objective:   There were no vitals taken for this visit.  Patient is well-developed, well-nourished in no acute distress.  Resting comfortably at home.  Head is normocephalic, atraumatic.  No labored breathing.  Speech is clear and coherent with logical content.  Patient is alert and oriented at baseline.   Assessment and Plan:   1. Type 2 diabetes mellitus without complication, without long-term current use of insulin (HCC) Patient to go ahead and increase Ozempic 2.5 mg weekly dose x2 weeks. Then will increase further to 1 mg weekly. Patient's Tyler Aas restarted as she was not to completely discontinue this medication. Restarted at 10 units. Continue consistency with diet and activity level. She is to call us in 1 week with her glucose readings so we can adjust insulin further while we are titrating up on her weekly GLP-1 receptor agonist. Plan on an office follow-up in 2 months to repeat A1c as well as other lab work.    Leeanne Rio, PA-C 05/06/2020 9

## 2020-05-06 NOTE — Progress Notes (Signed)
I have discussed the procedure for the virtual visit with the patient who has given consent to proceed with assessment and treatment.   BS in the morning lowest reading 151 and the highest was 264 Tresiba on October 12th  Pt suppose to increase new medication tomorrow to 0.5  Davis Gourd, CMA

## 2020-05-08 ENCOUNTER — Telehealth: Payer: Self-pay

## 2020-05-08 NOTE — Progress Notes (Unsigned)
Chronic Care Management Pharmacy Assistant   Name: Tonya Tapia  MRN: [id number] DOB: [birth year]  Reason for Encounter: Disease State   PCP : Brunetta Jeans, PA-C  Allergies:  No Known Allergies  Medications: Outpatient Encounter Medications as of 05/08/2020  Medication Sig  . ALPRAZolam (XANAX) 0.25 MG tablet Take 1 tablet (0.25 mg total) by mouth every 12 (twelve) hours as needed. for anxiety  . atorvastatin (LIPITOR) 20 MG tablet TAKE 1 TABLET BY MOUTH EVERYDAY AT BEDTIME  . Blood Glucose Monitoring Suppl ([place]) KIT 1 kit by Does not apply route daily. Dx: E11.9  . Calcium Carb-Cholecalciferol (CALCIUM-VITAMIN D) 600-400 MG-UNIT TABS Take 2 each by mouth daily.  . Continuous Blood Gluc Receiver (FREESTYLE LIBRE 14 DAY READER) DEVI Use to check glucose TID. E 11.9  . Continuous Blood Gluc Sensor (FREESTYLE LIBRE 14 DAY SENSOR) MISC Use with glucometer to check glucose TID. Change sensor Q14d. E11.9  . gabapentin (NEURONTIN) 100 MG capsule TAKE 2 CAPSULES BY MOUTH EACH MORNING, 2 IN EARLY AFTERNOON AND 3 IN THE EVENING/NEAR BEDTIME  . glucose blood (TRUE METRIX BLOOD GLUCOSE TEST) test strip Check blood sugars daily. Dx: E11.9  . Insulin Pen Needle (BD PEN NEEDLE NANO U/F) 32G X 4 MM MISC USE DAILY AS DIRECTED  . metFORMIN (GLUCOPHAGE-XR) 500 MG 24 hr tablet TAKE 2 TABLETS BY MOUTH TWICE A DAY WITH MEALS  . OVER THE COUNTER MEDICATION Multiple Vitamins-Minerals-Take 1 tablet by mouth daily.  . ramipril (ALTACE) 2.5 MG capsule TAKE 1 CAPSULE(2.5 MG) BY MOUTH DAILY  . rOPINIRole (REQUIP) 0.25 MG tablet TAKE 1 TABLET BY MOUTH MID MORNING AND 1 TAB IN THE EVENING.  . Semaglutide,0.25 or 0.5MG/DOS, (OZEMPIC, 0.25 OR 0.5 MG/DOSE,) 2 MG/1.5ML SOPN Inject 0.25 mg into the skin once a week. For 4 weeks. Then increase to 0.5 mg once weekly.  . sertraline (ZOLOFT) 100 MG tablet TAKE 1.5 TABLETS BY MOUTH DAILY  . TRESIBA FLEXTOUCH 100 UNIT/ML FlexTouch Pen INJECT 0.3  MLS (30 UNITS TOTAL) INTO THE SKIN DAILY. TITRATING TO MAX OF 50 UNITS PER DAY (Patient not taking: Reported on 05/06/2020)   No facility-administered encounter medications on file as of 05/08/2020.    Current Diagnosis: Patient Active Problem List   Diagnosis Date Noted  . Hyperlipidemia associated with type 2 diabetes mellitus ([place]) 10/11/2017  . Hypomagnesemia   . S/P cholecystectomy   . Choledocholithiasis with chronic cholecystitis   . Abnormal LFTs 04/02/2017  . Osteopenia 07/16/2016  . Vitamin D deficiency 07/16/2016  . Postmenopausal estrogen deficiency 06/15/2016  . Type 2 diabetes mellitus without complication, without long-term current use of insulin ([place]) 01/29/2015  . Anxiety and depression 01/29/2015  . Breast cancer screening 01/29/2015  . RESTLESS LEG SYNDROME 07/04/2007  . MIGRAINE, MENSTRUAL 07/04/2007  . PSORIASIS 07/04/2007  . History of partial thyroidectomy 07/04/2007    Recent Relevant Labs: Lab Results  Component Value Date/Time   HGBA1C 7.9 (H) 04/05/2020 10:41 AM   HGBA1C 7.3 (H) 10/03/2019 11:29 AM   MICROALBUR 1.3 04/11/2018 10:35 AM   MICROALBUR 2.4 (H) 01/29/2015 09:17 AM    Kidney Function Lab Results  Component Value Date/Time   CREATININE 0.85 04/05/2020 10:41 AM   CREATININE 0.75 10/03/2019 11:29 AM   GFR 66.26 04/05/2020 10:41 AM   GFRNONAA >60 04/09/2017 03:42 AM   GFRAA >60 04/09/2017 03:42 AM    . Current antihyperglycemic regimen:  metFORMIN (GLUCOPHAGE-XR) 500 MG 24 hr tablet Semaglutide,0.25 or 0.5MG/DOS, (OZEMPIC, 0.25  OR 0.5 MG/DOSE,) 2 MG/1.5ML Solution Pen-injector . What recent interventions/DTPs have been made to improve glycemic control:  05-06-2020 video visit Raiford Noble, [place]) - Patient to go ahead and increase Ozempic 2.5 mg weekly dose x2 weeks. Then will increase further to 1 mg weekly. Patient's Tyler Aas restarted as she was not to completely discontinue this medication. Restarted at 10 units. Continue consistency  with diet and activity level. She is to call us in 1 week with her glucose readings so we can adjust insulin further while we are titrating up on her weekly GLP-1 receptor agonist. . Have there been any recent hospitalizations or ED visits since last visit with CPP? No . Patient {reports/denies:24182} hypoglycemic symptoms, including {Hypoglycemic Symptoms:3049003} . Patient {reports/denies:24182} hyperglycemic symptoms, including {symptoms; hyperglycemia:17903} . How often are you checking your blood sugar? {BG Testing frequency:23922} . What are your blood sugars ranging?  o Fasting: *** o Before meals: *** o After meals: *** o Bedtime: *** . During the week, how often does your blood glucose drop below 70? {LowBGfrequency:24142} . Are you checking your feet daily/regularly?   Adherence Review: Is the patient currently on a STATIN medication? {yes/no:20286} Is the patient currently on ACE/ARB medication? {yes/no:20286} Does the patient have >5 day gap between last estimated fill dates? {yes/no:20286}   Derrek Monaco     Follow-Up:  Pharmacist Review

## 2020-05-13 ENCOUNTER — Encounter: Payer: Self-pay | Admitting: Physician Assistant

## 2020-05-13 NOTE — Telephone Encounter (Signed)
She needs appointment. If noting palpitations, SOB, lightheadedness or chest pain -- needs ER evaluation.

## 2020-05-14 ENCOUNTER — Encounter: Payer: Self-pay | Admitting: Physician Assistant

## 2020-05-21 ENCOUNTER — Encounter: Payer: Self-pay | Admitting: Physician Assistant

## 2020-05-21 DIAGNOSIS — E119 Type 2 diabetes mellitus without complications: Secondary | ICD-10-CM

## 2020-05-23 MED ORDER — OZEMPIC (1 MG/DOSE) 2 MG/1.5ML ~~LOC~~ SOPN: 1.0000 mg | PEN_INJECTOR | SUBCUTANEOUS | 1 refills | Status: DC

## 2020-05-26 ENCOUNTER — Other Ambulatory Visit: Payer: Self-pay | Admitting: Physician Assistant

## 2020-05-26 DIAGNOSIS — E1169 Type 2 diabetes mellitus with other specified complication: Secondary | ICD-10-CM

## 2020-05-26 DIAGNOSIS — E785 Hyperlipidemia, unspecified: Secondary | ICD-10-CM

## 2020-05-28 ENCOUNTER — Encounter: Payer: Self-pay | Admitting: Physician Assistant

## 2020-05-28 ENCOUNTER — Telehealth: Payer: Self-pay

## 2020-05-28 NOTE — Progress Notes (Unsigned)
Chronic Care Management Pharmacy Assistant   Name: Tonya Tapia  MRN: [id number] DOB: 04/12/1951  Reason for Encounter: Disease State  PCP : Brunetta Jeans, PA-C  Allergies:  No Known Allergies  Medications: Outpatient Encounter Medications as of 05/28/2020  Medication Sig  . ALPRAZolam (XANAX) 0.25 MG tablet Take 1 tablet (0.25 mg total) by mouth every 12 (twelve) hours as needed. for anxiety  . atorvastatin (LIPITOR) 20 MG tablet TAKE 1 TABLET BY MOUTH EVERYDAY AT BEDTIME  . Blood Glucose Monitoring Suppl ([place]) KIT 1 kit by Does not apply route daily. Dx: E11.9  . Calcium Carb-Cholecalciferol (CALCIUM-VITAMIN D) 600-400 MG-UNIT TABS Take 2 each by mouth daily.  . Continuous Blood Gluc Receiver (FREESTYLE LIBRE 14 DAY READER) DEVI Use to check glucose TID. E 11.9  . Continuous Blood Gluc Sensor (FREESTYLE LIBRE 14 DAY SENSOR) MISC Use with glucometer to check glucose TID. Change sensor Q14d. E11.9  . gabapentin (NEURONTIN) 100 MG capsule TAKE 2 CAPSULES BY MOUTH EACH MORNING, 2 IN EARLY AFTERNOON AND 3 IN THE EVENING/NEAR BEDTIME  . glucose blood (TRUE METRIX BLOOD GLUCOSE TEST) test strip Check blood sugars daily. Dx: E11.9  . Insulin Pen Needle (BD PEN NEEDLE NANO U/F) 32G X 4 MM MISC USE DAILY AS DIRECTED  . metFORMIN (GLUCOPHAGE-XR) 500 MG 24 hr tablet TAKE 2 TABLETS BY MOUTH TWICE A DAY WITH MEALS  . OVER THE COUNTER MEDICATION Multiple Vitamins-Minerals-Take 1 tablet by mouth daily.  . ramipril (ALTACE) 2.5 MG capsule TAKE 1 CAPSULE(2.5 MG) BY MOUTH DAILY  . rOPINIRole (REQUIP) 0.25 MG tablet TAKE 1 TABLET BY MOUTH MID MORNING AND 1 TAB IN THE EVENING.  . Semaglutide, 1 MG/DOSE, (OZEMPIC, 1 MG/DOSE,) 2 MG/1.5ML SOPN Inject 1 mg into the skin once a week.  . sertraline (ZOLOFT) 100 MG tablet TAKE 1.5 TABLETS BY MOUTH DAILY  . TRESIBA FLEXTOUCH 100 UNIT/ML FlexTouch Pen INJECT 0.3 MLS (30 UNITS TOTAL) INTO THE SKIN DAILY. TITRATING TO MAX OF 50 UNITS  PER DAY (Patient not taking: Reported on 05/06/2020)   No facility-administered encounter medications on file as of 05/28/2020.    Current Diagnosis: Patient Active Problem List   Diagnosis Date Noted  . Hyperlipidemia associated with type 2 diabetes mellitus ([place]) 10/11/2017  . Hypomagnesemia   . S/P cholecystectomy   . Choledocholithiasis with chronic cholecystitis   . Abnormal LFTs 04/02/2017  . Osteopenia 07/16/2016  . Vitamin D deficiency 07/16/2016  . Postmenopausal estrogen deficiency 06/15/2016  . Type 2 diabetes mellitus without complication, without long-term current use of insulin ([place]) 01/29/2015  . Anxiety and depression 01/29/2015  . Breast cancer screening 01/29/2015  . RESTLESS LEG SYNDROME 07/04/2007  . MIGRAINE, MENSTRUAL 07/04/2007  . PSORIASIS 07/04/2007  . History of partial thyroidectomy 07/04/2007    Recent Relevant Labs: Lab Results  Component Value Date/Time   HGBA1C 7.9 (H) 04/05/2020 10:41 AM   HGBA1C 7.3 (H) 10/03/2019 11:29 AM   MICROALBUR 1.3 04/11/2018 10:35 AM   MICROALBUR 2.4 (H) 01/29/2015 09:17 AM    Kidney Function Lab Results  Component Value Date/Time   CREATININE 0.85 04/05/2020 10:41 AM   CREATININE 0.75 10/03/2019 11:29 AM   GFR 66.26 04/05/2020 10:41 AM   GFRNONAA >60 04/09/2017 03:42 AM   GFRAA >60 04/09/2017 03:42 AM    . Current antihyperglycemic regimen:  atorvastatin (LIPITOR) 20 MG tablet metFORMIN (GLUCOPHAGE-XR) 500 MG 24 hr tablet . What recent interventions/DTPs have been made to improve glycemic control:  05-14-20  Patient message Raiford Noble, [place]) : Patient messaged provider her sugar levels and provider advised patient "Once you go up to the 1 mg dose of the ozempic, I want you to decrease your insulin by 3 units. Keep a check on the glucose levels. Our fasting goal is 80-110." o  . Have there been any recent hospitalizations or ED visits since last visit with CPP? No . Patient {reports/denies:24182}  hypoglycemic symptoms, including {Hypoglycemic Symptoms:3049003} . Patient {reports/denies:24182} hyperglycemic symptoms, including {symptoms; hyperglycemia:17903} . How often are you checking your blood sugar? {BG Testing frequency:23922} . What are your blood sugars ranging?  o Fasting: *** o Before meals: *** o After meals: *** o Bedtime: *** . During the week, how often does your blood glucose drop below 70? {LowBGfrequency:24142} . Are you checking your feet daily/regularly?   Adherence Review: Is the patient currently on a STATIN medication? Yes Is the patient currently on ACE/ARB medication? Yes Does the patient have >5 day gap between last estimated fill dates? CPP to review   [place] ,[place] Pharmacist Assistant [phone]   Follow-Up:  Pharmacist Review

## 2020-06-07 ENCOUNTER — Telehealth: Payer: Self-pay

## 2020-06-07 NOTE — Progress Notes (Cosign Needed)
error 

## 2020-06-08 ENCOUNTER — Encounter: Payer: Self-pay | Admitting: Physician Assistant

## 2020-06-18 ENCOUNTER — Other Ambulatory Visit: Payer: Self-pay | Admitting: Physician Assistant

## 2020-06-26 ENCOUNTER — Encounter: Payer: Self-pay | Admitting: Physician Assistant

## 2020-07-01 ENCOUNTER — Other Ambulatory Visit: Payer: Self-pay | Admitting: Physician Assistant

## 2020-07-01 DIAGNOSIS — F419 Anxiety disorder, unspecified: Secondary | ICD-10-CM

## 2020-07-13 ENCOUNTER — Encounter: Payer: Self-pay | Admitting: Physician Assistant

## 2020-07-23 ENCOUNTER — Telehealth: Payer: Self-pay

## 2020-07-23 NOTE — Chronic Care Management (AMB) (Signed)
Chronic Care Management Pharmacy Assistant   Name: ENDORA TERESI  MRN: [id number] DOB: 02-20-1951  Reason for Encounter: Disease State /Diabetes Adherence Call  PCP : Brunetta Jeans, PA-C  Allergies:  No Known Allergies  Medications: Outpatient Encounter Medications as of 07/23/2020  Medication Sig  . ALPRAZolam (XANAX) 0.25 MG tablet Take 1 tablet (0.25 mg total) by mouth every 12 (twelve) hours as needed. for anxiety  . atorvastatin (LIPITOR) 20 MG tablet TAKE 1 TABLET BY MOUTH EVERYDAY AT BEDTIME  . Blood Glucose Monitoring Suppl ([place]) KIT 1 kit by Does not apply route daily. Dx: E11.9  . Calcium Carb-Cholecalciferol (CALCIUM-VITAMIN D) 600-400 MG-UNIT TABS Take 2 each by mouth daily.  . Continuous Blood Gluc Receiver (FREESTYLE LIBRE 14 DAY READER) DEVI Use to check glucose TID. E 11.9  . Continuous Blood Gluc Sensor (FREESTYLE LIBRE 14 DAY SENSOR) MISC Use with glucometer to check glucose TID. Change sensor Q14d. E11.9  . gabapentin (NEURONTIN) 100 MG capsule TAKE 2 CAPSULES BY MOUTH EACH MORNING, 2 IN EARLY AFTERNOON AND 3 IN THE EVENING/NEAR BEDTIME  . glucose blood (TRUE METRIX BLOOD GLUCOSE TEST) test strip Check blood sugars daily. Dx: E11.9  . Insulin Pen Needle (BD PEN NEEDLE NANO U/F) 32G X 4 MM MISC USE DAILY AS DIRECTED  . metFORMIN (GLUCOPHAGE-XR) 500 MG 24 hr tablet TAKE 2 TABLETS BY MOUTH TWICE A DAY WITH MEALS  . OVER THE COUNTER MEDICATION Multiple Vitamins-Minerals-Take 1 tablet by mouth daily.  . ramipril (ALTACE) 2.5 MG capsule TAKE 1 CAPSULE(2.5 MG) BY MOUTH DAILY  . rOPINIRole (REQUIP) 0.25 MG tablet TAKE 1 TABLET BY MOUTH MID MORNING AND 1 TAB IN THE EVENING.  . Semaglutide, 1 MG/DOSE, (OZEMPIC, 1 MG/DOSE,) 2 MG/1.5ML SOPN Inject 1 mg into the skin once a week.  . sertraline (ZOLOFT) 100 MG tablet TAKE 1.5 TABLETS BY MOUTH DAILY  . TRESIBA FLEXTOUCH 100 UNIT/ML FlexTouch Pen INJECT 0.3 MLS (30 UNITS TOTAL) INTO THE SKIN DAILY.  TITRATING TO MAX OF 50 UNITS PER DAY (Patient not taking: Reported on 05/06/2020)   No facility-administered encounter medications on file as of 07/23/2020.    Current Diagnosis: Patient Active Problem List   Diagnosis Date Noted  . Hyperlipidemia associated with type 2 diabetes mellitus ([place]) 10/11/2017  . Hypomagnesemia   . S/P cholecystectomy   . Choledocholithiasis with chronic cholecystitis   . Abnormal LFTs 04/02/2017  . Osteopenia 07/16/2016  . Vitamin D deficiency 07/16/2016  . Postmenopausal estrogen deficiency 06/15/2016  . Type 2 diabetes mellitus without complication, without long-term current use of insulin ([place]) 01/29/2015  . Anxiety and depression 01/29/2015  . Breast cancer screening 01/29/2015  . RESTLESS LEG SYNDROME 07/04/2007  . MIGRAINE, MENSTRUAL 07/04/2007  . PSORIASIS 07/04/2007  . History of partial thyroidectomy 07/04/2007    Recent Relevant Labs: Lab Results  Component Value Date/Time   HGBA1C 7.9 (H) 04/05/2020 10:41 AM   HGBA1C 7.3 (H) 10/03/2019 11:29 AM   MICROALBUR 1.3 04/11/2018 10:35 AM   MICROALBUR 2.4 (H) 01/29/2015 09:17 AM    Kidney Function Lab Results  Component Value Date/Time   CREATININE 0.85 04/05/2020 10:41 AM   CREATININE 0.75 10/03/2019 11:29 AM   GFR 66.26 04/05/2020 10:41 AM   GFRNONAA >60 04/09/2017 03:42 AM   GFRAA >60 04/09/2017 03:42 AM    . Current antihyperglycemic regimen:  o Metformin 500 mg 24 hr tablet, two tablets twice daily with meals. o Ozempic 1 mg/dose, inject 1 mg into the  skin once a week. o Tresiba Flextouch 100u/mL, injection 0.34mls into the skin daily. Titrating to the max of 50 units daily.  . What recent interventions/DTPs have been made to improve glycemic control:   Patient did not have any office visits, the following are patient messages from [place], PA-C:  05/06/2020 - Please restart the Tresiba 10 units daily. Increase the Ozempic 2.5 mg weekly tomorrow as scheduled  05/14/2020 -  Once you go up to the 1 mg dose of the ozempic, I want you to decrease your insulin by 3 units. Keep a check on the glucose levels. Our fasting goal is 80-110.  05/28/2020 - Keep on current dose of Ozempic for now. Also increase Tresiba to 9 units for now and monitor. Want glucose to get to 90-100 fasting each day. Will take a few weeks for the new dose of Ozempic to make further changes. Would want to keep you on this dose for a few months to see what it is truly going today. Again our goal if possible is to wean the insulin over time.   06/08/2020 - If glucose (fasting) < 90 decrease your insulin by 4 units.   . Have there been any recent hospitalizations or ED visits since last visit with CPP? No , patient has not had any recent hospitalizations or ED since her last visit with Madelin Rear, Pharm.D., [facility].  Marland Kitchen Patient denies hypoglycemic symptoms, including Pale, Sweaty, Shaky, Hungry, Nervous/irritable and Vision changes   . Patient denies hyperglycemic symptoms, including blurry vision, excessive thirst, fatigue, polyuria and weakness   . Patient states when she stands up for about 5-10 seconds she will feel lightheaded and dizzy and will have to remain still until it goes away. Patient states she has been experiencing these symptoms for about a week now.  . How often are you checking your blood sugar? once daily before breakfast.  . What are your blood sugars ranging?  o Fasting: between dates 07/14/2020 - 07/22/2020 lowest 101, highest 141(patient states she ate after midnight the night before when she had her highest reading.) o Before meals: n/a o After meals: n/a o Bedtime: n/a  . During the week, how often does your blood glucose drop below 70? Never   . Are you checking your feet daily/regularly?   Patient states she doesn't really check her feet recently. Patient states she knows what warning signs to look out for stating she has "good feeling in her feet and they are not changing  colors".  Adherence Review: Is the patient currently on a STATIN medication? Yes Is the patient currently on ACE/ARB medication? Yes Does the patient have >5 day gap between last estimated fill dates? No   April D Calhoun, [place] Pharmacist Assistant [phone]   Follow-Up:  Pharmacist Review

## 2020-07-26 DIAGNOSIS — E119 Type 2 diabetes mellitus without complications: Secondary | ICD-10-CM | POA: Diagnosis not present

## 2020-07-26 LAB — HM DIABETES EYE EXAM

## 2020-07-29 ENCOUNTER — Encounter: Payer: Self-pay | Admitting: Physician Assistant

## 2020-07-30 ENCOUNTER — Other Ambulatory Visit: Payer: Self-pay | Admitting: Physician Assistant

## 2020-07-30 DIAGNOSIS — Z794 Long term (current) use of insulin: Secondary | ICD-10-CM

## 2020-07-30 DIAGNOSIS — F32A Depression, unspecified: Secondary | ICD-10-CM

## 2020-07-30 DIAGNOSIS — E119 Type 2 diabetes mellitus without complications: Secondary | ICD-10-CM

## 2020-09-01 ENCOUNTER — Encounter: Payer: Self-pay | Admitting: Physician Assistant

## 2020-09-03 ENCOUNTER — Telehealth: Payer: Self-pay | Admitting: Physician Assistant

## 2020-09-03 NOTE — Telephone Encounter (Signed)
Patient needs sertaline 100 mg sent in. They have changed to this pharmacy.

## 2020-09-04 ENCOUNTER — Other Ambulatory Visit: Payer: Self-pay | Admitting: Emergency Medicine

## 2020-09-04 DIAGNOSIS — F419 Anxiety disorder, unspecified: Secondary | ICD-10-CM

## 2020-09-04 DIAGNOSIS — F32A Depression, unspecified: Secondary | ICD-10-CM

## 2020-09-04 MED ORDER — SERTRALINE HCL 100 MG PO TABS: 150.0000 mg | ORAL_TABLET | Freq: Every day | ORAL | 0 refills | Status: DC

## 2020-09-04 NOTE — Telephone Encounter (Signed)
Rx sent to the preferred patient pharmacy  

## 2020-09-05 ENCOUNTER — Telehealth: Payer: Self-pay

## 2020-09-05 NOTE — Telephone Encounter (Signed)
Prior to changing or prescribing anything, I feel that she needs to be seen.  Is she actually having diabetic neuropathy or true RLS?  It seems odd that the RLS would bother her during the day and that she needs this medicine 2 (or 3) times each day.  I just want to make sure that we're doing what's best for her and not placing her at increased risk of falls/sedation/etc

## 2020-09-05 NOTE — Telephone Encounter (Signed)
Hi Dr Birdie Riddle,  Patient is requesting dose increase on RLS medications- please see current regimen/possible considerations below. Previously managed by Einar Pheasant, you will see her for DM f/u end of month.  Current regimen for RLS:  Gabapentin 100 mg caps AM/Afternoon/Evening 200/200/300 mg (700 mg/day)  Ropinirole 0.25 mg BID   If you are on board, could have patient patient start with changing/increasing gabapentin to 3 caps (300 mg) midday and 6 caps (600 mg) in evening (900 mg/day). Could also consider increasing ropinirole 0.25 mg frequency from BID to TID.  Please let me know your thoughts- will communicate any changes (or no changes) to patient and encourage her to officially get PCP TOC scheduled.   Thanks! Edison Nasuti

## 2020-09-05 NOTE — Chronic Care Management (AMB) (Signed)
Chronic Care Management Pharmacy Assistant   Name: Tonya Tapia  MRN: [id number] DOB: March 14, 1951  Reason for Encounter: Disease State/ Diabetes Adherence Call  PCP : Brunetta Jeans, PA-C  Allergies:  No Known Allergies  Medications: Outpatient Encounter Medications as of 09/05/2020  Medication Sig   ALPRAZolam (XANAX) 0.25 MG tablet Take 1 tablet (0.25 mg total) by mouth every 12 (twelve) hours as needed. for anxiety   atorvastatin (LIPITOR) 20 MG tablet TAKE 1 TABLET BY MOUTH EVERYDAY AT BEDTIME   BD PEN NEEDLE NANO 2ND GEN 32G X 4 MM MISC USE DAILY AS DIRECTED   Blood Glucose Monitoring Suppl ([employer] SMART SYSTEM) KIT 1 kit by Does not apply route daily. Dx: E11.9   Calcium Carb-Cholecalciferol (CALCIUM-VITAMIN D) 600-400 MG-UNIT TABS Take 2 each by mouth daily.   Continuous Blood Gluc Receiver (FREESTYLE LIBRE 14 DAY READER) DEVI Use to check glucose TID. E 11.9   Continuous Blood Gluc Sensor (FREESTYLE LIBRE 14 DAY SENSOR) MISC Use with glucometer to check glucose TID. Change sensor Q14d. E11.9   gabapentin (NEURONTIN) 100 MG capsule TAKE 2 CAPSULES BY MOUTH EACH MORNING, 2 IN EARLY AFTERNOON AND 3 IN THE EVENING/NEAR BEDTIME   glucose blood (TRUE METRIX BLOOD GLUCOSE TEST) test strip Check blood sugars daily. Dx: E11.9   metFORMIN (GLUCOPHAGE-XR) 500 MG 24 hr tablet TAKE 2 TABLETS BY MOUTH TWICE A DAY WITH MEALS   OVER THE COUNTER MEDICATION Multiple Vitamins-Minerals-Take 1 tablet by mouth daily.   ramipril (ALTACE) 2.5 MG capsule TAKE 1 CAPSULE(2.5 MG) BY MOUTH DAILY   rOPINIRole (REQUIP) 0.25 MG tablet TAKE 1 TABLET BY MOUTH MID MORNING AND 1 TAB IN THE EVENING.   Semaglutide, 1 MG/DOSE, (OZEMPIC, 1 MG/DOSE,) 2 MG/1.5ML SOPN Inject 1 mg into the skin once a week.   sertraline (ZOLOFT) 100 MG tablet Take 1.5 tablets (150 mg total) by mouth daily.   TRESIBA FLEXTOUCH 100 UNIT/ML FlexTouch Pen INJECT 0.3 MLS (30 UNITS TOTAL) INTO THE SKIN DAILY.  TITRATING TO MAX OF 50 UNITS PER DAY (Patient not taking: Reported on 05/06/2020)   No facility-administered encounter medications on file as of 09/05/2020.    Current Diagnosis: Patient Active Problem List   Diagnosis Date Noted   Hyperlipidemia associated with type 2 diabetes mellitus ([place]) 10/11/2017   Hypomagnesemia    S/P cholecystectomy    Choledocholithiasis with chronic cholecystitis    Abnormal LFTs 04/02/2017   Osteopenia 07/16/2016   Vitamin D deficiency 07/16/2016   Postmenopausal estrogen deficiency 06/15/2016   Type 2 diabetes mellitus without complication, without long-term current use of insulin ([place]) 01/29/2015   Anxiety and depression 01/29/2015   Breast cancer screening 01/29/2015   RESTLESS LEG SYNDROME 07/04/2007   MIGRAINE, MENSTRUAL 07/04/2007   PSORIASIS 07/04/2007   History of partial thyroidectomy 07/04/2007    Reviewed chart for medication changes ahead of disease state call.  No OVs, Consults, or hospital visits since last care coordination call/Pharmacist visit.  No medication changes indicated.  Recent Relevant Labs: Lab Results  Component Value Date/Time   HGBA1C 7.9 (H) 04/05/2020 10:41 AM   HGBA1C 7.3 (H) 10/03/2019 11:29 AM   MICROALBUR 1.3 04/11/2018 10:35 AM   MICROALBUR 2.4 (H) 01/29/2015 09:17 AM    Kidney Function Lab Results  Component Value Date/Time   CREATININE 0.85 04/05/2020 10:41 AM   CREATININE 0.75 10/03/2019 11:29 AM   GFR 66.26 04/05/2020 10:41 AM   GFRNONAA >60 04/09/2017 03:42 AM   GFRAA >60 04/09/2017 03:42 AM  Current antihyperglycemic regimen:  o Metformin XR 500 mg twice daily o Ozempic $RemoveB'1mg'[id number]$ /dose inject 1 mg into the skin once weekly o Tresiba 100u/mL inject 0.56mLs into the skin daily   What recent interventions/DTPs have been made to improve glycemic control:  o None noted, patient states she is currently taking her medications as instructed.   Have there been any recent hospitalizations  or ED visits since last visit with CPP? No ,  Patient has not had any recent hospitalizations or ED visits since her last visit with Madelin Rear, CPP.   Patient denies hypoglycemic symptoms, including Pale, Sweaty, Shaky, Hungry, Nervous/irritable and Vision changes    Patient denies hyperglycemic symptoms, including blurry vision, excessive thirst, fatigue, polyuria and weakness    How often are you checking your blood sugar? once daily    What are your blood sugars ranging?  o Fasting: 101-151 - check media for monthly log o Before meals: n/a o After meals: n/a o Bedtime: n/a   During the week, how often does your blood glucose drop below 70? Never    Are you checking your feet daily/regularly?   Adherence Review: Is the patient currently on a STATIN medication? Yes Is the patient currently on ACE/ARB medication? Yes Does the patient have >5 day gap between last estimated fill dates? No   Patient states she is having issues with RLS. Patient is currently taking Gabapentin 100 mg capsules; 2 capsules each morning, 2 capsules in the afternoon and 3 in the evening/near bedtime and Ropinirole 0.25 mg tablet 1 in mid morning and 1 in the evening. Patient would like to know if she can increase these medications as needed when her RLS is "acting up" and "giving her a fit"?   Future Appointments  Date Time Provider [place]  10/03/2020  2:00 PM Midge Minium, MD [facility]    April D Calhoun, [place] Pharmacist Assistant [phone]   Follow-Up:  Pharmacist Review

## 2020-09-05 NOTE — Telephone Encounter (Cosign Needed)
Patient stated she has an appointment to see Dr. Birdie Riddle on 10/03/2020 and will discuss this with her on that day.  April D Calhoun, [place] Pharmacist Assistant [phone]

## 2020-09-05 NOTE — Telephone Encounter (Signed)
-----   Message from April D Calhoun sent at 09/05/2020 11:24 AM EST ----- Patient states she is having issues with RLS. Patient is currently taking Gabapentin 100 mg capsules; 2 capsules each morning, 2 capsules in the afternoon and 3 in the evening/near bedtime and Ropinirole 0.25 mg tablet 1 in mid morning and 1 in the evening. Patient would like to know if she can increase these medications as needed when her RLS is "acting up" and "giving her a fit"?

## 2020-10-03 ENCOUNTER — Ambulatory Visit (INDEPENDENT_AMBULATORY_CARE_PROVIDER_SITE_OTHER): Payer: Medicare Other | Admitting: Family Medicine

## 2020-10-03 ENCOUNTER — Encounter: Payer: Self-pay | Admitting: Family Medicine

## 2020-10-03 ENCOUNTER — Other Ambulatory Visit: Payer: Self-pay

## 2020-10-03 VITALS — BP 84/60 | HR 81 | Temp 98.1°F | Ht 63.0 in | Wt 145.8 lb

## 2020-10-03 DIAGNOSIS — E1169 Type 2 diabetes mellitus with other specified complication: Secondary | ICD-10-CM | POA: Diagnosis not present

## 2020-10-03 DIAGNOSIS — E119 Type 2 diabetes mellitus without complications: Secondary | ICD-10-CM

## 2020-10-03 DIAGNOSIS — E785 Hyperlipidemia, unspecified: Secondary | ICD-10-CM

## 2020-10-03 DIAGNOSIS — I959 Hypotension, unspecified: Secondary | ICD-10-CM | POA: Diagnosis not present

## 2020-10-03 NOTE — Assessment & Plan Note (Signed)
Chronic problem.  Pt is-in fact- on insulin but is attempting to wean off her [place].  She is down 30 lbs since starting the Ozempic in October.  Has not had labs since.  Also on Metformin.  She is UTD on eye exam, foot exam.  Will d/c ACE today due to hypotension.  Check labs.  Adjust meds prn

## 2020-10-03 NOTE — Patient Instructions (Addendum)
Follow up in 3-4 months to recheck diabetes We'll notify you of your lab results and make any changes if needed Keep up the good work!  You look great! STOP the Ramipril Make sure you are drinking LOTS of water and change positions slowly Call with any questions or concerns Stay Safe!  Stay Healthy! Happy Spring!!!

## 2020-10-03 NOTE — Assessment & Plan Note (Signed)
Chronic problem.  On Lipitor 20mg daily w/o difficulty.  Check labs.  Adjust meds prn  

## 2020-10-03 NOTE — Progress Notes (Signed)
   Subjective:    Patient ID: Tonya Tapia, female    DOB: 1951-07-05, 70 y.o.   MRN: [id number]  HPI DM- chronic problem, on Tresiba 9 units daily, Ozempic 1mg  weekly, Metformin XR 1000mg  BID.  AM readings for March run 102-150 w/ 3 outliers above 150.  Denies recent symptomatic lows.  Pt reports she has lost her appetite and subsequently ~30 lbs since starting the Ozempic.  No CP, SOB, HAs, visual changes.  No numbness/tingling of hands/feet.  UTD on eye exam, foot exam, and on ACE for renal protection.  Hyperlipidemia- chronic problem, on Lipitor 20mg  daily.  No abd pain, N/V  Hypotension- pt is currently on Ramipril 2.5mg  daily.  Pt reports she has been feeling dizzy 'for months'  This typically occurs upon standing or changing positions.  No passing out.   Review of Systems For ROS see HPI   This visit occurred during the SARS-CoV-2 public health emergency.  Safety protocols were in place, including screening questions prior to the visit, additional usage of staff PPE, and extensive cleaning of exam room while observing appropriate contact time as indicated for disinfecting solutions.       Objective:   Physical Exam Vitals reviewed.  Constitutional:      General: She is not in acute distress.    Appearance: Normal appearance. She is well-developed. She is not ill-appearing.  HENT:     Head: Normocephalic and atraumatic.  Eyes:     Conjunctiva/sclera: Conjunctivae normal.     Pupils: Pupils are equal, round, and reactive to light.  Neck:     Thyroid: No thyromegaly.  Cardiovascular:     Rate and Rhythm: Normal rate and regular rhythm.     Pulses: Normal pulses.     Heart sounds: Normal heart sounds. No murmur heard.   Pulmonary:     Effort: Pulmonary effort is normal. No respiratory distress.     Breath sounds: Normal breath sounds.  Abdominal:     General: There is no distension.     Palpations: Abdomen is soft.     Tenderness: There is no abdominal tenderness.   Musculoskeletal:     Cervical back: Normal range of motion and neck supple.     Right lower leg: No edema.     Left lower leg: No edema.  Lymphadenopathy:     Cervical: No cervical adenopathy.  Skin:    General: Skin is warm and dry.  Neurological:     Mental Status: She is alert and oriented to person, place, and time.  Psychiatric:        Behavior: Behavior normal.           Assessment & Plan:  Hypotension- new.  Likely due to her recent 30 lb weight loss.  Stop Lisinopril.  Encouraged increased water intake and changing positions slowly.  Pt expressed understanding and is in agreement w/ plan.

## 2020-10-04 ENCOUNTER — Other Ambulatory Visit: Payer: Self-pay

## 2020-10-04 DIAGNOSIS — R7989 Other specified abnormal findings of blood chemistry: Secondary | ICD-10-CM

## 2020-10-04 DIAGNOSIS — R945 Abnormal results of liver function studies: Secondary | ICD-10-CM

## 2020-10-04 LAB — CBC WITH DIFFERENTIAL/PLATELET
Basophils Absolute: 0.1 10*3/uL (ref 0.0–0.1)
Basophils Relative: 1 % (ref 0.0–3.0)
Eosinophils Absolute: 0.4 10*3/uL (ref 0.0–0.7)
Eosinophils Relative: 3.5 % (ref 0.0–5.0)
HCT: 36.7 % (ref 36.0–46.0)
Hemoglobin: 12.2 g/dL (ref 12.0–15.0)
Lymphocytes Relative: 25.2 % (ref 12.0–46.0)
Lymphs Abs: 2.7 10*3/uL (ref 0.7–4.0)
MCHC: 33.2 g/dL (ref 30.0–36.0)
MCV: 86.4 fl (ref 78.0–100.0)
Monocytes Absolute: 0.9 10*3/uL (ref 0.1–1.0)
Monocytes Relative: 8.6 % (ref 3.0–12.0)
Neutro Abs: 6.5 10*3/uL (ref 1.4–7.7)
Neutrophils Relative %: 61.7 % (ref 43.0–77.0)
Platelets: 256 10*3/uL (ref 150.0–400.0)
RBC: 4.24 Mil/uL (ref 3.87–5.11)
RDW: 13 % (ref 11.5–15.5)
WBC: 10.6 10*3/uL — ABNORMAL HIGH (ref 4.0–10.5)

## 2020-10-04 LAB — BASIC METABOLIC PANEL
BUN: 17 mg/dL (ref 6–23)
CO2: 28 mEq/L (ref 19–32)
Calcium: 9.7 mg/dL (ref 8.4–10.5)
Chloride: 103 mEq/L (ref 96–112)
Creatinine, Ser: 0.85 mg/dL (ref 0.40–1.20)
GFR: 69.72 mL/min (ref >60.00)
Glucose, Bld: 105 mg/dL — ABNORMAL HIGH (ref 70–99)
Potassium: 4.2 mEq/L (ref 3.5–5.1)
Sodium: 140 mEq/L (ref 135–145)

## 2020-10-04 LAB — LIPID PANEL
Cholesterol: 118 mg/dL (ref 0–200)
HDL: 45 mg/dL (ref >39.00)
LDL Cholesterol: 55 mg/dL (ref 0–99)
NonHDL: 72.99
Total CHOL/HDL Ratio: 3
Triglycerides: 92 mg/dL (ref 0.0–149.0)
VLDL: 18.4 mg/dL (ref 0.0–40.0)

## 2020-10-04 LAB — HEPATIC FUNCTION PANEL
ALT: 62 U/L — ABNORMAL HIGH (ref 0–35)
AST: 67 U/L — ABNORMAL HIGH (ref 0–37)
Albumin: 4.1 g/dL (ref 3.5–5.2)
Alkaline Phosphatase: 95 U/L (ref 39–117)
Bilirubin, Direct: 0.1 mg/dL (ref 0.0–0.3)
Total Bilirubin: 0.4 mg/dL (ref 0.2–1.2)
Total Protein: 7 g/dL (ref 6.0–8.3)

## 2020-10-04 LAB — TSH: TSH: 1.16 u[IU]/mL (ref 0.35–4.50)

## 2020-10-04 LAB — HEMOGLOBIN A1C: Hgb A1c MFr Bld: 7 % — ABNORMAL HIGH (ref 4.6–6.5)

## 2020-10-17 ENCOUNTER — Telehealth: Payer: Self-pay | Admitting: Family Medicine

## 2020-10-17 ENCOUNTER — Other Ambulatory Visit: Payer: Self-pay

## 2020-10-17 ENCOUNTER — Other Ambulatory Visit (INDEPENDENT_AMBULATORY_CARE_PROVIDER_SITE_OTHER): Payer: Medicare Other

## 2020-10-17 DIAGNOSIS — R945 Abnormal results of liver function studies: Secondary | ICD-10-CM | POA: Diagnosis not present

## 2020-10-17 DIAGNOSIS — R7989 Other specified abnormal findings of blood chemistry: Secondary | ICD-10-CM

## 2020-10-17 LAB — HEPATIC FUNCTION PANEL
ALT: 45 U/L — ABNORMAL HIGH (ref 0–35)
AST: 46 U/L — ABNORMAL HIGH (ref 0–37)
Albumin: 3.7 g/dL (ref 3.5–5.2)
Alkaline Phosphatase: 84 U/L (ref 39–117)
Bilirubin, Direct: 0.1 mg/dL (ref 0.0–0.3)
Total Bilirubin: 0.4 mg/dL (ref 0.2–1.2)
Total Protein: 6.9 g/dL (ref 6.0–8.3)

## 2020-10-17 NOTE — Telephone Encounter (Signed)
Patient is requesting her gabapentin be sent to [place] - states she is going out of town

## 2020-10-21 ENCOUNTER — Other Ambulatory Visit: Payer: Self-pay

## 2020-10-21 DIAGNOSIS — G2581 Restless legs syndrome: Secondary | ICD-10-CM

## 2020-10-21 MED ORDER — GABAPENTIN 100 MG PO CAPS: ORAL_CAPSULE | ORAL | 0 refills | Status: DC

## 2020-10-21 NOTE — Telephone Encounter (Signed)
Rx filled and sent to patient pharmacy 

## 2020-10-23 ENCOUNTER — Telehealth: Payer: Self-pay | Admitting: Family Medicine

## 2020-10-23 NOTE — Telephone Encounter (Signed)
Patient called back to receive her lab results . Patient is going to continue to hold off on alcohol and tylenol.

## 2020-10-23 NOTE — Telephone Encounter (Signed)
Patient would like someone to call her with her results from blood work that she had done last week

## 2020-10-23 NOTE — Telephone Encounter (Signed)
Attempted to give patient a call to discuss lab results LVM for patient to return call.

## 2020-11-29 ENCOUNTER — Telehealth: Payer: Self-pay

## 2020-11-29 NOTE — Chronic Care Management (AMB) (Signed)
Chronic Care Management Pharmacy Assistant   Name: Tonya Tapia  MRN: [id number] DOB: [birth year]  Reason for Encounter: Diabetes Mellitus Disease State Call  Recent office visits:  10/03/20- Tonya Asa, MD- chronic conditions addressed at visit, stopped ramipril, follow up 3-4 months    Recent consult visits:  No visits noted  Hospital visits:  None in previous 6 months  Medications: Outpatient Encounter Medications as of 11/29/2020  Medication Sig  . ALPRAZolam (XANAX) 0.25 MG tablet Take 1 tablet (0.25 mg total) by mouth every 12 (twelve) hours as needed. for anxiety  . atorvastatin (LIPITOR) 20 MG tablet TAKE 1 TABLET BY MOUTH EVERYDAY AT BEDTIME  . BD PEN NEEDLE NANO 2ND GEN 32G X 4 MM MISC USE DAILY AS DIRECTED  . Blood Glucose Monitoring Suppl ([place]) KIT 1 kit by Does not apply route daily. Dx: E11.9  . Calcium Carb-Cholecalciferol (CALCIUM-VITAMIN D) 600-400 MG-UNIT TABS Take 2 each by mouth daily.  Marland Kitchen gabapentin (NEURONTIN) 100 MG capsule TAKE 2 CAPSULES BY MOUTH EACH MORNING, 2 IN EARLY AFTERNOON AND 3 IN THE EVENING/NEAR BEDTIME  . glucose blood (TRUE METRIX BLOOD GLUCOSE TEST) test strip Check blood sugars daily. Dx: E11.9  . metFORMIN (GLUCOPHAGE-XR) 500 MG 24 hr tablet TAKE 2 TABLETS BY MOUTH TWICE A DAY WITH MEALS  . OVER THE COUNTER MEDICATION Multiple Vitamins-Minerals-Take 1 tablet by mouth daily.  . ramipril (ALTACE) 2.5 MG capsule TAKE 1 CAPSULE(2.5 MG) BY MOUTH DAILY  . rOPINIRole (REQUIP) 0.25 MG tablet TAKE 1 TABLET BY MOUTH MID MORNING AND 1 TAB IN THE EVENING.  . Semaglutide, 1 MG/DOSE, (OZEMPIC, 1 MG/DOSE,) 2 MG/1.5ML SOPN Inject 1 mg into the skin once a week.  . sertraline (ZOLOFT) 100 MG tablet Take 1.5 tablets (150 mg total) by mouth daily.  . TRESIBA FLEXTOUCH 100 UNIT/ML FlexTouch Pen INJECT 0.3 MLS (30 UNITS TOTAL) INTO THE SKIN DAILY. TITRATING TO MAX OF 50 UNITS PER DAY   No facility-administered encounter medications  on file as of 11/29/2020.    Recent Relevant Labs: Lab Results  Component Value Date/Time   HGBA1C 7.0 (H) 10/03/2020 02:40 PM   HGBA1C 7.9 (H) 04/05/2020 10:41 AM   MICROALBUR 1.3 04/11/2018 10:35 AM   MICROALBUR 2.4 (H) 01/29/2015 09:17 AM    Kidney Function Lab Results  Component Value Date/Time   CREATININE 0.85 10/03/2020 02:40 PM   CREATININE 0.85 04/05/2020 10:41 AM   GFR 69.72 10/03/2020 02:40 PM   GFRNONAA >60 04/09/2017 03:42 AM   GFRAA >60 04/09/2017 03:42 AM   I spoke with Ms Tonya Tapia this morning and she is doing well overall. She stated that she has moved into her new house and will be making changes to her providers. She states that she will now be at [facility] with a different PCP. For this reason Ms. Tonya Tapia declined a call regarding ccm services.   Current antihyperglycemic regimen: Metformin XR 500 mg twice daily Ozempic $RemoveBefor'1mg'xTbISlsuQMyc$ /dose inject 1 mg into the skin once weekly Tresiba 100u/mL inject 0.33mLs into the skin daily   Adherence Review: Is the patient currently on a STATIN medication? Yes Is the patient currently on ACE/ARB medication? No  Does the patient have >5 day gap between last estimated fill dates? Yes  Metformin XR 500 mg- 90 DS last filled 10/17/20 Ozempic $RemoveBeforeDE'1mg'wQzXrNVgNuJTnsr$ /dose - 28 DS last filled 11/12/20 Tyler Aas 100u/mL 30 DS last filled 09/27/20  Star Rating Drugs: Atorvastatin 20 mg-  90 DS last filled 10/29/20  [employer] CPA,  CMA

## 2020-12-13 ENCOUNTER — Other Ambulatory Visit: Payer: Self-pay

## 2020-12-13 DIAGNOSIS — I959 Hypotension, unspecified: Secondary | ICD-10-CM

## 2020-12-13 MED ORDER — RAMIPRIL 2.5 MG PO CAPS
ORAL_CAPSULE | ORAL | 1 refills | Status: AC
Start: 2020-12-13 — End: ?

## 2021-01-01 ENCOUNTER — Encounter: Payer: Self-pay | Admitting: *Deleted

## 2021-01-08 ENCOUNTER — Other Ambulatory Visit: Payer: Self-pay

## 2021-01-08 DIAGNOSIS — E785 Hyperlipidemia, unspecified: Secondary | ICD-10-CM

## 2021-01-08 DIAGNOSIS — E119 Type 2 diabetes mellitus without complications: Secondary | ICD-10-CM

## 2021-01-08 DIAGNOSIS — E1169 Type 2 diabetes mellitus with other specified complication: Secondary | ICD-10-CM

## 2021-01-08 MED ORDER — METFORMIN HCL ER 500 MG PO TB24: ORAL_TABLET | ORAL | 1 refills | Status: DC

## 2021-01-08 MED ORDER — OZEMPIC (1 MG/DOSE) 2 MG/1.5ML ~~LOC~~ SOPN
1.0000 mg | PEN_INJECTOR | SUBCUTANEOUS | 1 refills | Status: AC
Start: 2021-01-08 — End: ?

## 2021-01-08 MED ORDER — ATORVASTATIN CALCIUM 20 MG PO TABS
ORAL_TABLET | ORAL | 1 refills | Status: DC
Start: 2021-01-08 — End: 2021-07-27

## 2021-01-21 ENCOUNTER — Ambulatory Visit: Payer: Medicare Other | Admitting: Adult Health

## 2021-02-04 ENCOUNTER — Other Ambulatory Visit: Payer: Self-pay | Admitting: Family Medicine

## 2021-02-04 DIAGNOSIS — G2581 Restless legs syndrome: Secondary | ICD-10-CM

## 2021-02-27 ENCOUNTER — Other Ambulatory Visit: Payer: Self-pay | Admitting: Family

## 2021-02-27 DIAGNOSIS — F419 Anxiety disorder, unspecified: Secondary | ICD-10-CM

## 2021-02-27 DIAGNOSIS — F32A Depression, unspecified: Secondary | ICD-10-CM

## 2021-03-03 ENCOUNTER — Other Ambulatory Visit: Payer: Self-pay

## 2021-03-03 ENCOUNTER — Encounter: Payer: Self-pay | Admitting: Family Medicine

## 2021-03-03 DIAGNOSIS — F419 Anxiety disorder, unspecified: Secondary | ICD-10-CM

## 2021-03-03 DIAGNOSIS — F32A Depression, unspecified: Secondary | ICD-10-CM

## 2021-03-03 MED ORDER — SERTRALINE HCL 100 MG PO TABS: 150.0000 mg | ORAL_TABLET | Freq: Every day | ORAL | 0 refills | Status: DC

## 2021-03-13 ENCOUNTER — Ambulatory Visit: Payer: Medicare Other | Admitting: Adult Health

## 2021-03-24 ENCOUNTER — Telehealth: Payer: Self-pay | Admitting: Family Medicine

## 2021-03-24 NOTE — Telephone Encounter (Signed)
I spoke with patient to schedule AWV.  She stated she changed providers.  She is at [place] now

## 2021-04-22 ENCOUNTER — Other Ambulatory Visit: Payer: Self-pay | Admitting: Family Medicine

## 2021-04-22 DIAGNOSIS — E785 Hyperlipidemia, unspecified: Secondary | ICD-10-CM

## 2021-04-22 DIAGNOSIS — E1169 Type 2 diabetes mellitus with other specified complication: Secondary | ICD-10-CM

## 2021-04-24 ENCOUNTER — Other Ambulatory Visit: Payer: Self-pay | Admitting: Family Medicine

## 2021-04-24 DIAGNOSIS — E1169 Type 2 diabetes mellitus with other specified complication: Secondary | ICD-10-CM

## 2021-05-06 ENCOUNTER — Ambulatory Visit: Payer: Medicare Other | Admitting: Adult Health

## 2021-06-17 ENCOUNTER — Other Ambulatory Visit: Payer: Self-pay | Admitting: Family Medicine

## 2021-06-17 DIAGNOSIS — F32A Depression, unspecified: Secondary | ICD-10-CM

## 2021-06-17 DIAGNOSIS — F419 Anxiety disorder, unspecified: Secondary | ICD-10-CM

## 2021-06-19 ENCOUNTER — Other Ambulatory Visit: Payer: Self-pay | Admitting: Family Medicine

## 2021-06-19 DIAGNOSIS — G2581 Restless legs syndrome: Secondary | ICD-10-CM

## 2021-07-26 ENCOUNTER — Other Ambulatory Visit: Payer: Self-pay | Admitting: Family Medicine

## 2021-07-26 DIAGNOSIS — E1169 Type 2 diabetes mellitus with other specified complication: Secondary | ICD-10-CM

## 2021-08-13 ENCOUNTER — Other Ambulatory Visit: Payer: Self-pay | Admitting: Internal Medicine

## 2021-08-13 DIAGNOSIS — R5381 Other malaise: Secondary | ICD-10-CM

## 2021-08-13 DIAGNOSIS — Z1231 Encounter for screening mammogram for malignant neoplasm of breast: Secondary | ICD-10-CM

## 2021-08-21 LAB — HM COLONOSCOPY

## 2021-09-03 ENCOUNTER — Encounter: Payer: Self-pay | Admitting: Family Medicine

## 2021-09-05 ENCOUNTER — Other Ambulatory Visit: Payer: Self-pay | Admitting: Family Medicine

## 2022-06-03 ENCOUNTER — Telehealth: Payer: Self-pay

## 2022-06-03 NOTE — Telephone Encounter (Signed)
Transition Care Management Unsuccessful Follow-up Telephone Call  Date of discharge and from where:  [facility] 06-02-22 Dx: chest pain   Attempts:  1st Attempt  Reason for unsuccessful TCM follow-up call:  Left voice message   Juanda Crumble LPN [place] Direct Dial [phone]

## 2022-06-04 NOTE — Telephone Encounter (Signed)
Transition Care Management Unsuccessful Follow-up Telephone Call  Date of discharge and from where:  [facility] 06-02-22 Dx: chest pain    Attempts:  3rd Attempt  Reason for unsuccessful TCM follow-up call:  Left voice message   Juanda Crumble LPN [employer] Direct Dial [phone]'

## 2022-06-04 NOTE — Telephone Encounter (Signed)
Transition Care Management Unsuccessful Follow-up Telephone Call  Date of discharge and from where:    [facility] 06-02-22 Dx: chest pain     Attempts:  2nd Attempt  Reason for unsuccessful TCM follow-up call:  Left voice message   Juanda Crumble LPN [place] Direct Dial [phone]

## 2023-06-25 NOTE — Telephone Encounter (Signed)
PCP updated.
